# Patient Record
Sex: Male | Born: 1940 | Race: White | Hispanic: No | Marital: Married | Smoking: Former smoker
Health system: Southern US, Community
[De-identification: ages and names within clinical notes are randomized; demographics above are authoritative.]

## PROBLEM LIST (undated history)

## (undated) DIAGNOSIS — I499 Cardiac arrhythmia, unspecified: Secondary | ICD-10-CM

## (undated) DIAGNOSIS — E785 Hyperlipidemia, unspecified: Secondary | ICD-10-CM

## (undated) DIAGNOSIS — G4730 Sleep apnea, unspecified: Secondary | ICD-10-CM

## (undated) DIAGNOSIS — N400 Enlarged prostate without lower urinary tract symptoms: Secondary | ICD-10-CM

## (undated) DIAGNOSIS — Z8709 Personal history of other diseases of the respiratory system: Secondary | ICD-10-CM

## (undated) DIAGNOSIS — E119 Type 2 diabetes mellitus without complications: Secondary | ICD-10-CM

## (undated) DIAGNOSIS — R51 Headache: Secondary | ICD-10-CM

## (undated) DIAGNOSIS — I10 Essential (primary) hypertension: Secondary | ICD-10-CM

## (undated) HISTORY — DX: Hyperlipidemia, unspecified: E78.5

## (undated) HISTORY — PX: KNEE ARTHROSCOPY: SUR90

## (undated) HISTORY — PX: VENTRAL HERNIA REPAIR: SHX424

## (undated) HISTORY — PX: TONSILLECTOMY: SUR1361

## (undated) HISTORY — DX: Essential (primary) hypertension: I10

## (undated) HISTORY — DX: Type 2 diabetes mellitus without complications: E11.9

---

## 1966-03-17 HISTORY — PX: APPENDECTOMY: SHX54

## 1970-03-17 HISTORY — PX: NASOPHARYNGOSCOPY: SHX5210

## 1997-08-09 ENCOUNTER — Ambulatory Visit (HOSPITAL_COMMUNITY): Admission: RE | Admit: 1997-08-09 | Discharge: 1997-08-09 | Payer: Self-pay | Admitting: Interventional Cardiology

## 1997-09-06 ENCOUNTER — Ambulatory Visit (HOSPITAL_COMMUNITY): Admission: RE | Admit: 1997-09-06 | Discharge: 1997-09-06 | Payer: Self-pay | Admitting: Cardiovascular Disease

## 1997-09-06 HISTORY — PX: CARDIAC CATHETERIZATION: SHX172

## 1998-07-11 ENCOUNTER — Encounter: Payer: Self-pay | Admitting: Urology

## 1998-07-11 ENCOUNTER — Ambulatory Visit (HOSPITAL_COMMUNITY): Admission: RE | Admit: 1998-07-11 | Discharge: 1998-07-11 | Payer: Self-pay | Admitting: Urology

## 2004-04-01 ENCOUNTER — Encounter: Admission: RE | Admit: 2004-04-01 | Discharge: 2004-04-01 | Payer: Self-pay | Admitting: General Surgery

## 2004-04-02 ENCOUNTER — Ambulatory Visit (HOSPITAL_COMMUNITY): Admission: RE | Admit: 2004-04-02 | Discharge: 2004-04-02 | Payer: Self-pay | Admitting: General Surgery

## 2004-04-02 ENCOUNTER — Encounter (INDEPENDENT_AMBULATORY_CARE_PROVIDER_SITE_OTHER): Payer: Self-pay | Admitting: *Deleted

## 2004-04-02 ENCOUNTER — Ambulatory Visit (HOSPITAL_BASED_OUTPATIENT_CLINIC_OR_DEPARTMENT_OTHER): Admission: RE | Admit: 2004-04-02 | Discharge: 2004-04-02 | Payer: Self-pay | Admitting: General Surgery

## 2005-02-19 ENCOUNTER — Ambulatory Visit (HOSPITAL_BASED_OUTPATIENT_CLINIC_OR_DEPARTMENT_OTHER): Admission: RE | Admit: 2005-02-19 | Discharge: 2005-02-19 | Payer: Self-pay | Admitting: Plastic Surgery

## 2005-02-19 ENCOUNTER — Encounter (INDEPENDENT_AMBULATORY_CARE_PROVIDER_SITE_OTHER): Payer: Self-pay | Admitting: *Deleted

## 2005-10-29 ENCOUNTER — Encounter: Admission: RE | Admit: 2005-10-29 | Discharge: 2005-10-29 | Payer: Self-pay | Admitting: Gastroenterology

## 2006-03-17 HISTORY — PX: NM MYOCAR PERF WALL MOTION: HXRAD629

## 2006-03-17 HISTORY — PX: TRANSTHORACIC ECHOCARDIOGRAM: SHX275

## 2007-10-14 ENCOUNTER — Encounter: Admission: RE | Admit: 2007-10-14 | Discharge: 2007-10-14 | Payer: Self-pay | Admitting: Orthopedic Surgery

## 2010-08-02 NOTE — Op Note (Signed)
[name], [name] NO.:  [id number]   MEDICAL RECORD NO.:  [id number]          PATIENT TYPE:  AMB   LOCATION:  DSC                          FACILITY:  [facility]   PHYSICIAN:  Anselm Pancoast. Weatherly, M.D.DATE OF BIRTH:  10/19/1940   DATE OF PROCEDURE:  04/02/2004  DATE OF DISCHARGE:                                 OPERATIVE REPORT   PREOPERATIVE DIAGNOSIS:  Ventral hernia.   OPERATION:  Repair of ventral hernia with mesh, preperitoneal space.  General anesthesia.   SURGEON:  Anselm Pancoast. Zachery Dakins, M.D.   HISTORY:  Victor Sanchez is a 70 year old male who for probably about five or six  years has had a progressive bulge above the umbilicus.  He has had a  previous umbilical hernia repaired years ago, and I have seen him, and we  have had scheduled several times, but for weddings and other personal  reasons he has postponed.  The hernia has gradually gotten larger, so now it  is an orange-size mass above the umbilicus.  He weighs about 240 pounds.  He  had a previous colonoscopy, and I recommended that we repair this with a  vertical incision, a piece of Prolene mesh placed within the preperitoneal  space, and he is in agreement.   Preoperatively, he was given 1 g of Kefzol and taken to the operative suite.  Induction of general anesthesia, endotracheal tube.  The abdomen was clipped  and then prepped with Betadine surgical scrub and solution and draped in a  sterile manner.  A vertical incision about 4 inches in length was made.  The  hernia sac was up into the subcutaneous tissue, and I separated the hernia  sac from the surrounding adipose tissue circumferentially.  The hernia was  kind of going to the right side, but the actual defect is midline, located  starting about an inch above the umbilicus.  I opened the hernia sac, and it  was kind of a narrow neck.  It did have colon up in it and also a large wad  of omentum, and I reduced part of it, but some of the  omentum that was very  boggy I just removed it.  The little pedicles were tied with 2-0 Vicryl.  Part of the omentum where I removed went very close to the transverse colon  that was kind of up into the hernia sac, and this was placed back and  carefully handled when the little pedicles were ligated.  Next, the  peritoneum was closed, and this was with 2-0 Vicryl.  The fascia at the  midline is just very thinned out, and I developed a plane between the fascia  and the peritoneum.  It was necessary to take the mesh up below the  umbilicus just so I had a good anchor, and I took down where I had actually  closed the peritoneum during the herniorrhaphy years ago so that the mesh  could go down below the umbilicus with the lower sutures.  A piece of mesh  about 3 x about 5 inches in length was  used, and I anchored it in both  superiorly and inferiorly and laterally about every 2 inches with one  Novofil suture using a bigger needle so I could go through the abdominal  wall but actually using it in the subcutaneous, kind of creating a little  pocket, creating the little area to operate through, picking up the mesh,  going back and tying the knots at the end.  The mesh was lying flat,  anchored.  I had opened the peritoneum just a little bit at the umbilicus,  and all of the actual peritoneum areas had been closed so that there was  good peritoneum between the intestines and the patient's mesh.  Next, the  ventral area was closed with interrupted #1 Novofil, picking up just a  little bit of the midline mesh to anchor the area completely.  There was  good hemostasis.  I had washed out, and also put about 20 cc of 0.5%  Marcaine in the abdominal wall to minimize the immediate postoperative pain.  The subcutaneous tissue was closed with 3-0 Vicryl, and then the skin was  closed with staples.  The patient tolerated the procedure nicely.  I did  place an abdominal binder on him, and I will see him   back in the office in the early part of next week to remove the skin  staples.  He will be advised not to do any real strenuous activity for  approximately three or four weeks, hopefully can be driving in three or four  days, and will use Tylox for pain.       WJW/MEDQ  D:  04/02/2004  T:  04/02/2004  Job:  [id number]   cc:   Anselm Pancoast. Zachery Dakins, M.D.  [address], [address]  [place]  [place] [address]  Fax: [phone]   Tasia Catchings, M.D.  [address]  [place]  [place] [address]  Fax: [phone]

## 2011-02-26 ENCOUNTER — Other Ambulatory Visit: Payer: Self-pay | Admitting: Dermatology

## 2011-06-10 ENCOUNTER — Encounter (HOSPITAL_BASED_OUTPATIENT_CLINIC_OR_DEPARTMENT_OTHER): Payer: Self-pay

## 2011-06-25 ENCOUNTER — Ambulatory Visit (HOSPITAL_BASED_OUTPATIENT_CLINIC_OR_DEPARTMENT_OTHER): Payer: BC Managed Care – PPO | Attending: Otolaryngology | Admitting: Radiology

## 2011-06-25 VITALS — Ht 73.0 in | Wt 225.0 lb

## 2011-06-25 DIAGNOSIS — G4733 Obstructive sleep apnea (adult) (pediatric): Secondary | ICD-10-CM | POA: Insufficient documentation

## 2011-06-25 DIAGNOSIS — R0683 Snoring: Secondary | ICD-10-CM

## 2011-06-25 DIAGNOSIS — J342 Deviated nasal septum: Secondary | ICD-10-CM

## 2011-06-29 DIAGNOSIS — G4733 Obstructive sleep apnea (adult) (pediatric): Secondary | ICD-10-CM

## 2011-06-29 NOTE — Procedures (Signed)
Victor Sanchez, STICK NO.:  [id number]  MEDICAL RECORD NO.:  [id number]          PATIENT TYPE:  OUT  LOCATION:  SLEEP CENTER                 FACILITY:  [facility]  PHYSICIAN:  Maxwell Lemen D. Maple Hudson, MD, FCCP, FACPDATE OF BIRTH:  [birth year] 02, 1940  DATE OF STUDY:  06/25/2011                           NOCTURNAL POLYSOMNOGRAM  REFERRING PHYSICIAN:  Zola Button T. Lazarus Salines, M.D.  REFERRING DOCTOR:  Zola Button T. Wolicki, MD.  INDICATION FOR STUDY:  Hypersomnia with sleep apnea.  EPWORTH SLEEPINESS SCORE:  4/24.  BMI 29.7, weight 225 pounds, height 73 inches, neck 17.5 inches.  MEDICATIONS:  Charted and reviewed.  SLEEP ARCHITECTURE:  Split study protocol.  During the diagnostic phase, total sleep time 131 minute with sleep efficiency 69.9%.  Stage I was 13.7%, stage II 76%, stage III absent, REM 10.3% of total sleep time. Sleep latency 35.5 minutes, REM latency 131.5 minutes, wake after sleep onset 18.5 minutes, arousal index 22.4.  BEDTIME MEDICATION:  Advil PM.  RESPIRATORY DATA:  Split study protocol.  Apnea/hypopnea index (AHI) 21.1 per hour.  A total of 46 events were scored including 5 obstructive apneas and 41 hypopneas.  Events were associated with nonsupine sleep position.  REM AHI 31.1 per hour.  CPAP was titrated to 6 CWP, AHI 1 per hour.  He wore a large ResMed Mirage Quattro full-face mask with heated humidifier and a C-flex setting of 3.  OXYGEN DATA:  Mild snoring before CPAP with oxygen desaturation to a nadir of 80% on room air.  With CPAP titration, snoring was prevented and mean oxygen saturation held 94.7% on room air.  CARDIAC DATA:  Sinus rhythm with PACs and PVCs.  MOVEMENT-PARASOMNIA:  No significant movement disturbance. Bathroom x2.  IMPRESSIONS-RECOMMENDATIONS: 1. Moderate obstructive sleep apnea/hypopnea syndrome, apnea/hypopnea     index 21.1 per hour with nonsupine events.  Mild snoring with     oxygen desaturation to a nadir of 80% on room air. 2.  Successful continuous positive airway pressure titration to 6     centimeters of water pressure, apnea/hypopnea     index 1 per hour.  He wore a large ResMed Mirage Quattro full-face     mask with heated humidifier and a C-flex setting of 3.  Snoring was     prevented and mean oxygen saturation held 94.7% on room air.     Symone Cornman D. Maple Hudson, MD, [facility], FACP Diplomate, American Board of Sleep Medicine    CDY/MEDQ  D:  06/29/2011 09:03:00  T:  06/29/2011 09:25:08  Job:  [id number]

## 2012-06-01 ENCOUNTER — Other Ambulatory Visit: Payer: Self-pay | Admitting: Dermatology

## 2012-12-30 ENCOUNTER — Other Ambulatory Visit: Payer: Self-pay | Admitting: Gastroenterology

## 2013-05-12 ENCOUNTER — Encounter (HOSPITAL_COMMUNITY): Payer: Self-pay | Admitting: Pharmacy Technician

## 2013-05-17 ENCOUNTER — Ambulatory Visit (INDEPENDENT_AMBULATORY_CARE_PROVIDER_SITE_OTHER): Payer: BC Managed Care – PPO | Admitting: *Deleted

## 2013-05-17 ENCOUNTER — Telehealth: Payer: Self-pay | Admitting: Cardiovascular Disease

## 2013-05-17 ENCOUNTER — Other Ambulatory Visit (HOSPITAL_COMMUNITY): Payer: BC Managed Care – PPO

## 2013-05-17 VITALS — HR 70

## 2013-05-17 DIAGNOSIS — I499 Cardiac arrhythmia, unspecified: Secondary | ICD-10-CM

## 2013-05-17 NOTE — Patient Instructions (Signed)
Dr Royann Shiversroitoru recommends that you schedule a follow-up appointment with Dr Tresa EndoKelly.

## 2013-05-17 NOTE — Telephone Encounter (Signed)
Cardiac rhythm was abnormal on exam when he saw Dr. Lazarus SalinesWolicki. Normal ECG in office today. Consider f/u with Dr. Tresa EndoKelly, maybe event monitor.

## 2013-05-18 ENCOUNTER — Encounter (HOSPITAL_COMMUNITY): Payer: Self-pay

## 2013-05-18 ENCOUNTER — Encounter (HOSPITAL_COMMUNITY)
Admission: RE | Admit: 2013-05-18 | Discharge: 2013-05-18 | Disposition: A | Discharge status: Home / Self Care | Payer: BC Managed Care – PPO | Source: Ambulatory Visit | Attending: Otolaryngology | Admitting: Otolaryngology

## 2013-05-18 DIAGNOSIS — Z01818 Encounter for other preprocedural examination: Secondary | ICD-10-CM | POA: Insufficient documentation

## 2013-05-18 HISTORY — DX: Personal history of other diseases of the respiratory system: Z87.09

## 2013-05-18 HISTORY — DX: Headache: R51

## 2013-05-18 HISTORY — DX: Sleep apnea, unspecified: G47.30

## 2013-05-18 HISTORY — DX: Benign prostatic hyperplasia without lower urinary tract symptoms: N40.0

## 2013-05-18 LAB — COMPREHENSIVE METABOLIC PANEL
ALT: 48 U/L (ref 0–53)
AST: 30 U/L (ref 0–37)
Albumin: 3.8 g/dL (ref 3.5–5.2)
Alkaline Phosphatase: 52 U/L (ref 39–117)
BUN: 17 mg/dL (ref 6–23)
CO2: 24 mEq/L (ref 19–32)
Calcium: 9.4 mg/dL (ref 8.4–10.5)
Chloride: 99 mEq/L (ref 96–112)
Creatinine, Ser: 0.76 mg/dL (ref 0.50–1.35)
GFR calc Af Amer: >90 mL/min (ref >90)
GFR calc non Af Amer: 89 mL/min — ABNORMAL LOW (ref >90)
Glucose, Bld: 202 mg/dL — ABNORMAL HIGH (ref 70–99)
POTASSIUM: 4.6 meq/L (ref 3.7–5.3)
SODIUM: 138 meq/L (ref 137–147)
TOTAL PROTEIN: 7.4 g/dL (ref 6.0–8.3)
Total Bilirubin: 0.3 mg/dL (ref 0.3–1.2)

## 2013-05-18 LAB — CBC
HCT: 42.4 % (ref 39.0–52.0)
Hemoglobin: 14.9 g/dL (ref 13.0–17.0)
MCH: 30.7 pg (ref 26.0–34.0)
MCHC: 35.1 g/dL (ref 30.0–36.0)
MCV: 87.4 fL (ref 78.0–100.0)
Platelets: 235 10*3/uL (ref 150–400)
RBC: 4.85 MIL/uL (ref 4.22–5.81)
RDW: 13.7 % (ref 11.5–15.5)
WBC: 8.5 10*3/uL (ref 4.0–10.5)

## 2013-05-18 NOTE — Pre-Procedure Instructions (Signed)
Victor PhenixJohn E Maslanka  05/18/2013   Your procedure is scheduled on:  Wednesday, March 11th   [facility] 3 at  6:30  AM.  Call this number if you have problems the morning of surgery: [phone]   Remember:   Do not eat food or drink liquids after midnight Tuesday.   Take these medicines the morning of surgery with A SIP OF WATER: Flomax   Do not wear jewelry-no rings or watches.  Do not wear lotions or colognes.  You may NOT wear deodorant.   Men may shave face and neck.   Do not bring valuables to the hospital.  [facility] any belongings or valuables.               Contacts, dentures or bridgework may not be worn into surgery.  Leave suitcase in the car. After surgery it may be brought to your room.  For patients admitted to the hospital, discharge time is determined by your treatment team.              Name and phone number of your driver:    Special Instructions:  "Preparing for Surgery Instructions"   Please read over the following fact sheets that you were given: Pain Booklet, Coughing and Deep Breathing and Surgical Site Infection Prevention

## 2013-05-18 NOTE — Telephone Encounter (Signed)
Patient notified yesterday of EKG done in the office.  Appt w/Dr Tresa EndoKelly 4/15 and will call if any problems between now and then.  Patient voiced understanding.

## 2013-05-18 NOTE — Progress Notes (Signed)
Left message with Amy at Dr. Raye SorrowWolicki's office and requested orders.

## 2013-05-25 ENCOUNTER — Encounter (HOSPITAL_COMMUNITY): Payer: BC Managed Care – PPO | Admitting: Anesthesiology

## 2013-05-25 ENCOUNTER — Encounter (HOSPITAL_COMMUNITY): Payer: Self-pay | Admitting: *Deleted

## 2013-05-25 ENCOUNTER — Observation Stay (HOSPITAL_COMMUNITY)
Admission: RE | Admit: 2013-05-25 | Discharge: 2013-05-26 | Disposition: A | Discharge status: Home / Self Care | Payer: BC Managed Care – PPO | Source: Ambulatory Visit | Attending: Otolaryngology | Admitting: Otolaryngology

## 2013-05-25 ENCOUNTER — Encounter (HOSPITAL_COMMUNITY)
Admission: RE | Disposition: A | Discharge status: Home / Self Care | Payer: Self-pay | Source: Ambulatory Visit | Attending: Otolaryngology

## 2013-05-25 ENCOUNTER — Other Ambulatory Visit: Payer: Self-pay | Admitting: Otolaryngology

## 2013-05-25 ENCOUNTER — Ambulatory Visit (HOSPITAL_COMMUNITY): Payer: BC Managed Care – PPO | Admitting: Anesthesiology

## 2013-05-25 DIAGNOSIS — J45909 Unspecified asthma, uncomplicated: Secondary | ICD-10-CM | POA: Insufficient documentation

## 2013-05-25 DIAGNOSIS — J342 Deviated nasal septum: Principal | ICD-10-CM | POA: Diagnosis present

## 2013-05-25 DIAGNOSIS — Z87891 Personal history of nicotine dependence: Secondary | ICD-10-CM | POA: Insufficient documentation

## 2013-05-25 DIAGNOSIS — J343 Hypertrophy of nasal turbinates: Secondary | ICD-10-CM | POA: Insufficient documentation

## 2013-05-25 DIAGNOSIS — R51 Headache: Secondary | ICD-10-CM | POA: Insufficient documentation

## 2013-05-25 DIAGNOSIS — G4733 Obstructive sleep apnea (adult) (pediatric): Secondary | ICD-10-CM | POA: Diagnosis present

## 2013-05-25 HISTORY — PX: NASAL SEPTOPLASTY W/ TURBINOPLASTY: SHX2070

## 2013-05-25 HISTORY — PX: UVULECTOMY: SHX2631

## 2013-05-25 SURGERY — SEPTOPLASTY, NOSE, WITH NASAL TURBINATE REDUCTION
Anesthesia: General | Site: Nose

## 2013-05-25 MED ORDER — LIDOCAINE-EPINEPHRINE 1 %-1:100000 IJ SOLN
INTRAMUSCULAR | Status: AC
  Filled 2013-05-25: qty 1

## 2013-05-25 MED ORDER — 0.9 % SODIUM CHLORIDE (POUR BTL) OPTIME
TOPICAL | Status: DC | PRN
  Administered 2013-05-25: 1000 mL

## 2013-05-25 MED ORDER — BACITRACIN ZINC 500 UNIT/GM EX OINT
TOPICAL_OINTMENT | CUTANEOUS | Status: AC
  Filled 2013-05-25: qty 15

## 2013-05-25 MED ORDER — BACITRACIN ZINC 500 UNIT/GM EX OINT
1.0000 "application " | TOPICAL_OINTMENT | Freq: Three times a day (TID) | CUTANEOUS | Status: DC
  Filled 2013-05-25: qty 28.35

## 2013-05-25 MED ORDER — EPHEDRINE SULFATE 50 MG/ML IJ SOLN
INTRAMUSCULAR | Status: AC
  Filled 2013-05-25: qty 1

## 2013-05-25 MED ORDER — LIDOCAINE HCL (CARDIAC) 20 MG/ML IV SOLN
INTRAVENOUS | Status: AC
  Filled 2013-05-25: qty 5

## 2013-05-25 MED ORDER — NEOSTIGMINE METHYLSULFATE 1 MG/ML IJ SOLN
INTRAMUSCULAR | Status: AC
  Filled 2013-05-25: qty 10

## 2013-05-25 MED ORDER — ALBUMIN HUMAN 5 % IV SOLN
INTRAVENOUS | Status: DC | PRN
  Administered 2013-05-25 (×2): via INTRAVENOUS

## 2013-05-25 MED ORDER — PHENYLEPHRINE 40 MCG/ML (10ML) SYRINGE FOR IV PUSH (FOR BLOOD PRESSURE SUPPORT)
PREFILLED_SYRINGE | INTRAVENOUS | Status: AC
  Filled 2013-05-25: qty 10

## 2013-05-25 MED ORDER — OXYMETAZOLINE HCL 0.05 % NA SOLN
NASAL | Status: DC | PRN
  Administered 2013-05-25: 1 via NASAL

## 2013-05-25 MED ORDER — LIDOCAINE HCL (CARDIAC) 20 MG/ML IV SOLN
INTRAVENOUS | Status: DC | PRN
  Administered 2013-05-25: 60 mg via INTRAVENOUS

## 2013-05-25 MED ORDER — MIDAZOLAM HCL 5 MG/5ML IJ SOLN
INTRAMUSCULAR | Status: DC | PRN
  Administered 2013-05-25 (×2): 1 mg via INTRAVENOUS

## 2013-05-25 MED ORDER — OXYMETAZOLINE HCL 0.05 % NA SOLN
2.0000 | NASAL | Status: AC
  Administered 2013-05-25 (×2): 2 via NASAL
  Filled 2013-05-25: qty 15

## 2013-05-25 MED ORDER — GLYCOPYRROLATE 0.2 MG/ML IJ SOLN
INTRAMUSCULAR | Status: DC | PRN
  Administered 2013-05-25: 0.4 mg via INTRAVENOUS

## 2013-05-25 MED ORDER — DUTASTERIDE 0.5 MG PO CAPS: 0.5000 mg | ORAL_CAPSULE | Freq: Every day | ORAL | Status: DC

## 2013-05-25 MED ORDER — ARTIFICIAL TEARS OP OINT
TOPICAL_OINTMENT | OPHTHALMIC | Status: AC
  Filled 2013-05-25: qty 3.5

## 2013-05-25 MED ORDER — CEFAZOLIN SODIUM 1-5 GM-% IV SOLN
1.0000 g | Freq: Three times a day (TID) | INTRAVENOUS | Status: DC
  Administered 2013-05-25 – 2013-05-26 (×3): 1 g via INTRAVENOUS
  Filled 2013-05-25 (×4): qty 50

## 2013-05-25 MED ORDER — ARTIFICIAL TEARS OP OINT
TOPICAL_OINTMENT | OPHTHALMIC | Status: DC | PRN
  Administered 2013-05-25: 1 via OPHTHALMIC

## 2013-05-25 MED ORDER — MORPHINE SULFATE 2 MG/ML IJ SOLN
INTRAMUSCULAR | Status: AC
  Administered 2013-05-25: 2 mg via INTRAVENOUS
  Filled 2013-05-25: qty 1

## 2013-05-25 MED ORDER — PHENYLEPHRINE HCL 10 MG/ML IJ SOLN
INTRAMUSCULAR | Status: DC | PRN
  Administered 2013-05-25 (×5): 80 ug via INTRAVENOUS

## 2013-05-25 MED ORDER — LIDOCAINE-EPINEPHRINE 1 %-1:100000 IJ SOLN
INTRAMUSCULAR | Status: DC | PRN
  Administered 2013-05-25: 20 mL

## 2013-05-25 MED ORDER — MIDAZOLAM HCL 2 MG/2ML IJ SOLN
INTRAMUSCULAR | Status: AC
  Filled 2013-05-25: qty 2

## 2013-05-25 MED ORDER — STERILE WATER FOR INJECTION IJ SOLN
INTRAMUSCULAR | Status: AC
  Filled 2013-05-25: qty 10

## 2013-05-25 MED ORDER — ONDANSETRON HCL 4 MG/2ML IJ SOLN
INTRAMUSCULAR | Status: DC | PRN
  Administered 2013-05-25: 4 mg via INTRAVENOUS

## 2013-05-25 MED ORDER — FENTANYL CITRATE 0.05 MG/ML IJ SOLN
INTRAMUSCULAR | Status: DC | PRN
  Administered 2013-05-25 (×5): 50 ug via INTRAVENOUS

## 2013-05-25 MED ORDER — ONDANSETRON HCL 4 MG/2ML IJ SOLN
INTRAMUSCULAR | Status: AC
  Filled 2013-05-25: qty 2

## 2013-05-25 MED ORDER — FINASTERIDE 5 MG PO TABS
5.0000 mg | ORAL_TABLET | Freq: Every day | ORAL | Status: DC
  Filled 2013-05-25 (×2): qty 1

## 2013-05-25 MED ORDER — ROCURONIUM BROMIDE 100 MG/10ML IV SOLN
INTRAVENOUS | Status: DC | PRN
Start: 2013-05-25 — End: 2013-05-25
  Administered 2013-05-25: 50 mg via INTRAVENOUS

## 2013-05-25 MED ORDER — OXYMETAZOLINE HCL 0.05 % NA SOLN
NASAL | Status: AC
  Filled 2013-05-25: qty 15

## 2013-05-25 MED ORDER — PROPOFOL 10 MG/ML IV BOLUS
INTRAVENOUS | Status: AC
  Filled 2013-05-25: qty 20

## 2013-05-25 MED ORDER — PROPOFOL 10 MG/ML IV BOLUS
INTRAVENOUS | Status: DC | PRN
  Administered 2013-05-25 (×2): 40 mg via INTRAVENOUS
  Administered 2013-05-25: 200 mg via INTRAVENOUS

## 2013-05-25 MED ORDER — LACTATED RINGERS IV SOLN
INTRAVENOUS | Status: DC | PRN
  Administered 2013-05-25 (×2): via INTRAVENOUS

## 2013-05-25 MED ORDER — GLYCOPYRROLATE 0.2 MG/ML IJ SOLN
INTRAMUSCULAR | Status: AC
  Filled 2013-05-25: qty 3

## 2013-05-25 MED ORDER — ACETAMINOPHEN 160 MG/5ML PO SOLN: 325.0000 mg | ORAL | Status: DC | PRN

## 2013-05-25 MED ORDER — PHENYLEPHRINE HCL 10 MG/ML IJ SOLN
10.0000 mg | INTRAVENOUS | Status: DC | PRN
  Administered 2013-05-25: 30 ug/min via INTRAVENOUS

## 2013-05-25 MED ORDER — FENTANYL CITRATE 0.05 MG/ML IJ SOLN
INTRAMUSCULAR | Status: AC
  Filled 2013-05-25: qty 5

## 2013-05-25 MED ORDER — ONDANSETRON HCL 4 MG/2ML IJ SOLN: 4.0000 mg | INTRAMUSCULAR | Status: DC | PRN

## 2013-05-25 MED ORDER — FENTANYL CITRATE 0.05 MG/ML IJ SOLN
INTRAMUSCULAR | Status: AC
  Filled 2013-05-25: qty 2

## 2013-05-25 MED ORDER — MORPHINE SULFATE 2 MG/ML IJ SOLN
2.0000 mg | INTRAMUSCULAR | Status: DC | PRN
  Administered 2013-05-25 (×2): 2 mg via INTRAVENOUS
  Filled 2013-05-25: qty 1

## 2013-05-25 MED ORDER — LIDOCAINE HCL 4 % MT SOLN
OROMUCOSAL | Status: DC | PRN
  Administered 2013-05-25: 4 mL via TOPICAL

## 2013-05-25 MED ORDER — CEFAZOLIN SODIUM-DEXTROSE 2-3 GM-% IV SOLR
2.0000 g | INTRAVENOUS | Status: AC
Start: 2013-05-25 — End: 2013-05-25
  Administered 2013-05-25: 2 g via INTRAVENOUS
  Filled 2013-05-25: qty 50

## 2013-05-25 MED ORDER — TAMSULOSIN HCL 0.4 MG PO CAPS
0.4000 mg | ORAL_CAPSULE | Freq: Every day | ORAL | Status: DC
  Filled 2013-05-25 (×2): qty 1

## 2013-05-25 MED ORDER — OXYCODONE HCL 5 MG/5ML PO SOLN
5.0000 mg | ORAL | Status: DC | PRN
  Administered 2013-05-25 – 2013-05-26 (×4): 10 mg via ORAL
  Filled 2013-05-25 (×2): qty 10
  Filled 2013-05-25: qty 50
  Filled 2013-05-25: qty 10

## 2013-05-25 MED ORDER — ONDANSETRON HCL 4 MG PO TABS: 4.0000 mg | ORAL_TABLET | ORAL | Status: DC | PRN

## 2013-05-25 MED ORDER — NEOSTIGMINE METHYLSULFATE 1 MG/ML IJ SOLN
INTRAMUSCULAR | Status: DC | PRN
  Administered 2013-05-25: 3 mg via INTRAVENOUS

## 2013-05-25 MED ORDER — FENTANYL CITRATE 0.05 MG/ML IJ SOLN
25.0000 ug | INTRAMUSCULAR | Status: DC | PRN
  Administered 2013-05-25 (×4): 25 ug via INTRAVENOUS
  Administered 2013-05-25: 50 ug via INTRAVENOUS

## 2013-05-25 MED ORDER — DEXAMETHASONE SODIUM PHOSPHATE 4 MG/ML IJ SOLN
INTRAMUSCULAR | Status: AC
  Filled 2013-05-25: qty 1

## 2013-05-25 MED ORDER — BACITRACIN ZINC 500 UNIT/GM EX OINT
TOPICAL_OINTMENT | CUTANEOUS | Status: DC | PRN
  Administered 2013-05-25: 1 via TOPICAL

## 2013-05-25 MED ORDER — SODIUM CHLORIDE 0.45 % IV SOLN
INTRAVENOUS | Status: DC
Start: 2013-05-25 — End: 2013-05-26
  Administered 2013-05-25 (×2): via INTRAVENOUS

## 2013-05-25 SURGICAL SUPPLY — 49 items
ATTRACTOMAT 16X20 MAGNETIC DRP (DRAPES) ×3 IMPLANT
BLADE TRICUT ROTATE M4 4 5PK (BLADE) IMPLANT
CANISTER SUCTION 2500CC (MISCELLANEOUS) ×3 IMPLANT
COAGULATOR SUCT SWTCH 10FR 6 (ELECTROSURGICAL) ×3 IMPLANT
COTTONBALL LRG STERILE PKG (GAUZE/BANDAGES/DRESSINGS) ×3 IMPLANT
CRADLE DONUT ADULT HEAD (MISCELLANEOUS) ×3 IMPLANT
DRESSING TELFA 8X3 (GAUZE/BANDAGES/DRESSINGS) ×3 IMPLANT
DRSG NASOPORE 8CM (GAUZE/BANDAGES/DRESSINGS) IMPLANT
ELECT REM PT RETURN 9FT ADLT (ELECTROSURGICAL) ×3
ELECTRODE REM PT RTRN 9FT ADLT (ELECTROSURGICAL) ×2 IMPLANT
FILTER ARTHROSCOPY CONVERTOR (FILTER) IMPLANT
GAUZE PACKING FOLDED 2  STR (GAUZE/BANDAGES/DRESSINGS) ×1
GAUZE PACKING FOLDED 2 STR (GAUZE/BANDAGES/DRESSINGS) ×2 IMPLANT
GAUZE SPONGE 2X2 8PLY STRL LF (GAUZE/BANDAGES/DRESSINGS) IMPLANT
GEL ULTRASOUND 20GR AQUASONIC (MISCELLANEOUS) IMPLANT
GLOVE BIOGEL PI IND STRL 7.0 (GLOVE) ×2 IMPLANT
GLOVE BIOGEL PI INDICATOR 7.0 (GLOVE) ×1
GLOVE ECLIPSE 8.0 STRL XLNG CF (GLOVE) ×6 IMPLANT
GLOVE SURG SS PI 7.0 STRL IVOR (GLOVE) ×3 IMPLANT
GOWN STRL REUS W/ TWL LRG LVL3 (GOWN DISPOSABLE) ×2 IMPLANT
GOWN STRL REUS W/ TWL XL LVL3 (GOWN DISPOSABLE) ×2 IMPLANT
GOWN STRL REUS W/TWL LRG LVL3 (GOWN DISPOSABLE) ×1
GOWN STRL REUS W/TWL XL LVL3 (GOWN DISPOSABLE) ×1
KIT BASIN OR (CUSTOM PROCEDURE TRAY) ×3 IMPLANT
KIT ROOM TURNOVER OR (KITS) ×3 IMPLANT
NEEDLE HYPO 25GX1X1/2 BEV (NEEDLE) ×3 IMPLANT
NEEDLE SPNL 25GX3.5 QUINCKE BL (NEEDLE) ×3 IMPLANT
NS IRRIG 1000ML POUR BTL (IV SOLUTION) ×3 IMPLANT
PAD ARMBOARD 7.5X6 YLW CONV (MISCELLANEOUS) ×3 IMPLANT
PAD EYE OVAL STERILE LF (GAUZE/BANDAGES/DRESSINGS) ×6 IMPLANT
PATTIES SURGICAL .5 X3 (DISPOSABLE) ×3 IMPLANT
PHARMELAST ×3 IMPLANT
SHEET SIL 040 (INSTRUMENTS) IMPLANT
SPECIMEN JAR SMALL (MISCELLANEOUS) IMPLANT
SPONGE GAUZE 2X2 STER 10/PKG (GAUZE/BANDAGES/DRESSINGS)
SPONGE GAUZE 4X4 12PLY (GAUZE/BANDAGES/DRESSINGS) ×3 IMPLANT
SUT CHROMIC 4 0 P 3 18 (SUTURE) ×3 IMPLANT
SUT ETHILON 3 0 PS 1 (SUTURE) ×3 IMPLANT
SUT PDS AB 4-0 P3 18 (SUTURE) ×3 IMPLANT
SUT PLAIN 4 0 ~~LOC~~ 1 (SUTURE) IMPLANT
SUT VIC AB 3-0 FS2 27 (SUTURE) ×6 IMPLANT
TAPE SURG TRANSPORE 1 IN (GAUZE/BANDAGES/DRESSINGS) ×2 IMPLANT
TAPE SURGICAL TRANSPORE 1 IN (GAUZE/BANDAGES/DRESSINGS) ×1
TOWEL OR 17X24 6PK STRL BLUE (TOWEL DISPOSABLE) ×3 IMPLANT
TOWEL OR 17X26 10 PK STRL BLUE (TOWEL DISPOSABLE) ×3 IMPLANT
TRAY ENT MC OR (CUSTOM PROCEDURE TRAY) ×3 IMPLANT
TUBE CONNECTING 12X1/4 (SUCTIONS) ×3 IMPLANT
VACUUM HOSE 7/8X10 W/ WAND (MISCELLANEOUS) ×3 IMPLANT
WATER STERILE IRR 1000ML POUR (IV SOLUTION) IMPLANT

## 2013-05-25 NOTE — Progress Notes (Signed)
This note also relates to the following rows which could not be included: Pulse Rate - Cannot attach notes to unvalidated device data ECG Heart Rate - Cannot attach notes to unvalidated device data Resp - Cannot attach notes to unvalidated device data SpO2 - Cannot attach notes to    NS rinse completed

## 2013-05-25 NOTE — Progress Notes (Signed)
NS rinse completed

## 2013-05-25 NOTE — Transfer of Care (Signed)
Immediate Anesthesia Transfer of Care Note  Patient: Arley PhenixJohn E Muratore  Procedure(s) Performed: Procedure(s): NASAL SEPTOPLASTY WITH TURBINATE REDUCTION (Bilateral) UVULOPLASTY, LASER ASSISTED (N/A)  Patient Location: PACU  Anesthesia Type:General  Level of Consciousness: awake, alert  and oriented  Airway & Oxygen Therapy: Patient Spontanous Breathing and Patient connected to face mask oxygen  Post-op Assessment: Report given to PACU RN, Post -op Vital signs reviewed and stable and Patient moving all extremities X 4  Post vital signs: Reviewed and stable  Complications: No apparent anesthesia complications

## 2013-05-25 NOTE — Discharge Instructions (Signed)
Nasal Surgery Care After These instructions give you information on caring for yourself after your procedure. Your doctor may also give you more specific instructions. Call your doctor if you have any problems or questions after your procedure. HOME CARE  Sleep with your head up on 2 or 3 pillows.  Hold a cold, damp wash cloth to your face, eyes, and nose for 20 minutes, every 2 hours during the day.  Wash your face very gently with mild soap and water 2 times a day.  Change your nasal drip pad as often as you need to.  Do not bend over or lift more than 10 pounds (about the weight of a gallon of milk).  Do not chew gum or eat any food that needs a lot of chewing.  Do not bump or hurt your nose.  Do not blow your nose for 1 week.  Do not sniff or sneeze unless your mouth is open.  Be sure to keep your follow-up appointment. GET HELP RIGHT AWAY IF:   You have any questions or concerns.  You or your child has a temperature by mouth about 102 F (38.9 C).  You notice bad smelling fluid (drainage) coming from the surgery site (incision) or your nose. MAKE SURE YOU:  Understand these instructions.  Will watch your condition.  Will get help right away if you are not doing well or get worse. Document Released: 05/30/2008 Document Revised: 05/26/2011 Document Reviewed: 05/30/2008 [facility] Patient Information 2014 [employer].  Rinse your throat as often as desired with cool dilute salt water.   Advance diet as comfortable Sleep with your head elevated for 3-4 nights. Keep your nose very moist with saline nasal spray.  See our office nasal hygiene instructions. Call for bleeding, any breathing difficulty, excessive pain (Dr. Lazarus SalinesWolicki [phone])

## 2013-05-25 NOTE — Progress Notes (Signed)
05/25/2013 7:59 PM  Hassan BucklerPope, Desjuan [id number]  Post-Op Check   Temp:  [97.9 F (36.6 C)-99.2 F (37.3 C)] 98.3 F (36.8 C) (03/11 1551) Pulse Rate:  [77-86] 85 (03/11 1551) Resp:  [12-25] 15 (03/11 1551) BP: (131-156)/(58-80) 148/72 mmHg (03/11 1551) SpO2:  [92 %-99 %] 94 % (03/11 1551) Weight:  [115.5 kg (254 lb 10.1 oz)] 115.5 kg (254 lb 10.1 oz) (03/11 1406),     Intake/Output Summary (Last 24 hours) at 05/25/13 1959 Last data filed at 05/25/13 1936  Gross per 24 hour  Intake   3695 ml  Output    800 ml  Net   2895 ml    No results found for this or any previous visit (from the past 24 hour(s)).  SUBJECTIVE:  Pain with swallow, but otherwise basically OK.  No SOB, chest pain, N,V.  Spont void.  Holding down clear liquids.  No bleeding  OBJECTIVE:  Mild palatal edema.  Nasal packs secure  IMPRESSION:  Satisfactory check  PLAN:  Tubes and packs out in AM.  Probably discharge home at that time.  Flo ShanksWOLICKI, Janely Gullickson

## 2013-05-25 NOTE — Op Note (Signed)
05/25/2013  10:56 AM    Victor Sanchez  [id number]   Pre-Op Dx:  Deviated Nasal Septum, Hypertrophic Inferior Turbinates, Snoring with moderate sleep apnea  Post-op Dx: Same  Proc: Nasal Septoplasty, Bilateral SMR Inferior Turbinates , LAUP  Surg:  Flo Shanks T MD  Anes:  GOT  EBL:  min  Comp:  none  Findings:  RIGHTward septal deviation.  Bulky inferior turbinates, LEFT>RIGHT.  Scarring c/w prior T&A.  Long thin soft palate with thick uvula.  Procedure: With the patient in a comfortable supine position,  general orotracheal anesthesia was induced without difficulty.     The patient received preoperative Afrin spray for topical decongestion and vasoconstriction.  Intravenous prophylactic antibiotics were administered.  At an appropriate level, the patient was placed in a semi-sitting position.  A saline moistened throat pack was placed.  Nasal vibrissae were trimmed.  Afrin  solution was applied on 0.5" x 3" cottonoids to both sides of the septal mucosa.   1% Xylocaine with 1:100,000 epinephrine, 14 cc's, was infiltrated into the anterior floor of the nose, into the nasal spine region, into the membranous columella, and finally into the submucoperichondrial plane of the septum on both sides.  Also into the anterior pole of the inferior turbinates.  Several minutes were allowed for this to take effect.  A sterile preparation and draping of the midface was accomplished in the standard fashion.  The materials were removed from the nose and observed to be intact and correct in number.  The nose was inspected with a headlight with the findings as described above.  A LEFT hemitransfixion incision was sharply executed and carried down to the caudal edge of the quadrangular cartilage and continued to a floor incision.  An opposite small floor incision was sharply executed as well.   Floor tunnels were elevated on both sides, carried posteriorly, then medially, then brought forward along  the vomer and maxillary crest.  The submucoperichondrial plane of the  LEFT septum was dissected up to the dorsum of the nose, back onto the perpendicular plate, and brought down and communicated with a floor tunnel and then forward along the maxillary crest.  The flap was generated intact.  The chondroethmoid junction was identified and opened with a Risk analyst.  The opposite submucoperiosteal plane of the perpendicular plate of the ethmoid  was elevated and carried down to the floor tunnel posteriorly.  The superior perpendicular plate was lysed with an open Jansen-Middleton forceps.  The inferior portion was dissected from the maxillary crest and vomer with a Cottle elevator.  The midportion was rocked free with a closed [employer] forceps and then delivered.  The tail of the quadrangular cartilage along the vomer was submucosally resected. A 2 mm inferior strip along the bottom of the caudal strut was resected including spurring. The maxillary crest posterior to the caudal strut was lowered with a mallet and osteotome.    An intracrural pocket in the columella was generated. Using a 4-0 PDS suture. The tip of the quadrangular cartilage was tucked into the pocket and secured to the external skin.    After mobilizing the septum adequately, and straightening it in the standard fashion,  The septum was secured to the nasal spine with a figure-of-eight 4-0 PDS suture.  A good straight midline configuration of the septum with good dorsal support was generated.  The septal tunnel was suctioned clear.  Hemostasis was observed.  The flaps were laid back down.  The incisions were closed with  interrupted 4-0 chromic suture.  The septum was noted to spring to the right.  It was sharply separated from the upper lateral cartilages transmucosally in the dorsum of the nose.    Just prior to completing the septoplasty, the inferior turbinates were each infiltrated with additional 1% Xylocaine with  1:100,000 epinephrine,  6 cc's total.  Upon completing the septoplasty, beginning on the RIGHT side, the inferior turbinate was inspected and infractured.  The anterior hood of the inferior turbinate was sharply lysed just behind the nasal valve.  The medial mucosa of the inferior turbinate was incised in an  anterior upsloping fashion and a laterally based flap was developed from the turbinate bone.  Using angled turbinate scissors, turbinate bone and lateral mucosa were resected in a posterior downsloping fashion, taking much of the anterior pole and leaving most of the posterior pole.  Bony spicules were submucosally dissected and removed.  The mucosal flap was laid back down and the turbinate was outfractured.  This completed one SMR inferior turbinate.  The opposite side was performed in identical fashion.  The cut mucosal edges in the posterior pole of the inferior turbinates were controlled with suction cautery.  Again hemostasis was observed.  After completing both turbinate resections, 0.030" reinforced Silastic splints were fashioned, placed against the nasal septum for support, and secured thereto with a 3-0 Ethilon stitch.   Telfa packs impregnated with bacitracin ointment were placed between the septum and the inferior turbinates, one on each side, for hemostasis and support.  A 6.5 mm nasal trumpet was shortened and placed on each side of the nose to allow some breathing and to assist with the nasal packing. Hemostasis was observed.  At this point the nasal portion of the procedure was completed. The pharynx was suctioned clear and the throat pack was removed. The patient was flattened out the table was turned 90 and the patient placed in Trendelenburg. A clean preparation and draping was adjusted. The endotracheal tube was adjusted.  Taking care to protect lips, teeth, and endotracheal tube, the Crowe Davis mouth gag was introduced, expanded for visualization, and suspended from the Mayo  stand in the standard fashion. Endotracheal tube was well protected by the gag. The findings were as described above, including a palatal tattoo mark placed in the office.  The laser had been previously tested for aim and function. Saline saturated eye pads were placed on the patient, and the field was draped out in saline saturated towels. All personnel in the room had protective eye gear.  Using the laser in a 20 W continuous mode, angled incisions were made through the palate to slightly above the tattoo on both sides. A small crescent of posterior tonsil pillar was removed on each side. The tip of the uvula was removed leaving a posterior mucosal flap to bring forward. The tip and lateral surfaces of the uvula were reapproximated with 4-0 Vicryl suture. The posterior tonsillar pillar was reapproximated with the same. Hemostasis was observed.  The mouthgag was relaxed for several minutes. Upon reexpansion, hemostasis was persistent. The mouthgag was relaxed and removed. Dental status was intact.    At this point the procedure was completed.  The patient was cleaned and a drip pad applied.  The patient was returned to anesthesia, awakened, extubated, and transferred to recovery in stable condition.  Dispo:   PACU to home  Plan: Ice, elevation, narcotic analgesia, prophylactic antibiotics for the duration of indwelling nasal foreign bodies.  We will remove the nasal  packing In one day, the septal splints in 10 days.  Return to work or school in 10 days, strenuous activities in two weeks.  Cephus RicherWOLICKI,  Malynda Smolinski T MD

## 2013-05-25 NOTE — Progress Notes (Signed)
First cool NS mouth rinse done.

## 2013-05-25 NOTE — H&P (Signed)
  Victor Sanchez,  Victor Sanchez 73 y.o., male [id number]     Chief Complaint: Nasal obstruction, snoring, sleep apnea  HPI: 73 yo wm with long hx snoring.  NPSG shows AHI 21/hr.  Did not tolerate CPAP.  Notes daytime nasal obstruction.  Snoring disturbs his wife.  Recent eval shows no cardiac arrhythmia.  Distant T&A.    PMH: Past Medical History  Diagnosis Date  . Enlarged prostate   . History of asthma     as a child  . Headache(784.0)     occ.  . Sleep apnea     2013    Surg Hx: Past Surgical History  Procedure Laterality Date  . Cardiac catheterization      ~ 15 years ago  . Ventral hernia repair      x 2  . Tonsillectomy      as a child  . Appendectomy  1968  . Knee arthroscopy  1990'    with baker's cyst excision unsure side  . Nasopharyngoscopy  1972    cyst removal    FHx:  No family history on file. SocHx:  reports that he quit smoking about 46 years ago. He has never used smokeless tobacco. He reports that he drinks about 4.2 ounces of alcohol per week. He reports that he does not use illicit drugs.  ALLERGIES: No Known Allergies   (Not in a hospital admission)  No results found for this or any previous visit (from the past 48 hour(s)). No results found.  ROS:No SOB, chest pain.  Good exercise tolerance.  No difficulty with urination or defecation.  No significant weight changes.  No symptoms of hypothyroidism.  No hx MI or CVA.  There were no vitals taken for this visit.  PHYSICAL EXAM: Overall appearance:  Tall, stocky, slightly overweight.  Mental status intact. Head:  NCAT Ears:  clear Nose:  Severe RIGHT DNS. Oral Cavity:mildly bulky tongue Oral Pharynx/Hypopharynx/Larynx:  Long thick palate/uvula Neuro:  Grossly intact and symmetric Neck:  Muscular Lungs:  Clear to aeration Heart:  RRR with occ. PVC.  No murmurs Abd:  Soft, active Ext:  Nl config.   Assessment/Plan Deviated nasal septum.  Hypertrophic inferior turbinates.  OSA  For septoplasty, SMR  inferior turbinates, LAUP.    Rx's:  Oxycodone, Keflex  Flo ShanksWOLICKI, Jemarcus Dougal 05/25/2013, 7:11 AM

## 2013-05-25 NOTE — Anesthesia Procedure Notes (Signed)
Procedure Name: Intubation Date/Time: 05/25/2013 8:41 AM Performed by: Erik Obey Pre-anesthesia Checklist: Patient being monitored, Patient identified, Emergency Drugs available, Timeout performed and Suction available Patient Re-evaluated:Patient Re-evaluated prior to inductionOxygen Delivery Method: Circle system utilized Preoxygenation: Pre-oxygenation with 100% oxygen Intubation Type: IV induction Ventilation: Mask ventilation without difficulty and Oral airway inserted - appropriate to patient size Laryngoscope Size: Mac and 4 Grade View: Grade I Tube type: Oral Tube size: 7.5 mm Number of attempts: 1 Airway Equipment and Method: Stylet and LTA kit utilized Placement Confirmation: ETT inserted through vocal cords under direct vision,  positive ETCO2 and breath sounds checked- equal and bilateral Secured at: 22 cm Tube secured with: Tape Dental Injury: Teeth and Oropharynx as per pre-operative assessment

## 2013-05-25 NOTE — Anesthesia Preprocedure Evaluation (Addendum)
Anesthesia Evaluation  Patient identified by MRN, date of birth, ID band Patient awake    Reviewed: Allergy & Precautions, H&P , NPO status , Patient's Chart, lab work & pertinent test results  History of Anesthesia Complications Negative for: history of anesthetic complications  Airway Mallampati: II TM Distance: >3 FB Neck ROM: Full    Dental  (+) Teeth Intact, Dental Advisory Given   Pulmonary asthma , sleep apnea , former smoker,  breath sounds clear to auscultation        Cardiovascular negative cardio ROS  Rhythm:Regular Rate:Normal     Neuro/Psych  Headaches,    GI/Hepatic negative GI ROS, Neg liver ROS,   Endo/Other  negative endocrine ROS  Renal/GU negative Renal ROS     Musculoskeletal   Abdominal   Peds  Hematology   Anesthesia Other Findings   Reproductive/Obstetrics                        Anesthesia Physical Anesthesia Plan  ASA: III  Anesthesia Plan: General   Post-op Pain Management:    Induction: Intravenous  Airway Management Planned: Oral ETT  Additional Equipment:   Intra-op Plan:   Post-operative Plan: Extubation in OR  Informed Consent: I have reviewed the patients History and Physical, chart, labs and discussed the procedure including the risks, benefits and alternatives for the proposed anesthesia with the patient or authorized representative who has indicated his/her understanding and acceptance.   Dental advisory given  Plan Discussed with: Anesthesiologist, Surgeon and CRNA  Anesthesia Plan Comments:        Anesthesia Quick Evaluation

## 2013-05-25 NOTE — Preoperative (Signed)
Beta Blockers   Reason not to administer Beta Blockers:Not Applicable 

## 2013-05-25 NOTE — Anesthesia Postprocedure Evaluation (Signed)
  Anesthesia Post-op Note  Patient: Victor PhenixJohn E Sanchez  Procedure(s) Performed: Procedure(s): NASAL SEPTOPLASTY WITH TURBINATE REDUCTION (Bilateral) UVULOPLASTY, LASER ASSISTED (N/A)  Patient Location: PACU  Anesthesia Type:General  Level of Consciousness: awake  Airway and Oxygen Therapy: Patient Spontanous Breathing  Post-op Pain: mild  Post-op Assessment: Post-op Vital signs reviewed  Post-op Vital Signs: Reviewed  Complications: No apparent anesthesia complications

## 2013-05-26 NOTE — Discharge Summary (Signed)
  05/26/2013 11:05 AM  Hassan BucklerPope, Gayle [phone]  Post-Op Day 1  Discharge Note    Temp:  [97.5 F (36.4 C)-99.2 F (37.3 C)] 97.8 F (36.6 C) (03/12 0700) Pulse Rate:  [73-86] 73 (03/12 0349) Resp:  [12-25] 12 (03/12 0349) BP: (127-156)/(60-80) 141/72 mmHg (03/12 0349) SpO2:  [92 %-98 %] 98 % (03/12 0349) Weight:  [115.5 kg (254 lb 10.1 oz)] 115.5 kg (254 lb 10.1 oz) (03/11 1406),     Intake/Output Summary (Last 24 hours) at 05/26/13 1105 Last data filed at 05/26/13 0900  Gross per 24 hour  Intake 3258.34 ml  Output   3925 ml  Net -666.66 ml    No results found for this or any previous visit (from the past 24 hour(s)).  SUBJECTIVE:  Throat pain better.  Taking liquids well.  OBJECTIVE:  Sl old blood in pharynx.  Min swelling of palate.  Nasal packs removed with minimal bleeding  IMPRESSION:  Satisfactory check  PLAN:  Discharge home  Admit:  11 MAR Discharge:  12 MAR Final Diagnosis:  OSA, deviated nasal septum, hypertrophic inferior turbinates Proc:  Nasal septoplasty, SMR inferior turbinates, LAUP, 11 MAR Comp:  None Cond:  Ambulatory. Breathing well.  No bleeding. Pain controlled.  Taking good po hydration R/v: 8 days Rx's:  Keflex, Oxycodone Instructions written and given  Hosp Course:  Pt underwent surgery.  Was observed afterwards overnight in 3300 step down unit with no bleeing, good O2 sats, pain controlled.  Packs removed on POD 1 without difficulty.  Discharged to home and care of family.     Flo ShanksWOLICKI, Aubriauna Riner

## 2013-05-31 ENCOUNTER — Encounter (HOSPITAL_COMMUNITY): Payer: Self-pay | Admitting: Otolaryngology

## 2013-06-15 ENCOUNTER — Ambulatory Visit (INDEPENDENT_AMBULATORY_CARE_PROVIDER_SITE_OTHER): Payer: BC Managed Care – PPO | Admitting: Cardiovascular Disease

## 2013-06-15 VITALS — BP 128/98 | HR 72 | Ht 73.0 in | Wt 242.8 lb

## 2013-06-15 DIAGNOSIS — E8881 Metabolic syndrome: Secondary | ICD-10-CM

## 2013-06-15 DIAGNOSIS — R7301 Impaired fasting glucose: Secondary | ICD-10-CM

## 2013-06-15 DIAGNOSIS — I499 Cardiac arrhythmia, unspecified: Secondary | ICD-10-CM

## 2013-06-15 DIAGNOSIS — E669 Obesity, unspecified: Secondary | ICD-10-CM

## 2013-06-15 DIAGNOSIS — E785 Hyperlipidemia, unspecified: Secondary | ICD-10-CM

## 2013-06-15 DIAGNOSIS — J342 Deviated nasal septum: Secondary | ICD-10-CM

## 2013-06-15 DIAGNOSIS — Z79899 Other long term (current) drug therapy: Secondary | ICD-10-CM

## 2013-06-15 DIAGNOSIS — R7309 Other abnormal glucose: Secondary | ICD-10-CM

## 2013-06-15 DIAGNOSIS — R002 Palpitations: Secondary | ICD-10-CM

## 2013-06-15 DIAGNOSIS — G4733 Obstructive sleep apnea (adult) (pediatric): Secondary | ICD-10-CM

## 2013-06-15 DIAGNOSIS — E782 Mixed hyperlipidemia: Secondary | ICD-10-CM

## 2013-06-15 LAB — COMPREHENSIVE METABOLIC PANEL
ALK PHOS: 55 U/L (ref 39–117)
ALT: 52 U/L (ref 0–53)
AST: 33 U/L (ref 0–37)
Albumin: 4.4 g/dL (ref 3.5–5.2)
BUN: 12 mg/dL (ref 6–23)
CO2: 25 mEq/L (ref 19–32)
Calcium: 9.6 mg/dL (ref 8.4–10.5)
Chloride: 100 mEq/L (ref 96–112)
Creat: 0.85 mg/dL (ref 0.50–1.35)
Glucose, Bld: 165 mg/dL — ABNORMAL HIGH (ref 70–99)
Potassium: 4.6 mEq/L (ref 3.5–5.3)
Sodium: 138 mEq/L (ref 135–145)
Total Bilirubin: 0.5 mg/dL (ref 0.2–1.2)
Total Protein: 7.4 g/dL (ref 6.0–8.3)

## 2013-06-15 LAB — CBC
HEMATOCRIT: 43.9 % (ref 39.0–52.0)
HEMOGLOBIN: 14.8 g/dL (ref 13.0–17.0)
MCH: 29.3 pg (ref 26.0–34.0)
MCHC: 33.7 g/dL (ref 30.0–36.0)
MCV: 86.9 fL (ref 78.0–100.0)
Platelets: 344 10*3/uL (ref 150–400)
RBC: 5.05 MIL/uL (ref 4.22–5.81)
RDW: 14.3 % (ref 11.5–15.5)
WBC: 9.5 10*3/uL (ref 4.0–10.5)

## 2013-06-15 LAB — LIPID PANEL
CHOL/HDL RATIO: 4 ratio
Cholesterol: 203 mg/dL — ABNORMAL HIGH (ref 0–200)
HDL: 51 mg/dL (ref >39)
LDL CALC: 72 mg/dL (ref 0–99)
Triglycerides: 398 mg/dL — ABNORMAL HIGH (ref <150)
VLDL: 80 mg/dL — AB (ref 0–40)

## 2013-06-15 LAB — MAGNESIUM: MAGNESIUM: 2 mg/dL (ref 1.5–2.5)

## 2013-06-15 LAB — HEMOGLOBIN A1C
Hgb A1c MFr Bld: 8 % — ABNORMAL HIGH (ref <5.7)
Mean Plasma Glucose: 183 mg/dL — ABNORMAL HIGH (ref <117)

## 2013-06-15 NOTE — Patient Instructions (Addendum)
Your physician recommends that you return for lab work fasting.  Your physician has requested that you have an echocardiogram. Echocardiography is a painless test that uses sound waves to create images of your heart. It provides your doctor with information about the size and shape of your heart and how well your heart's chambers and valves are working. This procedure takes approximately one hour. There are no restrictions for this procedure. This will be scheduled this week or early next week.  Your physician has recommended that you wear an event monitor. Event monitors are medical devices that record the heart's electrical activity. Doctors most often us these monitors to diagnose arrhythmias. Arrhythmias are problems with the speed or rhythm of the heartbeat. The monitor is a small, portable device. You can wear one while you do your normal daily activities. This is usually used to diagnose what is causing palpitations/syncope (passing out).   Your physician recommends that you schedule a follow-up appointment in: 4-6 WEEKS.

## 2013-06-16 LAB — TSH: TSH: 3.39 u[IU]/mL (ref 0.350–4.500)

## 2013-06-17 ENCOUNTER — Encounter: Payer: Self-pay | Admitting: Cardiovascular Disease

## 2013-06-17 DIAGNOSIS — E669 Obesity, unspecified: Secondary | ICD-10-CM | POA: Insufficient documentation

## 2013-06-17 DIAGNOSIS — I499 Cardiac arrhythmia, unspecified: Secondary | ICD-10-CM | POA: Insufficient documentation

## 2013-06-17 DIAGNOSIS — E785 Hyperlipidemia, unspecified: Secondary | ICD-10-CM | POA: Insufficient documentation

## 2013-06-17 DIAGNOSIS — R7301 Impaired fasting glucose: Secondary | ICD-10-CM | POA: Insufficient documentation

## 2013-06-17 NOTE — Progress Notes (Signed)
Patient ID: Victor Sanchez, male   DOB: 06-10-1940, 73 y.o.   MRN: [id number]     PATIENT PROFILE: [name] is a 73 year old white male weight seen initially in 1999 and subsequently last in January 2008. He presents to the office today referred by Dr. Lazarus Salines for evaluation of possible abnormal heart rhythm.   HPI: Mr. developed somewhat atypical chest pain in 1999. A nuclear perfusion study raised the possibility of focal inferior scar with possible inferolateral redistribution. Definitive cardiac catheterization revealed normal LV function and normal coronary arteries. I saw him in 2008. A nuclear study at that time again showed diaphragmatic attenuation per you know the formal interpretation was interpreted as possible ischemia it is my feeling that this was not particularly in light of his previous abnormal study and subsequent cardiac catheterization. [place]ially[place] He ultimately returned back to the [place] area. He does walk 2-3 miles a least 5 days per week. In January he developed URI symptoms. He has been having significant difficulty with sleep. He also had significant deviated septum, and has had episodes with paroxysmal positional vertigo. As part of his preoperative evaluation for ENT surgery he was found to have possible abnormal heart rhythms. Apparently an ECG was done which did not reveal ectopy and he underwent his ENT surgery for with a nasal septoplasty, reduction of his inferior turbinates, and LAUP. At that time Dr. Preston Fleeting he had seen the patient he was concerned about the possibility of atrial fibrillation. He presents now for cardiology evaluation.  Victor Sanchez denies any chest pain. He is unaware of presyncope or syncope. He denies significant change in exercise tolerance. He was going to [place] for 2 weeks in 2 weeks.  Past Medical History  Diagnosis Date  . Enlarged prostate   . History of asthma     as a child  . Headache(784.0)     occ.  . Sleep apnea     2013    Past Surgical History  Procedure Laterality Date  . Cardiac catheterization      ~ 15 years ago  . Ventral hernia repair      x 2  . Tonsillectomy      as a child  . Appendectomy  1968  . Knee arthroscopy  1990'    with baker's cyst excision unsure side  . Nasopharyngoscopy  1972    cyst removal  . Nasal septoplasty w/ turbinoplasty Bilateral 05/25/2013    Procedure: NASAL SEPTOPLASTY WITH TURBINATE REDUCTION;  Surgeon: Flo Shanks, MD;  Location[facility] OR;  Service: ENT;  Laterality: Bilateral;  . Uvulectomy N/A 05/25/2013    Procedure: Archer Asa ASSISTED;  Surgeon: Flo Shanks, MD;  Location[facility];  Service: ENT;  Laterality: N/A;    No Known Allergies  Current Outpatient Prescriptions  Medication Sig Dispense Refill  . aspirin 325 MG tablet Take 325 mg by mouth daily.      . finasteride (PROSCAR) 5 MG tablet Take 5 mg by mouth daily.      . Multiple Vitamins-Minerals (MULTIVITAMIN WITH MINERALS) tablet Take 1 tablet by mouth daily.      . pravastatin (PRAVACHOL) 40 MG tablet Take 40 mg by mouth daily.      . tamsulosin (FLOMAX) 0.4 MG CAPS capsule Take 0.4 mg by mouth daily.      Marland Kitchen zolpidem (AMBIEN) 10 MG tablet Take 1 tablet by mouth as needed.       No current facility-administered  medications for this visit.    Socially he is married. He has 3 children 4 grandchildren. There is no tobacco use. He did smoke for short-term and quit in 1969. He does drink alcohol.  History reviewed. No pertinent family history.  ROS is negative for fever chills or night sweats. He denies rash. He denies change in vision. He is status post recent surgery on his nose and palate by Dr. Margit BandaWolinsky. He denies wheezing. He is unaware of any sensation of abnormal heart rhythm or palpitations even when he was noted to have an irregular rhythm by Dr. Marin ShutterLewicki. He denies nausea vomiting or diarrhea. He is unaware of blood in stool or urine. He denies  claudication. He denies edema. There are no paresthesias. He does have difficulty with sleep. He does wake up frequently. He was told of having mild sleep apnea in the past. Other comprehensive 14 point system review is negative.  PE BP 128/98  Pulse 72  Ht 6\' 1"  (1.854 m)  Wt 242 lb 12.8 oz (110.133 kg)  BMI 32.04 kg/m2 General: Alert, oriented, no distress.  Skin: normal turgor, no rashes HEENT: Normocephalic, atraumatic. Pupils round and reactive; sclera anicteric; extraocular muscles intact; Fundi  without hemorrhages or exudate  Nose without nasal septal hypertrophy; status post recent surgery for deviated septum and reduction of inferior turbinates Mouth/Parynx benign; he is status post recent laser surgery to the soft palate Neck: No JVD, no carotid bruits; normal carotid upstroke Lungs: clear to ausculatation and percussion; no wheezing or rales Chest wall: without tenderness to palpitation Heart: RRR, s1 s2 normal I do not hear any arrhythmias to prolonged auscultation. Faint 1/6 systolic murmur. No S3 gallop no rubs thrills or heaves. Abdomen: Mild central adiposity. soft, nontender; no hepatosplenomehaly, BS+; abdominal aorta nontender and not dilated by palpation. Back: no CVA tenderness Pulses 2+ Extremities: no clubbinbg cyanosis or edema, Homan's sign negative  Neurologic: grossly nonfocal; Cranial nerves grossly wnl Psychologic: Normal mood and affect    ECG (independently read by me): Normal sinus rhythm at 72 beats per minute. PR interval 144 ms; QTc interval 451 ms  LABS: I did review laboratory which was done preoperatively on 05/18/2013 . At that time his glucose was 202. Liver function studies were normal. Potassium 4.6.  BMET    Component Value Date/Time   NA 138 06/15/2013 1005   K 4.6 06/15/2013 1005   CL 100 06/15/2013 1005   CO2 25 06/15/2013 1005   GLUCOSE 165* 06/15/2013 1005   BUN 12 06/15/2013 1005   CREATININE 0.85 06/15/2013 1005   CREATININE 0.76 05/18/2013  0839   CALCIUM 9.6 06/15/2013 1005   GFRNONAA 89* 05/18/2013 0839   GFRAA >90 05/18/2013 0839     Hepatic Function Panel     Component Value Date/Time   PROT 7.4 06/15/2013 1005   ALBUMIN 4.4 06/15/2013 1005   AST 33 06/15/2013 1005   ALT 52 06/15/2013 1005   ALKPHOS 55 06/15/2013 1005   BILITOT 0.5 06/15/2013 1005     CBC    Component Value Date/Time   WBC 9.5 06/15/2013 1005   RBC 5.05 06/15/2013 1005   HGB 14.8 06/15/2013 1005   HCT 43.9 06/15/2013 1005   PLT 344 06/15/2013 1005   MCV 86.9 06/15/2013 1005   MCH 29.3 06/15/2013 1005   MCHC 33.7 06/15/2013 1005   RDW 14.3 06/15/2013 1005     BNP No results found for this basename: probnp    Lipid Panel  Component Value Date/Time   CHOL 203* 06/15/2013 1005   TRIG 398* 06/15/2013 1005   HDL 51 06/15/2013 1005   CHOLHDL 4.0 06/15/2013 1005   VLDL 80* 06/15/2013 1005   LDLCALC 72 06/15/2013 1005     RADIOLOGY: No results found.   ASSESSMENT AND PLAN:  [name] is a 73 year old gentleman who previously has undergone cardiac catheterization in 1999 which showed normal coronary arteries and normal LV function. An echo Doppler study done in January 2008 revealed mild concentric left ventricular hypertrophy. Mild tricuspid regurgitation and mild aortic valve sclerosis as well as mild mitral regurgitation. He recently was found to have palpable irregularity to his cardiac rhythm as part of his preoperative assessment by Dr. Elvina Sidle D. prior to undergoing ENT surgery. His ECG today is normal. The patient is completely asymptomatic with reference to his arrhythmia. Presently, I am recommending that he undergo a seven-year followup echo Doppler study to reassess systolic and diastolic function. I did recommend laboratory be checked. Apparently these results are as noted above. 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 He will be contacted regarding this.  He did have an elevated glucose on preoperative laboratory at 202 and laboratory now is 162 fasting. Will need to be evaluated for diabetes mellitus. He tells me he'll be going to [place] in 2 weeks. I will have him wear a ten-day cardiac monitor prior to his trip to make sure he's not having significant asymptomatic episodes of arrhythmia. I'll see him back in the office in followup from his trip and further recommendations will be made at that time.  Lennette Bihari, MD, [facility] 06/17/2013 7:55 AM

## 2013-06-21 ENCOUNTER — Telehealth: Payer: Self-pay | Admitting: *Deleted

## 2013-06-21 ENCOUNTER — Ambulatory Visit (HOSPITAL_COMMUNITY)
Admission: RE | Admit: 2013-06-21 | Discharge: 2013-06-21 | Disposition: A | Discharge status: Home / Self Care | Payer: BC Managed Care – PPO | Source: Ambulatory Visit | Attending: Internal Medicine | Admitting: Internal Medicine

## 2013-06-21 ENCOUNTER — Telehealth: Payer: Self-pay | Admitting: Cardiovascular Disease

## 2013-06-21 DIAGNOSIS — R002 Palpitations: Secondary | ICD-10-CM

## 2013-06-21 DIAGNOSIS — I059 Rheumatic mitral valve disease, unspecified: Secondary | ICD-10-CM

## 2013-06-21 MED ORDER — METOPROLOL SUCCINATE ER 50 MG PO TB24: ORAL_TABLET | ORAL | Status: DC

## 2013-06-21 NOTE — Telephone Encounter (Signed)
Fax not received.  Dr. Tresa EndoKelly in the office today.  Report printed and placed on cart for review and message forwarded to Dr. Tresa EndoKelly.

## 2013-06-21 NOTE — Telephone Encounter (Signed)
Please call,questions about his test results that you gave him today.

## 2013-06-21 NOTE — Telephone Encounter (Signed)
Informed patient of abnormal cardionet strips. Dr. Tresa EndoKelly would like for him to start metoprolol succ 50 mg as directed. Also informed patient some lab values also abnormal however Dr. Tresa EndoKelly states that he will discuss this and make recommendations @ 07/19/13 appointment. He doesn't want to make too many changes since he will be leaving the country on 06/29/13. Patient voiced understanding. Metoprolol succ sent via e-script to patient's pharmacy on file. Patient also instructed per V.O per Dr. Tresa EndoKelly to start 81 mg ASA.

## 2013-06-21 NOTE — Telephone Encounter (Signed)
Message forwarded to Wanda, CMA.  

## 2013-06-21 NOTE — Telephone Encounter (Signed)
Victor Sanchez was calling with results.  TK

## 2013-06-21 NOTE — Telephone Encounter (Signed)
Returned a call to patient.he had some additional questions concerning his elevated blood sugar. Questions answered to patient's satisfaction. He requests for Dr. Tresa EndoKelly to call him at his convenience. Informed patient that I  Will send a message for Dr. Tresa EndoKelly to call him.

## 2013-06-21 NOTE — Progress Notes (Signed)
2D Echo Performed 06/21/2013    Samay Delcarlo, RCS  

## 2013-06-21 NOTE — Telephone Encounter (Signed)
Onset Afib 139 bpm lasted 25 secs.  Will fax report.  Will place on DOD cart for review once received.

## 2013-06-22 ENCOUNTER — Telehealth: Payer: Self-pay | Admitting: *Deleted

## 2013-06-22 NOTE — Telephone Encounter (Signed)
Will add on patient to office schedule 06/22/13

## 2013-06-22 NOTE — Telephone Encounter (Signed)
Informed patient that I notified Dr. Tresa EndoKelly that he is requesting a phone call from him to discuss his tests results. Dr. Tresa EndoKelly suggested that the patient  come to the office tomorrow for appointment to discuss echo and lab results and evaluate him prior to him leaving the country next week. Patient agreed with plan and will come to the office tomorrow for a 8:15 AM appointment.

## 2013-06-23 ENCOUNTER — Ambulatory Visit (INDEPENDENT_AMBULATORY_CARE_PROVIDER_SITE_OTHER): Payer: BC Managed Care – PPO | Admitting: Cardiovascular Disease

## 2013-06-23 VITALS — BP 134/98 | HR 63 | Ht 73.0 in | Wt 241.7 lb

## 2013-06-23 DIAGNOSIS — G4733 Obstructive sleep apnea (adult) (pediatric): Secondary | ICD-10-CM

## 2013-06-23 DIAGNOSIS — I480 Paroxysmal atrial fibrillation: Secondary | ICD-10-CM

## 2013-06-23 DIAGNOSIS — E785 Hyperlipidemia, unspecified: Secondary | ICD-10-CM

## 2013-06-23 DIAGNOSIS — Z7189 Other specified counseling: Secondary | ICD-10-CM

## 2013-06-23 DIAGNOSIS — E782 Mixed hyperlipidemia: Secondary | ICD-10-CM

## 2013-06-23 DIAGNOSIS — E119 Type 2 diabetes mellitus without complications: Secondary | ICD-10-CM

## 2013-06-23 DIAGNOSIS — I499 Cardiac arrhythmia, unspecified: Secondary | ICD-10-CM

## 2013-06-23 DIAGNOSIS — E669 Obesity, unspecified: Secondary | ICD-10-CM

## 2013-06-23 DIAGNOSIS — I4891 Unspecified atrial fibrillation: Secondary | ICD-10-CM

## 2013-06-23 MED ORDER — METFORMIN HCL 500 MG PO TABS: 500.0000 mg | ORAL_TABLET | Freq: Every day | ORAL | Status: DC

## 2013-06-23 MED ORDER — APIXABAN 5 MG PO TABS: 5.0000 mg | ORAL_TABLET | Freq: Two times a day (BID) | ORAL | Status: DC

## 2013-06-23 MED ORDER — FENOFIBRATE 145 MG PO TABS: 145.0000 mg | ORAL_TABLET | Freq: Every day | ORAL | Status: DC

## 2013-06-23 NOTE — Patient Instructions (Addendum)
Your physician has recommended you make the following change in your medication: start the metformin prescription. This is for your diabetes. Increase the metoprolol succ 50 mg to 1 tablet daily. Start new prescription for fenofibrate 145mg  and Eliquis 5 mg. This has been sent to the pharmacy.  STOP THE ASPRIN.Marland Kitchen.     Your physician recommends that you return for lab work in: 4 weeks.  You will be scheduled to be seen by a nutritionist.  You will need to continue wearing the cardionet monitor.  Your physician recommends that you schedule a follow-up appointment in: 6 weeks with Dr. Tresa EndoKelly.

## 2013-06-24 ENCOUNTER — Telehealth: Payer: Self-pay | Admitting: Cardiovascular Disease

## 2013-06-24 NOTE — Telephone Encounter (Signed)
Returned call.  Left message to call [employer], number on back on book in kit and the kit.  Also to call back before 4:45pm if questions.

## 2013-06-24 NOTE — Telephone Encounter (Signed)
Victor Sanchez is asking for the seven day supply kit for his monitor .because Dr.Kelly extended his time with the monitor  He needs two of them .Marland Kitchen Please Call   Thanks

## 2013-06-30 ENCOUNTER — Telehealth: Payer: Self-pay | Admitting: Cardiovascular Disease

## 2013-06-30 NOTE — Telephone Encounter (Signed)
Spoke with patient's wife informing her of Dr. Landry DykeKelly's decision in reference to his continued use of the monitor. She states the patient is outside nd she will inform him of Dr. Landry DykeKelly's decision.

## 2013-06-30 NOTE — Telephone Encounter (Signed)
Dr. Kelly please advise.

## 2013-06-30 NOTE — Telephone Encounter (Signed)
See if he can wear for a total of 21 days, if possible. If not able, then dc

## 2013-06-30 NOTE — Telephone Encounter (Signed)
Please call have a couple questions about the heart monitor that he is currently wearing .Marland Kitchen. Thanks

## 2013-06-30 NOTE — Telephone Encounter (Signed)
Patient called to inform us that the patches from the monitor has really irritated his skin something awful. He wants to know if he has to continue wearing it. He is on day 17 and wants to know if Dr. Tresa EndoKelly feels that he has enough information so that he can D/C the monitor. If not, then can he not wear it for a few days to give his skin time to heal. I informed the patient that I will advise Dr. Tresa EndoKelly of the situation and let him know his decision. Also informed the patient that I don't think [employer] will allow him to have a break in the monitoring. Patient voiced understanding and will await a call back.

## 2013-07-06 ENCOUNTER — Encounter: Payer: Self-pay | Admitting: Cardiovascular Disease

## 2013-07-06 DIAGNOSIS — E119 Type 2 diabetes mellitus without complications: Secondary | ICD-10-CM | POA: Insufficient documentation

## 2013-07-06 DIAGNOSIS — Z7189 Other specified counseling: Secondary | ICD-10-CM | POA: Insufficient documentation

## 2013-07-06 DIAGNOSIS — I480 Paroxysmal atrial fibrillation: Secondary | ICD-10-CM | POA: Insufficient documentation

## 2013-07-06 NOTE — Progress Notes (Signed)
Patient ID: Victor Sanchez, male   DOB: 09/28/1940, 73 y.o.   MRN: [id number]     HPI: [name] is a [age] year old white male who I had nitially seen in 1999 and subsequently  in January 2008.  He reestablished care with me one week ago, when he was referred by Dr. Lazarus SalinesWolicki for evaluation of possible abnormal heart rhythm.   [name] developed  atypical chest pain in 1999. A nuclear perfusion study raised the possibility of focal inferior scar with possible inferolateral redistribution. Definitive cardiac catheterization revealed normal LV function and normal coronary arteries. In 2008 a nuclear study at that time again showed diaphragmatic attenuation although theformal interpretation was interpreted as possible ischemia.  [place] recently moved back to the [place] area and has been fairly active typically walking 2-3 miles a least 5 days per week. In January he developed URI symptoms. He has been having significant difficulty with sleep. He also had significant deviated septum, and has had episodes with paroxysmal positional vertigo. As part of his preoperative evaluation for ENT surgery he was found to have possible abnormal heart rhythms. Apparently an ECG was done which did not reveal ectopy and he underwent his ENT surgery for with a nasal septoplasty, reduction of his inferior turbinates, and LAUP. When Dr. Lazarus SalinesWolicki saw him  he was concerned about the possibility of atrial fibrillation.  He reestablished care with me one week ago.   [name] denies any chest pain. He is unaware of presyncope or syncope. He denies significant change in exercise tolerance. He was going to [place] for 2 weeks in 2 weeks.  Prior to his planned trip, I recommended that he undergo laboratory as well as wear a CardioNet monitor.  Blood work was abnormal in that his total cholesterol was 203, triglycerides 398, HDL 51, VLDL 80 LDL 72.  Magnesium was normal.  His fasting glucose was  165 and the hemoglobin A1c was elevated at 8.0 indicative of a mean plasma glucose of approximately 183.  He had normal hemoglobin and hematocrit.  A 2-D echo Doppler study revealed an ejection fraction in the 55-60%.  On his monitor, he developed recurrent episodes of paroxysmal atrial fibrillation with rates in the low 100s, but in one instance  he developed a 10 beat run of PSVT approximately 178 beats per minute.  He was contacted regarding these results and was brought into the office today and is seen as an add-on prior to his potential trip to [place] next week.  Past Medical History  Diagnosis Date  . Enlarged prostate   . History of asthma     as a child  . Headache(784.0)     occ.  . Sleep apnea     2013    Past Surgical History  Procedure Laterality Date  . Cardiac catheterization      ~ 15 years ago  . Ventral hernia repair      x 2  . Tonsillectomy      as a child  . Appendectomy  1968  . Knee arthroscopy  1990'    with baker's cyst excision unsure side  . Nasopharyngoscopy  1972    cyst removal  . Nasal septoplasty w/ turbinoplasty Bilateral 05/25/2013    Procedure: NASAL SEPTOPLASTY WITH TURBINATE REDUCTION;  Surgeon: Flo ShanksKarol Wolicki, MD;  [facility];  Service: ENT;  Laterality: Bilateral;  . Uvulectomy N/A 05/25/2013    Procedure: UVULOPLASTY, LASER ASSISTED;  Surgeon:  Flo Shanks, MD;  Location: [facility] OR;  Service: ENT;  Laterality: N/A;    No Known Allergies  Current Outpatient Prescriptions  Medication Sig Dispense Refill  . finasteride (PROSCAR) 5 MG tablet Take 5 mg by mouth daily.      . metoprolol succinate (TOPROL-XL) 50 MG 24 hr tablet 50 mg daily.      . Multiple Vitamins-Minerals (MULTIVITAMIN WITH MINERALS) tablet Take 1 tablet by mouth daily.      . pravastatin (PRAVACHOL) 40 MG tablet Take 40 mg by mouth daily.      . tamsulosin (FLOMAX) 0.4 MG CAPS capsule Take 0.4 mg by mouth daily.      Marland Kitchen zolpidem (AMBIEN) 10 MG tablet Take 1 tablet by mouth as  needed.      Marland Kitchen apixaban (ELIQUIS) 5 MG TABS tablet Take 1 tablet (5 mg total) by mouth 2 (two) times daily.  60 tablet  6  . fenofibrate (TRICOR) 145 MG tablet Take 1 tablet (145 mg total) by mouth daily.  30 tablet  6  . metFORMIN (GLUCOPHAGE) 500 MG tablet Take 1 tablet (500 mg total) by mouth daily with breakfast.  30 tablet  2   No current facility-administered medications for this visit.    Socially he is married. He has 3 children 4 grandchildren. There is no tobacco use. He did smoke for short-term and quit in 1969. He does drink alcohol.  History reviewed. No pertinent family history.  ROS is negative for fever chills or night sweats. He denies rash. He denies change in vision. He is status post recent surgery on his nose and palate by Dr. Margit Banda. He denies wheezing. He is unaware of any sensation of abnormal heart rhythm or palpitations even when he was noted to have an irregular rhythm by Dr. Lazarus Salines. He denies nausea vomiting or diarrhea. He is unaware of blood in stool or urine. He denies claudication. He denies edema. There are no paresthesias. He does have difficulty with sleep. He does wake up frequently. He was told of having mild sleep apnea in the past. Other comprehensive 14 point system review is negative.  PE BP 134/98  Pulse 63  Ht 6\' 1"  (1.854 m)  Wt 241 lb 11.2 oz (109.634 kg)  BMI 31.90 kg/m2 General: Alert, oriented, no distress.  Skin: normal turgor, no rashes HEENT: Normocephalic, atraumatic. Pupils round and reactive; sclera anicteric; extraocular muscles intact; Fundi  without hemorrhages or exudate  Nose without nasal septal hypertrophy; status post recent surgery for deviated septum and reduction of inferior turbinates Mouth/Parynx benign; he is status post recent laser surgery to the soft palate Neck: No JVD, no carotid bruits; normal carotid upstroke Lungs: clear to ausculatation and percussion; no wheezing or rales Chest wall: without tenderness to  palpitation Heart: RRR, s1 s2 normal I do not hear any arrhythmias to prolonged auscultation. Faint 1/6 systolic murmur. No S3 gallop; no rubs thrills or heaves. Abdomen: Mild central adiposity. soft, nontender; no hepatosplenomehaly, BS+; abdominal aorta nontender and not dilated by palpation. Back: no CVA tenderness Pulses 2+ Extremities: no clubbinbg cyanosis or edema, Homan's sign negative  Neurologic: grossly nonfocal; Cranial nerves grossly wnl Psychologic: Normal mood and affect  ECG today (independently read by me) normal sinus rhythm at 63 beats per minute.  PR interval 150 ms, QTc interval 433 ms  Prior 06/15/2013 ECG (independently read by me): Normal sinus rhythm at 72 beats per minute. PR interval 144 ms; QTc interval 451 ms  LABS:  Preoperatively on  05/18/2013 . At that time his glucose was 202. Liver function studies were normal. Potassium 4.6.  BMET    Component Value Date/Time   NA 138 06/15/2013 1005   K 4.6 06/15/2013 1005   CL 100 06/15/2013 1005   CO2 25 06/15/2013 1005   GLUCOSE 165* 06/15/2013 1005   BUN 12 06/15/2013 1005   CREATININE 0.85 06/15/2013 1005   CREATININE 0.76 05/18/2013 0839   CALCIUM 9.6 06/15/2013 1005   GFRNONAA 89* 05/18/2013 0839   GFRAA >90 05/18/2013 0839     Hepatic Function Panel     Component Value Date/Time   PROT 7.4 06/15/2013 1005   ALBUMIN 4.4 06/15/2013 1005   AST 33 06/15/2013 1005   ALT 52 06/15/2013 1005   ALKPHOS 55 06/15/2013 1005   BILITOT 0.5 06/15/2013 1005     CBC    Component Value Date/Time   WBC 9.5 06/15/2013 1005   RBC 5.05 06/15/2013 1005   HGB 14.8 06/15/2013 1005   HCT 43.9 06/15/2013 1005   PLT 344 06/15/2013 1005   MCV 86.9 06/15/2013 1005   MCH 29.3 06/15/2013 1005   MCHC 33.7 06/15/2013 1005   RDW 14.3 06/15/2013 1005     BNP No results found for this basename: probnp    Lipid Panel     Component Value Date/Time   CHOL 203* 06/15/2013 1005   TRIG 398* 06/15/2013 1005   HDL 51 06/15/2013 1005   CHOLHDL 4.0 06/15/2013 1005   VLDL 80*  06/15/2013 1005   LDLCALC 72 06/15/2013 1005     RADIOLOGY: No results found.   ASSESSMENT AND PLAN:  [name] is a 73 year old gentleman who previously has undergone cardiac catheterization in 1999 which showed normal coronary arteries and normal LV function. An echo Doppler study done in January 2008 revealed mild concentric left ventricular hypertrophy. Mild tricuspid regurgitation and mild aortic valve sclerosis as well as mild mitral regurgitation. He recently was found to have irregularity to his cardiac rhythm as part of his preoperative assessment by Dr. Lazarus SalinesWolicki prior to undergoing ENT surgery.  I spent over 45 minutes in the office with both he and his wife today and discuss with them .  The recent findings of both blood work, echo and cardionet monitoring.  Based on his laboratory, [name] is diabetic.  His A1c is 8.0.  He states he was never told of being diabetic before. I am  concerned that he also has a significant atherogenic dyslipidemia lipid panel with markedly elevated triglycerides, as well as VLDL level.  His CardioNet monitor has shown bursts of paroxysmal atrial fibrillation and possible PSVT at a rate of 178 beats per minute.  After a lengthy discussion concerning these abnormalities I have recommended that he see a nutritionist for an diabetic teaching and diet.  He had started on low-dose Toprol at 25 mg and I will now further titrate this to 50 mg.  I recommended he extend his CardioNet monitoring beyond the initial 10 days.  I also recommended the addition of fenofibrate 145 mg in light of his significant triglyceride elevation.  I recommended he discontinue his aspirin, but instead start Eliquis 5 mg twice a day for anticoagulation.  I also recommended the initiation of metformin 500 mg to start daily and then ultimately he will need to follow up with his primary physician for further dose increase and titration of additional medications if necessary.  After much discussion,  he and his wife did not feel comfortable  going to [place] next week for 2 weeks .  As result, I have written a note so that hopefully they can get a refund from their travel expense.  In 4 weeks I am checking a CMP and NMR lipid profile.  I will see him back in the office in followup and further recommendations will be made at that time.  Time spent: 45 minutes  Lennette Bihari, MD, [facility] 07/06/2013 5:41 PM

## 2013-07-11 ENCOUNTER — Telehealth: Payer: Self-pay | Admitting: Cardiovascular Disease

## 2013-07-14 LAB — COMPREHENSIVE METABOLIC PANEL
ALBUMIN: 4.3 g/dL (ref 3.5–5.2)
ALT: 44 U/L (ref 0–53)
AST: 27 U/L (ref 0–37)
Alkaline Phosphatase: 41 U/L (ref 39–117)
BUN: 17 mg/dL (ref 6–23)
CO2: 28 meq/L (ref 19–32)
Calcium: 9.5 mg/dL (ref 8.4–10.5)
Chloride: 104 mEq/L (ref 96–112)
Creat: 0.93 mg/dL (ref 0.50–1.35)
GLUCOSE: 125 mg/dL — AB (ref 70–99)
POTASSIUM: 4.6 meq/L (ref 3.5–5.3)
Sodium: 140 mEq/L (ref 135–145)
Total Bilirubin: 0.6 mg/dL (ref 0.2–1.2)
Total Protein: 6.7 g/dL (ref 6.0–8.3)

## 2013-07-19 ENCOUNTER — Ambulatory Visit (INDEPENDENT_AMBULATORY_CARE_PROVIDER_SITE_OTHER): Payer: BC Managed Care – PPO | Admitting: Cardiovascular Disease

## 2013-07-19 ENCOUNTER — Encounter: Payer: Self-pay | Admitting: Cardiovascular Disease

## 2013-07-19 VITALS — BP 148/88 | HR 64 | Ht 73.0 in | Wt 235.1 lb

## 2013-07-19 DIAGNOSIS — G4733 Obstructive sleep apnea (adult) (pediatric): Secondary | ICD-10-CM

## 2013-07-19 DIAGNOSIS — R5383 Other fatigue: Secondary | ICD-10-CM

## 2013-07-19 DIAGNOSIS — E669 Obesity, unspecified: Secondary | ICD-10-CM

## 2013-07-19 DIAGNOSIS — I480 Paroxysmal atrial fibrillation: Secondary | ICD-10-CM

## 2013-07-19 DIAGNOSIS — I4891 Unspecified atrial fibrillation: Secondary | ICD-10-CM

## 2013-07-19 DIAGNOSIS — R5381 Other malaise: Secondary | ICD-10-CM

## 2013-07-19 DIAGNOSIS — R7301 Impaired fasting glucose: Secondary | ICD-10-CM

## 2013-07-19 DIAGNOSIS — R7309 Other abnormal glucose: Secondary | ICD-10-CM

## 2013-07-19 DIAGNOSIS — I499 Cardiac arrhythmia, unspecified: Secondary | ICD-10-CM

## 2013-07-19 DIAGNOSIS — E119 Type 2 diabetes mellitus without complications: Secondary | ICD-10-CM

## 2013-07-19 DIAGNOSIS — E785 Hyperlipidemia, unspecified: Secondary | ICD-10-CM

## 2013-07-19 NOTE — Patient Instructions (Addendum)
Your physician recommends that you schedule a follow-up appointment and fasting blood work in 3 months.

## 2013-07-20 ENCOUNTER — Telehealth: Payer: Self-pay | Admitting: *Deleted

## 2013-07-20 NOTE — Telephone Encounter (Signed)
Mailed travel cancellation form to patient.

## 2013-07-22 NOTE — Telephone Encounter (Signed)
Closed encounter °

## 2013-07-29 ENCOUNTER — Encounter: Payer: Self-pay | Admitting: Cardiovascular Disease

## 2013-07-30 ENCOUNTER — Encounter: Payer: Self-pay | Admitting: Cardiovascular Disease

## 2013-07-30 NOTE — Progress Notes (Signed)
Patient ID: [name], male   DOB: 07/19/1940, 73 y.o.   MRN: [id number]     HPI: [name] is a 73 year old white male who I had nitially seen in 1999 and subsequently  in January 2008.  He reestablished care with me 1 month ago when he was referred by Dr. Lazarus Sanchez for evaluation of possible abnormal heart rhythm.  He presents now for followup cardiology evaluation.   [name] developed  atypical chest pain in 1999. A nuclear perfusion study raised the possibility of focal inferior scar with possible inferolateral redistribution. Cardiac catheterization revealed normal LV function and normal coronary arteries. In 2008 a nuclear study showed diaphragmatic attenuation although the ormal interpretation was interpreted as possible ischemia.  [place] he moved back to the [place] area and has been fairly active typically walking 2-3 miles a least 5 days per week. In January he developed URI symptoms. He had been having significant difficulty with sleep. He also had significant deviated septum, and has had episodes with paroxysmal positional vertigo. As part of his preoperative evaluation for ENT surgery he was found to have possible abnormal heart rhythms. An ECG was done which did not reveal ectopy and he underwent his ENT surgery for with a nasal septoplasty, reduction of his inferior turbinates, and LAUP. When Dr. Lazarus Sanchez saw him  he was concerned about the possibility of atrial fibrillation.  He reestablished care with me last month.   At that time prior to a planned trip to [place] for 2 weeks I recommended that he undergo laboratory as well as wear a CardioNet monitor.  Blood work was abnormal in that his total cholesterol was 203, triglycerides 398, HDL 51, VLDL 80 LDL 72.  Magnesium was normal.  His fasting glucose was 165 and the hemoglobin A1c was elevated at 8.0 indicative of a mean plasma glucose of approximately 183.  He had normal hemoglobin and  hematocrit.  A 2-D echo Doppler study revealed an ejection fraction in the 55-60%.  On his monitor, he developed recurrent episodes of paroxysmal atrial fibrillation with rates in the low 100s, but in one instance  he developed a 10 beat run of PSVT approximately 178 beats per minute.  He was contacted regarding these results and was brought into the office on 06/23/2013.  At that time, reviewed his laboratory, as well as his cardiac monitor in detail.  I reviewed his echo Doppler in detail, which revealed mild aortic valve sclerosis, mild tricuspid regurgitation, and mild MR, with normal systolic function.  His laboratory was suggestive of type 2 diabetes mellitus.  With his episodes of PAF noted on his monitor.  I recommended institution of low-dose Toprol initially at 25 mg and titrate this to 50 mg.  I also extended his cardiac monitor beyond the initial 10 days.  I recommended the addition of fenofibrate 145 mg in light of his significant triglyceride elevation and also suggested initiation of metformin 500 mg daily with plans for primary care followup.  He canceled his planned trip to [place].  Over the past month, he did see a nutritionist.  He did develop stomach cramps secondary to metformin and stop taking this.  He has been much more cognizant in his dietary habits.  He's lost 6 pounds.  The subsequent CardioNet monitor did not demonstrate any further episodes of PAF on his increased beta blocker therapy although he did have some sinus tachycardia during activity.  Repeat blood work was significantly  improved with his fasting glucose improved from 165, and remotely, 202.  Liver function studies were normal.  Renal function was normal.  He presents for followup evaluation.  Past Medical History  Diagnosis Date  . Enlarged prostate   . History of asthma     as a child  . Headache(784.0)     occ.  . Sleep apnea     2013    Past Surgical History  Procedure Laterality Date  . Cardiac  catheterization      ~ 15 years ago  . Ventral hernia repair      x 2  . Tonsillectomy      as a child  . Appendectomy  1968  . Knee arthroscopy  1990'    with baker's cyst excision unsure side  . Nasopharyngoscopy  1972    cyst removal  . Nasal septoplasty w/ turbinoplasty Bilateral 05/25/2013    Procedure: NASAL SEPTOPLASTY WITH TURBINATE REDUCTION;  Surgeon: Flo Shanks, MD;  Location: [facility] OR;  Service: ENT;  Laterality: Bilateral;  . Uvulectomy N/A 05/25/2013    Procedure: Archer Asa ASSISTED;  Surgeon: Flo Shanks, MD;  Location: [facility] OR;  Service: ENT;  Laterality: N/A;    No Known Allergies  Current Outpatient Prescriptions  Medication Sig Dispense Refill  . apixaban (ELIQUIS) 5 MG TABS tablet Take 1 tablet (5 mg total) by mouth 2 (two) times daily.  60 tablet  6  . fenofibrate (TRICOR) 145 MG tablet Take 1 tablet (145 mg total) by mouth daily.  30 tablet  6  . finasteride (PROSCAR) 5 MG tablet Take 5 mg by mouth daily.      . metoprolol succinate (TOPROL-XL) 50 MG 24 hr tablet 50 mg daily.      . Multiple Vitamins-Minerals (MULTIVITAMIN WITH MINERALS) tablet Take 1 tablet by mouth daily.      . pravastatin (PRAVACHOL) 40 MG tablet Take 40 mg by mouth daily.      . tamsulosin (FLOMAX) 0.4 MG CAPS capsule Take 0.4 mg by mouth daily.      Marland Kitchen zolpidem (AMBIEN) 10 MG tablet Take 1 tablet by mouth as needed.       No current facility-administered medications for this visit.    Socially he is married. He has 3 children 4 grandchildren. There is no tobacco use. He did smoke for short-term and quit in 1969. He does drink alcohol.  History reviewed. No pertinent family history.   ROS General: Negative; No fevers, chills, or night sweats; positive for purposeful 6 pound weight loss HEENT: Negative; No changes in vision or hearing, sinus congestion, difficulty swallowing Pulmonary: Negative; No cough, wheezing, shortness of breath, hemoptysis Cardiovascular: Negative; No  chest pain, presyncope, syncope, palpatations; no edema, appear GI: Negative; No nausea, vomiting, diarrhea, or abdominal pain GU: Negative; No dysuria, hematuria, or difficulty voiding Musculoskeletal: Negative; no myalgias, joint pain, or weakness Hematologic/Oncology: Negative; no easy bruising, bleeding Endocrine: Negative; no heat/cold intolerance; he is unaware of polydipsia or polyuria Neuro: Negative; no changes in balance, headaches Skin: Negative; No rashes or skin lesions Psychiatric: Negative; No behavioral problems, depression Sleep: Positive for sleep apnea, currently not on CPAP, but status post recent ENT surgery; No snoring, daytime sleepiness, hypersomnolence, bruxism, restless legs, hypnogognic hallucinations, no cataplexy Other comprehensive 14 point system review is negative.   PE BP 148/88  Pulse 64  Ht 6\' 1"  (1.854 m)  Wt 235 lb 1.6 oz (106.641 kg)  BMI 31.02 kg/m2 General: Alert, oriented, no distress.  Skin:  normal turgor, no rashes HEENT: Normocephalic, atraumatic. Pupils round and reactive; sclera anicteric; extraocular muscles intact; Fundi  without hemorrhages or exudate  Nose without nasal septal hypertrophy; status post recent surgery for deviated septum and reduction of inferior turbinates Mouth/Parynx benign; he is status post recent laser surgery to the soft palate Neck: No JVD, no carotid bruits; normal carotid upstroke Lungs: clear to ausculatation and percussion; no wheezing or rales Chest wall: without tenderness to palpitation Heart: PMI is not displaced RRR, s1 s2 normal I do not hear any arrhythmias to prolonged auscultation. Faint 1/6 systolic murmur. No S3 gallop; no rubs thrills or heaves. Abdomen: Mild central adiposity. soft, nontender; no hepatosplenomehaly, BS+; abdominal aorta nontender and not dilated by palpation. Back: no CVA tenderness Pulses 2+ Extremities: no clubbing cyanosis or edema, Homan's sign negative  Neurologic: grossly  nonfocal; Cranial nerves grossly wnl Psychologic: Normal mood and affect  ECG (independently read by me) normal sinus rhythm at 64 beats per minute.  Normal intervals.  No ST segment changes.  ECG on 06/23/13 (independently read by me) normal sinus rhythm at 63 beats per minute.  PR interval 150 ms, QTc interval 433 ms  Prior 06/15/2013 ECG (independently read by me): Normal sinus rhythm at 72 beats per minute. PR interval 144 ms; QTc interval 451 ms  LABS:  Preoperatively on 05/18/2013 . At that time his glucose was 202. Liver function studies were normal. Potassium 4.6.  BMET    Component Value Date/Time   NA 140 07/14/2013 0905   K 4.6 07/14/2013 0905   CL 104 07/14/2013 0905   CO2 28 07/14/2013 0905   GLUCOSE 125* 07/14/2013 0905   BUN 17 07/14/2013 0905   CREATININE 0.93 07/14/2013 0905   CREATININE 0.76 05/18/2013 0839   CALCIUM 9.5 07/14/2013 0905   GFRNONAA 89* 05/18/2013 0839   GFRAA >90 05/18/2013 0839     Hepatic Function Panel     Component Value Date/Time   PROT 6.7 07/14/2013 0905   ALBUMIN 4.3 07/14/2013 0905   AST 27 07/14/2013 0905   ALT 44 07/14/2013 0905   ALKPHOS 41 07/14/2013 0905   BILITOT 0.6 07/14/2013 0905     CBC    Component Value Date/Time   WBC 9.5 06/15/2013 1005   RBC 5.05 06/15/2013 1005   HGB 14.8 06/15/2013 1005   HCT 43.9 06/15/2013 1005   PLT 344 06/15/2013 1005   MCV 86.9 06/15/2013 1005   MCH 29.3 06/15/2013 1005   MCHC 33.7 06/15/2013 1005   RDW 14.3 06/15/2013 1005     BNP No results found for this basename: probnp    Lipid Panel     Component Value Date/Time   CHOL 203* 06/15/2013 1005   TRIG 398* 06/15/2013 1005   HDL 51 06/15/2013 1005   CHOLHDL 4.0 06/15/2013 1005   VLDL 80* 06/15/2013 1005   LDLCALC 72 06/15/2013 1005     RADIOLOGY: No results found.   ASSESSMENT AND PLAN:  [name] is a 73 year old gentleman who had documented normal coronary arteries and normal LV function at cardiac catheterization in 1999 after a nuclear perfusion study  raised the possibility of subtle ischemia.. An echo Doppler study in January 2008 revealed mild concentric left ventricular hypertrophy. Mild tricuspid regurgitation and mild aortic valve sclerosis as well as mild mitral regurgitation.  When I saw him reestablishment of care.  Subsequent cardiac monitor had demonstrated first of paroxysmal atrial fibrillation with possible PSVT at a rate of 178 beats per minute.  He was asymptomatic.  Since that time, he was started on eloquence 5 mg twice a day for anticoagulation.  He denies any bleeding side effects.  He has now been titrated up to Toprol 50 mg daily.  He is unaware of any palpitations.  Subsequent cardiac monitor after the dose increase did not reveal any recurrent episodes of PAF, and was without significant ectopy.  He now is on combination pravastatin 40 mg in addition to fenofibrate 145 mg for mixed hyperlipidemia.  I commended him on his weight loss.  His laboratory had suggested development of overt diabetes mellitus, type II.  He did not tolerate an initial trial of low-dose metformin.  He is trying to loose significant additional weight.  He is increasing his activity level.  In 3 months, we'll recheck hemoglobin A1c, CMP, NMR LipoProfile, CBC, as well as thyroid function studies.  We have set a weight goal of 210 pounds.  Currently, he is 2 and 35 pounds.  His blood pressure today is stable on current therapy.  I will see him in 3 months for followup evaluation.   Time spent: 30 minutes  Lennette Biharihomas A. Davin Archuletta, MD, [facility] 07/30/2013 11:29 AM

## 2013-08-11 ENCOUNTER — Encounter: Payer: BC Managed Care – PPO | Attending: Cardiovascular Disease | Admitting: *Deleted

## 2013-08-11 ENCOUNTER — Encounter: Payer: Self-pay | Admitting: *Deleted

## 2013-08-11 VITALS — Ht 73.0 in | Wt 226.9 lb

## 2013-08-11 DIAGNOSIS — E119 Type 2 diabetes mellitus without complications: Secondary | ICD-10-CM

## 2013-08-11 DIAGNOSIS — Z713 Dietary counseling and surveillance: Secondary | ICD-10-CM | POA: Insufficient documentation

## 2013-08-11 NOTE — Patient Instructions (Signed)
Plan:  Aim for 3 Carb Choices per meal (45 grams) +/- 1 either way  Aim for 0-15 Carbs per snack if hungry  Include protein in moderation with your meals and snacks Consider reading food labels for Total Carbohydrate and Fat Grams of foods Consider  increasing your activity level by walking for 30 minutes daily as tolerated Continue taking medication  as directed by MD  [place] Fus protein Bar Hard boiled eggs

## 2013-08-11 NOTE — Progress Notes (Signed)
Appt start time: 0930 end time:  1100.  Assessment:  Patient was seen on  08/11/13 for individual diabetes education. Victor Sanchez presents alone for DSME. He has a recent diagnosis of T2DM. He has adopted an almost zero carbohydrate way of eating along with his wife. We have discussed the need for carbohydrates in his meal plan. He has tried radical meal modifications in the past with great temporary success. However it did not withstand time with maintained success.  Current HbA1c: 8.0%  Preferred Learning Style:   No preference indicated   Learning Readiness:   Change in progress  MEDICATIONS: See list: none for  DIETARY INTAKE: Avoids bread, eats approx 9 eggs per week  B ( AM): Special K High Protein/skim milk, berries .Marland KitchenMarland Kitchen 1 slide Melba thin rye/soft boiled egg Snk ( AM): none  L ( PM): beef & salad, oil & vinegar dressing, tuna salad,  Snk ( PM): none D ( PM): salmon, brocolli (no starch) Snk ( PM): none Beverages: water, coffee, ice tea/ splenda or  [name])  Usual physical activity: walk 270 minutes per week  Intervention:  Nutrition counseling provided.  Discussed diabetes disease process and treatment options.  Discussed physiology of diabetes and role of obesity on insulin resistance.  Encouraged moderate weight reduction to improve glucose levels.  Discussed role of medications and diet in glucose control  Provided education on macronutrients on glucose levels.  Provided education on carb counting, importance of regularly scheduled meals/snacks, and meal planning  Discussed effects of physical activity on glucose levels and long-term glucose control.  Recommended 150 minutes of physical activity/week.  Reviewed patient medications.  Discussed role of medication on blood glucose and possible side effects  Discussed blood glucose monitoring and interpretation.  Discussed recommended target ranges and individual ranges.    Described short-term complications: hyper-  and hypo-glycemia.  Discussed causes,symptoms, and treatment options.  Discussed prevention, detection, and treatment of long-term complications.  Discussed the role of prolonged elevated glucose levels on body systems.  Discussed role of stress on blood glucose levels and discussed strategies to manage psychosocial issues.  Discussed recommendations for long-term diabetes self-care.  Established checklist for medical, dental, and emotional self-care.  Plan:  Aim for 3 Carb Choices per meal (45 grams) +/- 1 either way  Aim for 0-15 Carbs per snack if hungry  Include protein in moderation with your meals and snacks Consider reading food labels for Total Carbohydrate and Fat Grams of foods Consider  increasing your activity level by walking for 30 minutes daily as tolerated Continue taking medication  as directed by MD  Victor Sanchez protein Bar Hard boiled eggs  Teaching Method Utilized:  Visual Auditory Hands on  Handouts given during visit include: Living Well with Diabetes Carb Counting and Food Label handouts Meal Plan Card Snack sheet My plate  Barriers to learning/adherence to lifestyle change: none noted  Diabetes self-care support plan:   [employer] support group  Demonstrated degree of understanding via:  Teach Back   Monitoring/Evaluation:  Dietary intake, exercise, and body weight return f/u  prn.

## 2013-10-11 ENCOUNTER — Encounter: Payer: Self-pay | Admitting: Cardiovascular Disease

## 2013-10-19 ENCOUNTER — Telehealth: Payer: Self-pay | Admitting: Cardiovascular Disease

## 2013-10-19 NOTE — Telephone Encounter (Signed)
Told to continue Eliquis for teeth cleaning.  Patient voiced understanding.

## 2013-10-19 NOTE — Telephone Encounter (Signed)
Have a teeth cleaning on next week and wants to know if he needs to do anything before that , because he is on Eliquis .Marland Kitchen.  Please call  Thanks

## 2013-10-25 ENCOUNTER — Other Ambulatory Visit: Payer: Self-pay | Admitting: Cardiovascular Disease

## 2013-10-25 LAB — COMPREHENSIVE METABOLIC PANEL
ALBUMIN: 4.4 g/dL (ref 3.5–5.2)
ALT: 24 U/L (ref 0–53)
AST: 22 U/L (ref 0–37)
Alkaline Phosphatase: 34 U/L — ABNORMAL LOW (ref 39–117)
BUN: 24 mg/dL — ABNORMAL HIGH (ref 6–23)
CALCIUM: 9.5 mg/dL (ref 8.4–10.5)
CHLORIDE: 104 meq/L (ref 96–112)
CO2: 26 mEq/L (ref 19–32)
Creat: 0.99 mg/dL (ref 0.50–1.35)
Glucose, Bld: 119 mg/dL — ABNORMAL HIGH (ref 70–99)
POTASSIUM: 4.6 meq/L (ref 3.5–5.3)
SODIUM: 137 meq/L (ref 135–145)
Total Bilirubin: 0.4 mg/dL (ref 0.2–1.2)
Total Protein: 6.9 g/dL (ref 6.0–8.3)

## 2013-10-25 LAB — TSH: TSH: 3.125 u[IU]/mL (ref 0.350–4.500)

## 2013-10-25 LAB — CBC
HCT: 40.4 % (ref 39.0–52.0)
Hemoglobin: 13.6 g/dL (ref 13.0–17.0)
MCH: 29.6 pg (ref 26.0–34.0)
MCHC: 33.7 g/dL (ref 30.0–36.0)
MCV: 88 fL (ref 78.0–100.0)
Platelets: 279 10*3/uL (ref 150–400)
RBC: 4.59 MIL/uL (ref 4.22–5.81)
RDW: 14.6 % (ref 11.5–15.5)
WBC: 6.9 10*3/uL (ref 4.0–10.5)

## 2013-10-25 LAB — HEMOGLOBIN A1C
HEMOGLOBIN A1C: 6.1 % — AB (ref <5.7)
MEAN PLASMA GLUCOSE: 128 mg/dL — AB (ref <117)

## 2013-10-26 ENCOUNTER — Encounter: Payer: Self-pay | Admitting: *Deleted

## 2013-10-26 LAB — NMR LIPOPROFILE WITH LIPIDS
Cholesterol, Total: 152 mg/dL (ref <200)
HDL PARTICLE NUMBER: 37.9 umol/L (ref >=30.5)
HDL Size: 9 nm — ABNORMAL LOW (ref >=9.2)
HDL-C: 55 mg/dL (ref >=40)
LARGE HDL: 7.6 umol/L (ref >=4.8)
LDL (calc): 78 mg/dL (ref <100)
LDL Particle Number: 799 nmol/L (ref <1000)
LDL SIZE: 20.9 nm (ref >20.5)
LP-IR Score: 37 (ref <=45)
Large VLDL-P: 2.5 nmol/L (ref <=2.7)
Small LDL Particle Number: 275 nmol/L (ref <=527)
Triglycerides: 96 mg/dL (ref <150)
VLDL Size: 41.4 nm (ref <=46.6)

## 2013-10-27 ENCOUNTER — Encounter: Payer: Self-pay | Admitting: Cardiovascular Disease

## 2013-10-27 ENCOUNTER — Ambulatory Visit (INDEPENDENT_AMBULATORY_CARE_PROVIDER_SITE_OTHER): Payer: BC Managed Care – PPO | Admitting: Cardiovascular Disease

## 2013-10-27 VITALS — BP 126/86 | HR 64 | Ht 73.0 in | Wt 216.7 lb

## 2013-10-27 DIAGNOSIS — E785 Hyperlipidemia, unspecified: Secondary | ICD-10-CM

## 2013-10-27 DIAGNOSIS — R7309 Other abnormal glucose: Secondary | ICD-10-CM

## 2013-10-27 DIAGNOSIS — E8881 Metabolic syndrome: Secondary | ICD-10-CM

## 2013-10-27 DIAGNOSIS — R7301 Impaired fasting glucose: Secondary | ICD-10-CM

## 2013-10-27 DIAGNOSIS — I480 Paroxysmal atrial fibrillation: Secondary | ICD-10-CM

## 2013-10-27 DIAGNOSIS — I4891 Unspecified atrial fibrillation: Secondary | ICD-10-CM

## 2013-10-27 NOTE — Progress Notes (Signed)
Patient ID: Victor Sanchez, male   DOB: October 27, 1940, 73 y.o.   MRN: [id number]     HPI: Victor Sanchez is a 74 year old white malewho presents for three-month followup cardiology evaluation.  Victor Sanchez developed  atypical chest pain in 1999. A nuclear perfusion study raised the possibility of focal inferior scar with possible inferolateral redistribution. Cardiac catheterization revealed normal LV function and normal coronary arteries. In 2008 a nuclear study showed diaphragmatic attenuation although the ormal interpretation was interpreted as possible ischemia.  [place] to the [place] area and has been fairly active typically walking 2-3 miles a least 5 days per week. In January he developed URI symptoms and had  significant difficulty with sleep. He also had significant deviated septum, and has had episodes with paroxysmal positional vertigo. As part of his preoperative evaluation for ENT surgery he was found to have possible abnormal heart rhythms. An ECG was done which did not reveal ectopy and he underwent his ENT surgery for with a nasal septoplasty, reduction of his inferior turbinates, and LAUP. When Dr. Erik Obey saw him  he was concerned about the possibility of atrial fibrillation.  He reestablished care with me last month.   At that time prior to a planned trip to [place] for 2 weeks I recommended that he undergo laboratory as well as wear a CardioNet monitor.  Blood work was abnormal in that his total cholesterol was 203, triglycerides 398, HDL 51, VLDL 80 LDL 72.  Magnesium was normal.  His fasting glucose was 165 and the hemoglobin A1c was elevated at 8.0 indicative of a mean plasma glucose of approximately 183.  He had normal hemoglobin and hematocrit.  A 2-D echo Doppler study revealed an ejection fraction in the 55-60%.  On his monitor, he developed recurrent episodes of paroxysmal atrial fibrillation with rates in the low 100s, but in one  instance  he developed a 10 beat run of PSVT approximately 178 beats per minute.  He was contacted regarding these results and was brought into the office on 06/23/2013.  An echo Doppler  revealed mild aortic valve sclerosis, mild tricuspid regurgitation, and mild MR, with normal systolic function.  His laboratory was suggestive of type 2 diabetes mellitus.  With his episodes of PAF noted on his monitor.  I recommended institution of low-dose Toprol initially at 25 mg and titrate this to 50 mg.  I also extended his cardiac monitor beyond the initial 10 days.  I recommended the addition of fenofibrate 145 mg in light of his significant triglyceride elevation and also suggested initiation of metformin 500 mg daily with plans for primary care followup.  He canceled his planned trip to [place].  He saw a nutritionist.  He did develop stomach cramps secondary to metformin and stop taking this.  He has been much more cognizant in his dietary habits.   The subsequent CardioNet monitor did not demonstrate any further episodes of PAF on his increased beta blocker therapy although he did have some sinus tachycardia during activity.  Repeat blood work was significantly improved with his fasting glucose improved from 165, and remotely, 202.  Liver function studies were normal.  Renal function was normal.  Over the past 3 months, he has continued to be very dedicated both with exercise, as well as improved diet.  Walking 3-4 miles per day.  He's lost approximately 25 pounds.  He is unaware of any arrhythmia.  He denies chest pain or  shortness of breath.  Followup blood work revealed marked improvement in his hemoglobin A1c from 8.0-6.1.  His lipid studies were markedly improved with triglycerides being reduced to 96 from 398.  In NMR lipoprotein showed an LDL particle number was excellent at 799, with calculated LDL 78.  HDL particle number was 37.9.  He's now no longer in resistant with an insulin resistance score 37.  Renal  function is excellent.  LFTs are normal.  Fasting glucose was 119.  He presents for followup evaluation.   Past Medical History  Diagnosis Date  . Enlarged prostate   . History of asthma     as a child  . Headache(784.0)     occ.  . Sleep apnea     2013  . Diabetes mellitus without complication   . Hyperlipidemia   . Hypertension     Past Surgical History  Procedure Laterality Date  . Cardiac catheterization  09/06/1997    normal LV function, normal coronaries (Dr. Shelva Majestic)  . Ventral hernia repair      x 2  . Tonsillectomy      as a child  . Appendectomy  1968  . Knee arthroscopy  1990'    with baker's cyst excision unsure side  . Nasopharyngoscopy  1972    cyst removal  . Nasal septoplasty w/ turbinoplasty Bilateral 05/25/2013    Procedure: NASAL SEPTOPLASTY WITH TURBINATE REDUCTION;  Surgeon: Jodi Marble, MD;  Location: [facility];  Service: ENT;  Laterality: Bilateral;  . Uvulectomy N/A 05/25/2013    Procedure: Myrtis Ser ASSISTED;  Surgeon: Jodi Marble, MD;  Location: [facility];  Service: ENT;  Laterality: N/A;  . Transthoracic echocardiogram  03/2006    EF=>55%, mild conc LVH; borderline RV/RA enlargement; borderline MVP, prolapse of anterior leaflets, mild MR; mild TR; AV mildly sclerotic  . Nm myocar perf wall motion  03/2006    bruce myoview - mild-mod perfusion defect in basal inferoseptal walls with mild reversibility at rest; mild to mod defect in mid inferior wall with mild reversibility at rest; EF 63%; intermediate risk scan    No Known Allergies  Current Outpatient Prescriptions  Medication Sig Dispense Refill  . apixaban (ELIQUIS) 5 MG TABS tablet Take 1 tablet (5 mg total) by mouth 2 (two) times daily.  60 tablet  6  . fenofibrate (TRICOR) 145 MG tablet Take 1 tablet (145 mg total) by mouth daily.  30 tablet  6  . finasteride (PROSCAR) 5 MG tablet Take 5 mg by mouth daily.      . metoprolol succinate (TOPROL-XL) 50 MG 24 hr tablet 50 mg daily.        . Multiple Vitamins-Minerals (MULTIVITAMIN WITH MINERALS) tablet Take 1 tablet by mouth daily.      . pravastatin (PRAVACHOL) 40 MG tablet Take 40 mg by mouth daily.      . tamsulosin (FLOMAX) 0.4 MG CAPS capsule Take 0.4 mg by mouth daily.       No current facility-administered medications for this visit.    Socially he is married. He has 3 children 4 grandchildren. There is no tobacco use. He did smoke for short-term and quit in 1969. He does drink alcohol.  Family History  Problem Relation Age of Onset  . Kidney failure Mother   . Heart disease Other   . Emphysema Father   . Breast cancer Sister   . Multiple myeloma Sister      ROS General: Negative; No fevers, chills, or night sweats; positive  for purposeful 6 pound weight loss HEENT: Negative; No changes in vision or hearing, sinus congestion, difficulty swallowing Pulmonary: Negative; No cough, wheezing, shortness of breath, hemoptysis Cardiovascular: Negative; No chest pain, presyncope, syncope, palpatations; no edema, appear GI: Negative; No nausea, vomiting, diarrhea, or abdominal pain GU: Negative; No dysuria, hematuria, or difficulty voiding Musculoskeletal: Negative; no myalgias, joint pain, or weakness Hematologic/Oncology: Negative; no easy bruising, bleeding Endocrine: Negative; no heat/cold intolerance; he is unaware of polydipsia or polyuria Neuro: Negative; no changes in balance, headaches Skin: Negative; No rashes or skin lesions Psychiatric: Negative; No behavioral problems, depression Sleep: Positive for sleep apnea, currently not on CPAP, but status post recent ENT surgery; No snoring, daytime sleepiness, hypersomnolence, bruxism, restless legs, hypnogognic hallucinations, no cataplexy Other comprehensive 14 point system review is negative.   PE BP 126/86  Pulse 64  Ht 6' 1"  (1.854 m)  Wt 216 lb 11.2 oz (98.294 kg)  BMI 28.60 kg/m2 General: Alert, oriented, no distress.  Skin: normal turgor, no  rashes HEENT: Normocephalic, atraumatic. Pupils round and reactive; sclera anicteric; extraocular muscles intact; Fundi  without hemorrhages or exudate  Nose without nasal septal hypertrophy; status post recent surgery for deviated septum and reduction of inferior turbinates Mouth/Parynx benign; he is status post recent laser surgery to the soft palate Neck: No JVD, no carotid bruits; normal carotid upstroke Lungs: clear to ausculatation and percussion; no wheezing or rales Chest wall: without tenderness to palpitation Heart: PMI is not displaced RRR, s1 s2 normal I do not hear any arrhythmias to prolonged auscultation. Faint 1/6 systolic murmur. No S3 gallop; no rubs thrills or heaves. Abdomen: Mild central adiposity. soft, nontender; no hepatosplenomehaly, BS+; abdominal aorta nontender and not dilated by palpation. Back: no CVA tenderness Pulses 2+ Extremities: no clubbing cyanosis or edema, Homan's sign negative  Neurologic: grossly nonfocal; Cranial nerves grossly wnl Psychologic: Normal mood and affect  ECG (independently read by me) normal sinus rhythm at 64 beats per minute.  Normal intervals.  No ST segment changes.  ECG on 06/23/13 (independently read by me) normal sinus rhythm at 63 beats per minute.  PR interval 150 ms, QTc interval 433 ms  Prior 06/15/2013 ECG (independently read by me): Normal sinus rhythm at 72 beats per minute. PR interval 144 ms; QTc interval 451 ms  LABS:  Preoperatively on 05/18/2013 . At that time his glucose was 202. Liver function studies were normal. Potassium 4.6.  BMET    Component Value Date/Time   NA 137 10/25/2013 0804   K 4.6 10/25/2013 0804   CL 104 10/25/2013 0804   CO2 26 10/25/2013 0804   GLUCOSE 119* 10/25/2013 0804   BUN 24* 10/25/2013 0804   CREATININE 0.99 10/25/2013 0804   CREATININE 0.76 05/18/2013 0839   CALCIUM 9.5 10/25/2013 0804   GFRNONAA 89* 05/18/2013 0839   GFRAA >90 05/18/2013 0839     Hepatic Function Panel     Component  Value Date/Time   PROT 6.9 10/25/2013 0804   ALBUMIN 4.4 10/25/2013 0804   AST 22 10/25/2013 0804   ALT 24 10/25/2013 0804   ALKPHOS 34* 10/25/2013 0804   BILITOT 0.4 10/25/2013 0804     CBC    Component Value Date/Time   WBC 6.9 10/25/2013 0804   RBC 4.59 10/25/2013 0804   HGB 13.6 10/25/2013 0804   HCT 40.4 10/25/2013 0804   PLT 279 10/25/2013 0804   MCV 88.0 10/25/2013 0804   MCH 29.6 10/25/2013 0804   MCHC 33.7 10/25/2013 0804  RDW 14.6 10/25/2013 0804     BNP No results found for this basename: probnp    Lipid Panel     Component Value Date/Time   CHOL 203* 06/15/2013 1005   TRIG 96 10/25/2013 0804   TRIG 398* 06/15/2013 1005   HDL 51 06/15/2013 1005   CHOLHDL 4.0 06/15/2013 1005   VLDL 80* 06/15/2013 1005   LDLCALC 78 10/25/2013 0804   LDLCALC 72 06/15/2013 1005     RADIOLOGY: No results found.   ASSESSMENT AND PLAN:  Victor Sanchez is a 73 year old gentleman who had documented normal coronary arteries and normal LV function at cardiac catheterization in 1999 after a nuclear perfusion study raised the possibility of subtle ischemia.  Earlier this year, he had developed overt diabetes, and also had episodes of paroxysmal atrial fibrillation.  I commended him on his dedicated lifestyle adjustment.  He's now lost approximately 25.  His hemoglobin A1c is markedly improved and he now is in the metabolic syndrome category.  He is tolerating pravastatin 40 mg in addition to fetal fibrin 145 mg with marked improvement in his triglyceride levels.  He is tolerating metoprolol succinate 50 mg without recurrent PAF.  He continues to be on Eliquis 5 mg twice a day for anticoagulation.  He still has additional weight to lose.  He believes he is sleeping better and is no longer snoring.  I will see him in 6 months for followup evaluation or sooner if problems arise.    Troy Sine, MD, [facility] 10/27/2013 10:08 AM

## 2013-10-27 NOTE — Patient Instructions (Signed)
Your physician recommends that you schedule a follow-up appointment in: 6 months. No changes were made today in your therapy. 

## 2013-10-28 ENCOUNTER — Encounter: Payer: Self-pay | Admitting: Cardiovascular Disease

## 2013-10-28 DIAGNOSIS — E8881 Metabolic syndrome: Secondary | ICD-10-CM | POA: Insufficient documentation

## 2014-01-17 ENCOUNTER — Other Ambulatory Visit: Payer: Self-pay | Admitting: Cardiovascular Disease

## 2014-01-18 NOTE — Telephone Encounter (Signed)
Rx was sent to pharmacy electronically. 

## 2014-02-16 ENCOUNTER — Ambulatory Visit
Admission: RE | Admit: 2014-02-16 | Discharge: 2014-02-16 | Disposition: A | Discharge status: Home / Self Care | Payer: BC Managed Care – PPO | Source: Ambulatory Visit | Attending: Nurse Practitioner | Admitting: Nurse Practitioner

## 2014-02-16 ENCOUNTER — Other Ambulatory Visit: Payer: Self-pay | Admitting: Nurse Practitioner

## 2014-02-16 DIAGNOSIS — R05 Cough: Secondary | ICD-10-CM

## 2014-02-16 DIAGNOSIS — R059 Cough, unspecified: Secondary | ICD-10-CM

## 2014-02-17 ENCOUNTER — Other Ambulatory Visit: Payer: Self-pay | Admitting: Cardiovascular Disease

## 2014-02-17 NOTE — Telephone Encounter (Signed)
Rx was sent to pharmacy electronically. 

## 2014-03-08 ENCOUNTER — Encounter (INDEPENDENT_AMBULATORY_CARE_PROVIDER_SITE_OTHER): Payer: BC Managed Care – PPO | Admitting: Ophthalmology

## 2014-03-08 DIAGNOSIS — H3562 Retinal hemorrhage, left eye: Secondary | ICD-10-CM

## 2014-03-08 DIAGNOSIS — H33301 Unspecified retinal break, right eye: Secondary | ICD-10-CM

## 2014-03-08 DIAGNOSIS — H43813 Vitreous degeneration, bilateral: Secondary | ICD-10-CM

## 2014-04-19 ENCOUNTER — Ambulatory Visit (INDEPENDENT_AMBULATORY_CARE_PROVIDER_SITE_OTHER): Payer: BLUE CROSS/BLUE SHIELD | Admitting: Cardiovascular Disease

## 2014-04-19 ENCOUNTER — Encounter (INDEPENDENT_AMBULATORY_CARE_PROVIDER_SITE_OTHER): Payer: BC Managed Care – PPO | Admitting: Ophthalmology

## 2014-04-19 ENCOUNTER — Telehealth: Payer: Self-pay | Admitting: Cardiovascular Disease

## 2014-04-19 ENCOUNTER — Encounter: Payer: Self-pay | Admitting: Cardiovascular Disease

## 2014-04-19 VITALS — BP 120/84 | HR 61 | Ht 73.0 in | Wt 218.3 lb

## 2014-04-19 DIAGNOSIS — G4733 Obstructive sleep apnea (adult) (pediatric): Secondary | ICD-10-CM

## 2014-04-19 DIAGNOSIS — E8881 Metabolic syndrome: Secondary | ICD-10-CM

## 2014-04-19 DIAGNOSIS — E785 Hyperlipidemia, unspecified: Secondary | ICD-10-CM

## 2014-04-19 DIAGNOSIS — Z79899 Other long term (current) drug therapy: Secondary | ICD-10-CM

## 2014-04-19 DIAGNOSIS — I480 Paroxysmal atrial fibrillation: Secondary | ICD-10-CM

## 2014-04-19 NOTE — Telephone Encounter (Signed)
[name] is having issues with his AFIB , It seems to be happening more Frequently and wants to come in to see Dr. Tresa EndoKelly . Please call    Thanks

## 2014-04-19 NOTE — Progress Notes (Addendum)
Patient ID: Victor Sanchez, male   DOB: [birth year], 74 y.o.   MRN: [id number]     HPI: Victor Sanchez is a 74 year old white male who presents for a followup cardiology evaluation with a chief complaint of occasional episodes of chest spasms and possible intermittent heartbeat irregularity..  Victor Sanchez developed  atypical chest pain in 1999. A nuclear perfusion study raised the possibility of focal inferior scar with possible inferolateral redistribution. Cardiac catheterization revealed normal LV function and normal coronary arteries. In 2008 a nuclear study showed diaphragmatic attenuation although the ormal interpretation was interpreted as possible ischemia. In January 2015 he developed URI symptoms and had  significant difficulty with sleep. He also had significant deviated septum, and has had episodes with paroxysmal positional vertigo. As part of his preoperative evaluation for ENT surgery he was found to have possible abnormal heart rhythms. He underwent his ENT surgery for with a nasal septoplasty, reduction of his inferior turbinates, and LAUP. When Dr. Erik Obey saw him  he was concerned about the possibility of atrial fibrillation.  He reestablished care with me last year following this episode.  At that time prior to a planned trip to [place] for 2 weeks I recommended that he undergo laboratory as well as wear a CardioNet monitor.  Blood work was abnormal in that his total cholesterol was 203, triglycerides 398, HDL 51, VLDL 80 LDL 72.  Magnesium was normal.  His fasting glucose was 165 and the hemoglobin A1c was elevated at 8.0 indicative of a mean plasma glucose of approximately 183.  He had normal hemoglobin and hematocrit.  A 2-D echo Doppler study revealed an ejection fraction in the 55-60%.  On his monitor, he developed recurrent episodes of paroxysmal atrial fibrillation with rates in the low 100s, but in one instance  he developed a 10 beat run of PSVT approximately 178 beats per minute.  He was  contacted regarding these results and was brought into the office on 06/23/2013.  An echo Doppler  revealed mild aortic valve sclerosis, mild tricuspid regurgitation, and mild MR, with normal systolic function.  His laboratory was suggestive of type 2 diabetes mellitus.  With his episodes of PAF noted on his monitor.  I recommended institution of low-dose Toprol initially at 25 mg and titrate this to 50 mg.  I also extended his cardiac monitor beyond the initial 10 days.  I recommended the addition of fenofibrate 145 mg in light of his significant triglyceride elevation and also suggested initiation of metformin 500 mg daily with plans for primary care followup.  The subsequent CardioNet monitor did not demonstrate any further episodes of PAF on his increased beta blocker therapy although he did have some sinus tachycardia during activity.  Repeat blood work was significantly improved with his fasting glucose improved from 165, and remotely, 202.  Liver function studies were normal.  Renal function was normal.  When I last saw him in August 2015 he had continued to be very dedicated both with exercise, as well as improved diet.  He was walking 3-4 miles per day and lost approximately 25 pounds.  He was unaware of any arrhythmia.  He denies chest pain or shortness of breath.  Followup blood work revealed marked improvement in his hemoglobin A1c from 8.0-6.1.  His lipid studies were markedly improved with triglycerides being reduced to 96 from 398.  An NMR lipoprotein showed an LDL particle number was excellent at 799, with calculated LDL 78.  HDL particle number was 37.9.  He  was no longer in resistant with an insulin resistance score 37.    Over the past several weeks, however, he has noticed the sensation that his chest would quiver on the left and he would notice some occasional pounding from within.  He was uncertainty was having potential breakthrough atrial fibrillation or for these were just muscle  spasms.  They have been occurring daily.  The episodes would last 5-6 seconds and then resolve.  He denies associated presyncope or syncope.  He denies any nocturnal symptoms.  Because of his concerns  he presents now for evaluation. He denies any change in exercise tolerance.  He denies any significant additional weight gain since his weight loss last year.  Past Medical History  Diagnosis Date  . Enlarged prostate   . History of asthma     as a child  . Headache(784.0)     occ.  . Sleep apnea     2013  . Diabetes mellitus without complication   . Hyperlipidemia   . Hypertension     Past Surgical History  Procedure Laterality Date  . Cardiac catheterization  09/06/1997    normal LV function, normal coronaries (Dr. Shelva Majestic)  . Ventral hernia repair      x 2  . Tonsillectomy      as a child  . Appendectomy  1968  . Knee arthroscopy  1990'    with baker's cyst excision unsure side  . Nasopharyngoscopy  1972    cyst removal  . Nasal septoplasty w/ turbinoplasty Bilateral 05/25/2013    Procedure: NASAL SEPTOPLASTY WITH TURBINATE REDUCTION;  Surgeon: Jodi Marble, MD;  Location: [facility];  Service: ENT;  Laterality: Bilateral;  . Uvulectomy N/A 05/25/2013    Procedure: Myrtis Ser ASSISTED;  Surgeon: Jodi Marble, MD;  Location: [facility];  Service: ENT;  Laterality: N/A;  . Transthoracic echocardiogram  03/2006    EF=>55%, mild conc LVH; borderline RV/RA enlargement; borderline MVP, prolapse of anterior leaflets, mild MR; mild TR; AV mildly sclerotic  . Nm myocar perf wall motion  03/2006    bruce myoview - mild-mod perfusion defect in basal inferoseptal walls with mild reversibility at rest; mild to mod defect in mid inferior wall with mild reversibility at rest; EF 63%; intermediate risk scan    No Known Allergies  Current Outpatient Prescriptions  Medication Sig Dispense Refill  . ELIQUIS 5 MG TABS tablet TAKE 1 TABLET BY MOUTH TWICE DAILY. 60 tablet 5  . fenofibrate  (TRICOR) 145 MG tablet TAKE 1 TABLET BY MOUTH EVERY DAY. 30 tablet 9  . finasteride (PROSCAR) 5 MG tablet Take 5 mg by mouth daily.    . metoprolol succinate (TOPROL-XL) 50 MG 24 hr tablet 50 mg daily.    . Multiple Vitamins-Minerals (MULTIVITAMIN WITH MINERALS) tablet Take 1 tablet by mouth daily.    . pravastatin (PRAVACHOL) 40 MG tablet Take 40 mg by mouth daily.    . tamsulosin (FLOMAX) 0.4 MG CAPS capsule Take 0.4 mg by mouth daily.     No current facility-administered medications for this visit.    Socially he is married. He has 3 children 4 grandchildren. There is no tobacco use. He did smoke for short-term and quit in 1969. He does drink alcohol.  Family History  Problem Relation Age of Onset  . Kidney failure Mother   . Heart disease Other   . Emphysema Father   . Breast cancer Sister   . Multiple myeloma Sister      ROS  General: Negative; No fevers, chills, or night sweats; positive for purposeful 6 pound weight loss HEENT: Negative; No changes in vision or hearing, sinus congestion, difficulty swallowing Pulmonary: Negative; No cough, wheezing, shortness of breath, hemoptysis Cardiovascular: See history of present illness GI: Negative; No nausea, vomiting, diarrhea, or abdominal pain GU: Negative; No dysuria, hematuria, or difficulty voiding Musculoskeletal: Negative; no myalgias, joint pain, or weakness Hematologic/Oncology: Negative; no easy bruising, bleeding Endocrine: Negative; no heat/cold intolerance; he is unaware of polydipsia or polyuria Neuro: Negative; no changes in balance, headaches Skin: Negative; No rashes or skin lesions Psychiatric: Negative; No behavioral problems, depression Sleep: Positive for sleep apnea, currently not on CPAP, but status post recent ENT surgery; No snoring, daytime sleepiness, hypersomnolence, bruxism, restless legs, hypnogognic hallucinations, no cataplexy Other comprehensive 14 point system review is negative.   PE BP 120/84  mmHg  Pulse 61  Ht 6' 1"  (1.854 m)  Wt 218 lb 4.8 oz (99.02 kg)  BMI 28.81 kg/m2 General: Alert, oriented, no distress.  Skin: normal turgor, no rashes HEENT: Normocephalic, atraumatic. Pupils round and reactive; sclera anicteric; extraocular muscles intact; Fundi  without hemorrhages or exudate  Nose without nasal septal hypertrophy; status post recent surgery for deviated septum and reduction of inferior turbinates Mouth/Parynx benign; he is status post recent laser surgery to the soft palate Neck: No JVD, no carotid bruits; normal carotid upstroke Lungs: clear to ausculatation and percussion; no wheezing or rales Chest wall: without tenderness to palpitation Heart: PMI is not displaced RRR, s1 s2 normal I do not hear any arrhythmias on prolonged auscultation. Faint 1/6 systolic murmur. No S3 gallop; no rubs thrills or heaves. Abdomen: Mild central adiposity. soft, nontender; no hepatosplenomehaly, BS+; abdominal aorta nontender and not dilated by palpation. Back: no CVA tenderness Pulses 2+ Extremities: no clubbing cyanosis or edema, Homan's sign negative  Neurologic: grossly nonfocal; Cranial nerves grossly wnl Psychologic: Normal mood and affect  ECG (independently read by me): Normal sinus rhythm at 61 bpm.  One isolated PVC.  No significant ST segment changes.  Normal intervals.  ECG (independently read by me) normal sinus rhythm at 64 beats per minute.  Normal intervals.  No ST segment changes.  ECG on 06/23/13 (independently read by me) normal sinus rhythm at 63 beats per minute.  PR interval 150 ms, QTc interval 433 ms  Prior 06/15/2013 ECG (independently read by me): Normal sinus rhythm at 72 beats per minute. PR interval 144 ms; QTc interval 451 ms  BMET  BMP Latest Ref Rng 10/25/2013 07/14/2013 06/15/2013  Glucose 70 - 99 mg/dL 119(H) 125(H) 165(H)  BUN 6 - 23 mg/dL 24(H) 17 12  Creatinine 0.50 - 1.35 mg/dL 0.99 0.93 0.85  Sodium 135 - 145 mEq/L 137 140 138  Potassium 3.5 -  5.3 mEq/L 4.6 4.6 4.6  Chloride 96 - 112 mEq/L 104 104 100  CO2 19 - 32 mEq/L 26 28 25   Calcium 8.4 - 10.5 mg/dL 9.5 9.5 9.6    Hepatic Function Panel     Component Value Date/Time   PROT 6.9 10/25/2013 0804   ALBUMIN 4.4 10/25/2013 0804   AST 22 10/25/2013 0804   ALT 24 10/25/2013 0804   ALKPHOS 34* 10/25/2013 0804   BILITOT 0.4 10/25/2013 0804     CBC    Component Value Date/Time   WBC 6.9 10/25/2013 0804   RBC 4.59 10/25/2013 0804   HGB 13.6 10/25/2013 0804   HCT 40.4 10/25/2013 0804   PLT 279 10/25/2013 0804   MCV  88.0 10/25/2013 0804   MCH 29.6 10/25/2013 0804   MCHC 33.7 10/25/2013 0804   RDW 14.6 10/25/2013 0804     BNP No results found for: PROBNP  Lipid Panel     Component Value Date/Time   CHOL 203* 06/15/2013 1005   TRIG 96 10/25/2013 0804   TRIG 398* 06/15/2013 1005   HDL 51 06/15/2013 1005   CHOLHDL 4.0 06/15/2013 1005   VLDL 80* 06/15/2013 1005   LDLCALC 78 10/25/2013 0804   LDLCALC 72 06/15/2013 1005    NMR Profile 10/25/2013   Ref Range 73moago  154mogo      LDL Particle Number <1000 nmol/L 799    Comments: Reference Range  Low < 1000  Moderate 1000 - 1299  Borderline-High 1300 - 1599  High 1600 - 2000  Very High > 2000     LDL (calc) <100 mg/dL 78 72R, CM   Comments: LDL-C is inaccurate if patient is nonfasting. Reference Range  Optimal < 100  Near/Above Optimal 100 - 129  Borderline High 130 - 159  High 160 - 189  Very High >= 190     HDL-C >=40 mg/dL 55     Triglycerides <150 mg/dL 96 398 (H)    Cholesterol, Total <200 mg/dL 152     HDL Particle Number >=30.5 umol/L 37.9     Large HDL-P >=4.8 umol/L 7.6     Large VLDL-P <=2.7 nmol/L 2.5     Small LDL Particle Number <=527 nmol/L 275     LDL Size >20.5 nm 20.9     HDL Size >=9.2 nm 9.0 (L)     VLDL Size <=46.6 nm 41.4     LP-IR Score <=45  37       RADIOLOGY: No results found.   ASSESSMENT AND PLAN:  Victor Sanchez a 89ear old  gentleman who had documented normal coronary arteries and normal LV function at cardiac catheterization in 1999 after a nuclear perfusion study raised the possibility of subtle ischemia.  Last year, he had developed overt diabetes, and also had episodes of paroxysmal atrial fibrillation.  With significant lifestyle adjustment he lost approximately 25.  His hemoglobin A1c is markedly improved and he now is in the metabolic syndrome category.  His blood pressure today is stable at 120/84.  He has been on Toprol 50 mg daily.  One isolated PVC was noted on his ECG.  He has been experiencing some chest fluttering, but I am uncertain if this is pectoral muscle fasciculation versus arrhythmia.  He has been on anticoagulation with Eliquis 5 mg twice a day without side effects.  He also continues on fenofibrate145 mg in addition to pravastatin 40 mg for his mixed hyperlipidemia.  I am recommending follow-up laboratory be obtained consisting of a comprehensive metabolic panel, magnesium level, TSH, and lipid studies.  I'm scheduling him to wear a two-week event monitor for further evaluation of his palpitations and sensation.  He already has a scheduled appointment to see me on 05/10/2014 and I will see him at that time for further evaluation.  Time spent: 25 minutes  ThTroy SineMD, FANorth [facility]/05/2014 4:00 PM

## 2014-04-19 NOTE — Telephone Encounter (Signed)
Returned call to patient.He stated he is having frequent atrial fib.[facility] and would like to see him sooner.Appointment scheduled with Dr.Kelly this afternoon at 3:30 pm.

## 2014-04-19 NOTE — Patient Instructions (Signed)
Your physician has recommended that you wear an event monitor. Event monitors are medical devices that record the heart's electrical activity. Doctors most often us these monitors to diagnose arrhythmias. Arrhythmias are problems with the speed or rhythm of the heartbeat. The monitor is a small, portable device. You can wear one while you do your normal daily activities. This is usually used to diagnose what is causing palpitations/syncope (passing out). This will be worn for 2 weeks.  Your physician recommends that you return for lab work fasting prior to next visit.  Your physician recommends that you schedule a follow-up appointment in: keep appointment with Dr. Tresa EndoKelly on 05/10/14

## 2014-04-20 ENCOUNTER — Encounter: Payer: Self-pay | Admitting: Cardiovascular Disease

## 2014-05-02 DIAGNOSIS — I480 Paroxysmal atrial fibrillation: Secondary | ICD-10-CM

## 2014-05-05 LAB — CBC
HCT: 42.5 % (ref 39.0–52.0)
HEMOGLOBIN: 14.2 g/dL (ref 13.0–17.0)
MCH: 30 pg (ref 26.0–34.0)
MCHC: 33.4 g/dL (ref 30.0–36.0)
MCV: 89.9 fL (ref 78.0–100.0)
MPV: 10.4 fL (ref 8.6–12.4)
PLATELETS: 267 10*3/uL (ref 150–400)
RBC: 4.73 MIL/uL (ref 4.22–5.81)
RDW: 14.4 % (ref 11.5–15.5)
WBC: 8 10*3/uL (ref 4.0–10.5)

## 2014-05-05 LAB — LIPID PANEL
Cholesterol: 171 mg/dL (ref 0–200)
HDL: 57 mg/dL (ref >39)
LDL CALC: 96 mg/dL (ref 0–99)
Total CHOL/HDL Ratio: 3 Ratio
Triglycerides: 90 mg/dL (ref <150)
VLDL: 18 mg/dL (ref 0–40)

## 2014-05-05 LAB — COMPREHENSIVE METABOLIC PANEL
ALK PHOS: 34 U/L — AB (ref 39–117)
ALT: 24 U/L (ref 0–53)
AST: 21 U/L (ref 0–37)
Albumin: 4.5 g/dL (ref 3.5–5.2)
BILIRUBIN TOTAL: 0.5 mg/dL (ref 0.2–1.2)
BUN: 25 mg/dL — ABNORMAL HIGH (ref 6–23)
CALCIUM: 9.5 mg/dL (ref 8.4–10.5)
CHLORIDE: 106 meq/L (ref 96–112)
CO2: 25 mEq/L (ref 19–32)
Creat: 0.94 mg/dL (ref 0.50–1.35)
Glucose, Bld: 126 mg/dL — ABNORMAL HIGH (ref 70–99)
Potassium: 4.5 mEq/L (ref 3.5–5.3)
Sodium: 141 mEq/L (ref 135–145)
TOTAL PROTEIN: 6.8 g/dL (ref 6.0–8.3)

## 2014-05-05 LAB — MAGNESIUM: Magnesium: 1.9 mg/dL (ref 1.5–2.5)

## 2014-05-06 LAB — TSH: TSH: 4.704 u[IU]/mL — ABNORMAL HIGH (ref 0.350–4.500)

## 2014-05-10 ENCOUNTER — Encounter: Payer: Self-pay | Admitting: Cardiovascular Disease

## 2014-05-10 ENCOUNTER — Ambulatory Visit (INDEPENDENT_AMBULATORY_CARE_PROVIDER_SITE_OTHER): Payer: BLUE CROSS/BLUE SHIELD | Admitting: Cardiovascular Disease

## 2014-05-10 VITALS — BP 120/78 | HR 64 | Ht 73.0 in | Wt 219.3 lb

## 2014-05-10 DIAGNOSIS — R002 Palpitations: Secondary | ICD-10-CM

## 2014-05-10 DIAGNOSIS — G4733 Obstructive sleep apnea (adult) (pediatric): Secondary | ICD-10-CM

## 2014-05-10 DIAGNOSIS — E8881 Metabolic syndrome: Secondary | ICD-10-CM

## 2014-05-10 DIAGNOSIS — I480 Paroxysmal atrial fibrillation: Secondary | ICD-10-CM

## 2014-05-10 NOTE — Patient Instructions (Signed)
Your physician wants you to follow-up in: 6 months or sooner if needed with Dr. Kelly. You will receive a reminder letter in the mail two months in advance. If you don't receive a letter, please call our office to schedule the follow-up appointment. 

## 2014-05-10 NOTE — Progress Notes (Addendum)
Patient ID: Victor Sanchez, male   DOB: 1941-02-23, 74 y.o.   MRN: [id number]   HPI: [name] is a 74 year old white male who presents for a 3 week followup cardiology evaluation after being seen several weeks ago with complaints of occasional episodes of chest spasms and possible intermittent heartbeat irregularity.  [name] developed  atypical chest pain in 1999. A nuclear perfusion study raised the possibility of focal inferior scar with possible inferolateral redistribution. Cardiac catheterization revealed normal LV function and normal coronary arteries. In 2008 a nuclear study showed diaphragmatic attenuation although the ormal interpretation was interpreted as possible ischemia. In January 2015 he developed URI symptoms and had  significant difficulty with sleep. He also had significant deviated septum, and has had episodes with paroxysmal positional vertigo. As part of his preoperative evaluation for ENT surgery he was found to have possible abnormal heart rhythms. He underwent his ENT surgery for with a nasal septoplasty, reduction of his inferior turbinates, and LAUP. When Dr. Erik Sanchez saw him  he was concerned about the possibility of atrial fibrillation.  He reestablished care with me last year following this episode.  At that time prior to a planned trip to [place] for 2 weeks I recommended that he undergo laboratory as well as wear a CardioNet monitor.  Blood work was abnormal in that his total cholesterol was 203, triglycerides 398, HDL 51, VLDL 80 LDL 72.  Magnesium was normal.  His fasting glucose was 165 and the hemoglobin A1c was elevated at 8.0 indicative of a mean plasma glucose of approximately 183.  He had normal hemoglobin and hematocrit.  A 2-D echo Doppler study revealed an ejection fraction in the 55-60%.  On his monitor, he developed recurrent episodes of paroxysmal atrial fibrillation with rates in the low 100s, but in one instance  he developed a 10 beat run of PSVT approximately 74 beats per minute.  He was contacted regarding these results and was brought into the office on 06/23/2013.  An echo Doppler  revealed mild aortic valve sclerosis, mild tricuspid regurgitation, and mild MR, with normal systolic function.  His laboratory was suggestive of type 2 diabetes mellitus.  With his episodes of PAF noted on his monitor.  I recommended institution of low-dose Toprol initially at 25 mg and titrate this to 50 mg.  I also extended his cardiac monitor beyond the initial 10 days.  I recommended the addition of fenofibrate 145 mg in light of his significant triglyceride elevation and also suggested initiation of metformin 500 mg daily with plans for primary care followup.  The subsequent CardioNet monitor did not demonstrate any further episodes of PAF on his increased beta blocker therapy although he did have some sinus tachycardia during activity.  Repeat blood work was significantly improved with his fasting glucose improved from 165, and remotely, 202.  Liver function studies were normal.  Renal function was normal.  When I last saw him in August 2015 he had continued to be very dedicated both with exercise, as well as improved diet.  He was walking 3-4 miles per day and lost approximately 25 pounds.  He was unaware of any arrhythmia.  He denies chest pain or shortness of breath.  Followup blood work revealed marked improvement in his hemoglobin A1c from 8.0-6.1.  His lipid studies were markedly improved with triglycerides being reduced to 96 from 398.  An NMR lipoprotein showed an LDL particle number was excellent at 799, with calculated LDL 78.  HDL particle number  was 37.9.  He was no longer in resistant with an insulin resistance score 37.    When I saw him several weeks ago as an add-on he complained that his chest would quiver on the left and he would notice some occasional pounding from within.  He was uncertain if he was having potential breakthrough atrial fibrillation or for  these were just muscle spasms.  They have been occurring daily.  The episodes would last 5-6 seconds and then resolve.  He denies associated presyncope or syncope.  He denies any nocturnal symptoms.  At that time, I felt most likely this symptomatology was due to pectoralis muscle fasciculations rather than recurrent AF.  He wore a 2 week event monitor from 04/19/2014 through 05/02/2014.  This did not demonstrate any significant arrhythmia, but showed predominantly sinus rhythm with sinus bradycardia and rare PACs and rare isolated PVCs.  Assessment laboratory was also done which I reviewed with him in detail today.  His hemoglobin and hematocrit were stable.  Fasting glucose was mildly increased at 126.  Normal renal function and electrolytes.  TSH was borderline increased at 4.7.  His studies were normal.  Lipid studies continue to be excellent with a total cholesterol of 171, triglycerides 90, HDL 57, and LDL 96, on his current therapy.  He presents for follow-up evaluation  Past Medical History  Diagnosis Date  . Enlarged prostate   . History of asthma     as a child  . Headache(784.0)     occ.  . Sleep apnea     2013  . Diabetes mellitus without complication   . Hyperlipidemia   . Hypertension     Past Surgical History  Procedure Laterality Date  . Cardiac catheterization  09/06/1997    normal LV function, normal coronaries (Dr. Shelva Sanchez)  . Ventral hernia repair      x 2  . Tonsillectomy      as a child  . Appendectomy  1968  . Knee arthroscopy  1990'    with baker's cyst excision unsure side  . Nasopharyngoscopy  1972    cyst removal  . Nasal septoplasty w/ turbinoplasty Bilateral 05/25/2013    Procedure: NASAL SEPTOPLASTY WITH TURBINATE REDUCTION;  Surgeon: Victor Marble, MD;  Location: [facility];  Service: ENT;  Laterality: Bilateral;  . Uvulectomy N/A 05/25/2013    Procedure: Victor Sanchez ASSISTED;  Surgeon: Victor Marble, MD;  Location: [facility];  Service: ENT;  Laterality:  N/A;  . Transthoracic echocardiogram  03/2006    EF=>55%, mild conc LVH; borderline RV/RA enlargement; borderline MVP, prolapse of anterior leaflets, mild MR; mild TR; AV mildly sclerotic  . Nm myocar perf wall motion  03/2006    bruce myoview - mild-mod perfusion defect in basal inferoseptal walls with mild reversibility at rest; mild to mod defect in mid inferior wall with mild reversibility at rest; EF 63%; intermediate risk scan    No Known Allergies  Current Outpatient Prescriptions  Medication Sig Dispense Refill  . ELIQUIS 5 MG TABS tablet TAKE 1 TABLET BY MOUTH TWICE DAILY. 60 tablet 5  . fenofibrate (TRICOR) 145 MG tablet TAKE 1 TABLET BY MOUTH EVERY DAY. 30 tablet 9  . finasteride (PROSCAR) 5 MG tablet Take 5 mg by mouth daily.    . metoprolol succinate (TOPROL-XL) 50 MG 24 hr tablet 50 mg daily.    . Multiple Vitamins-Minerals (MULTIVITAMIN WITH MINERALS) tablet Take 1 tablet by mouth daily.    . pravastatin (PRAVACHOL) 40 MG tablet  Take 40 mg by mouth daily.    . tamsulosin (FLOMAX) 0.4 MG CAPS capsule Take 0.4 mg by mouth daily.     No current facility-administered medications for this visit.    Socially he is married. He has 3 children 4 grandchildren. There is no tobacco use. He did smoke for short-term and quit in 1969. He does drink alcohol.  Family History  Problem Relation Age of Onset  . Kidney failure Mother   . Heart disease Other   . Emphysema Father   . Breast cancer Sister   . Multiple myeloma Sister      ROS General: Negative; No fevers, chills, or night sweats; positive for purposeful 6 pound weight loss HEENT: Negative; No changes in vision or hearing, sinus congestion, difficulty swallowing Pulmonary: Negative; No cough, wheezing, shortness of breath, hemoptysis Cardiovascular: See history of present illness GI: Negative; No nausea, vomiting, diarrhea, or abdominal pain GU: Negative; No dysuria, hematuria, or difficulty voiding Musculoskeletal:  Negative; no myalgias, joint pain, or weakness Hematologic/Oncology: Negative; no easy bruising, bleeding Endocrine: Negative; no heat/cold intolerance; he is unaware of polydipsia or polyuria Neuro: Negative; no changes in balance, headaches Skin: Negative; No rashes or skin lesions Psychiatric: Negative; No behavioral problems, depression Sleep: Positive for sleep apnea, currently not on CPAP, but status post recent ENT surgery; No snoring, daytime sleepiness, hypersomnolence, bruxism, restless legs, hypnogognic hallucinations, no cataplexy Other comprehensive 14 point system review is negative.   PE BP 120/78 mmHg  Pulse 64  Ht 6' 1"  (1.854 m)  Wt 219 lb 4.8 oz (99.474 kg)  BMI 28.94 kg/m2 General: Alert, oriented, no distress.  Skin: normal turgor, no rashes HEENT: Normocephalic, atraumatic. Pupils round and reactive; sclera anicteric; extraocular muscles intact; Fundi  without hemorrhages or exudate  Nose without nasal septal hypertrophy; status post recent surgery for deviated septum and reduction of inferior turbinates Mouth/Parynx benign; he is status post recent laser surgery to the soft palate Neck: No JVD, no carotid bruits; normal carotid upstroke Lungs: clear to ausculatation and percussion; no wheezing or rales Chest wall: without tenderness to palpitation Heart: PMI is not displaced RRR, s1 s2 normal I do not hear any arrhythmias on prolonged auscultation. Faint 1/6 systolic murmur. No S3 gallop; no rubs thrills or heaves. Abdomen: Mild central adiposity. soft, nontender; no hepatosplenomehaly, BS+; abdominal aorta nontender and not dilated by palpation. Back: no CVA tenderness Pulses 2+ Extremities: no clubbing cyanosis or edema, Homan's sign negative  Neurologic: grossly nonfocal; Cranial nerves grossly wnl Psychologic: Normal mood and affect  ECG (independently read by me): Sinus rhythm with sinus arrhythmia and  PACs with an atrial bigeminal rhythm  04/19/2014 ECG  (independently read by me): Normal sinus rhythm at 61 bpm.  One isolated PVC.  No significant ST segment changes.  Normal intervals.  Prior ECG (independently read by me) normal sinus rhythm at 64 beats per minute.  Normal intervals.  No ST segment changes.  ECG on 06/23/13 (independently read by me) normal sinus rhythm at 63 beats per minute.  PR interval 150 ms, QTc interval 433 ms  Prior 06/15/2013 ECG (independently read by me): Normal sinus rhythm at 72 beats per minute. PR interval 144 ms; QTc interval 451 ms  BMET  BMP Latest Ref Rng 05/05/2014 10/25/2013 07/14/2013  Glucose 70 - 99 mg/dL 126(H) 119(H) 125(H)  BUN 6 - 23 mg/dL 25(H) 24(H) 17  Creatinine 0.50 - 1.35 mg/dL 0.94 0.99 0.93  Sodium 135 - 145 mEq/L 141 137 140  Potassium 3.5 - 5.3 mEq/L 4.5 4.6 4.6  Chloride 96 - 112 mEq/L 106 104 104  CO2 19 - 32 mEq/L 25 26 28   Calcium 8.4 - 10.5 mg/dL 9.5 9.5 9.5    Hepatic Function Panel   Hepatic Function Latest Ref Rng 05/05/2014 10/25/2013 07/14/2013  Total Protein 6.0 - 8.3 g/dL 6.8 6.9 6.7  Albumin 3.5 - 5.2 g/dL 4.5 4.4 4.3  AST 0 - 37 U/L 21 22 27   ALT 0 - 53 U/L 24 24 44  Alk Phosphatase 39 - 117 U/L 34(L) 34(L) 41  Total Bilirubin 0.2 - 1.2 mg/dL 0.5 0.4 0.6     CBC  CBC Latest Ref Rng 05/05/2014 10/25/2013 06/15/2013  WBC 4.0 - 10.5 K/uL 8.0 6.9 9.5  Hemoglobin 13.0 - 17.0 g/dL 14.2 13.6 14.8  Hematocrit 39.0 - 52.0 % 42.5 40.4 43.9  Platelets 150 - 400 K/uL 267 279 344     BNP No results found for: PROBNP  Lipid Panel     Component Value Date/Time   CHOL 171 05/05/2014 0803   TRIG 90 05/05/2014 0803   TRIG 96 10/25/2013 0804   HDL 57 05/05/2014 0803   CHOLHDL 3.0 05/05/2014 0803   VLDL 18 05/05/2014 0803   LDLCALC 96 05/05/2014 0803   LDLCALC 78 10/25/2013 0804    NMR Profile 10/25/2013   Ref Range 65moago  162mogo      LDL Particle Number <1000 nmol/L 799    Comments: Reference Range  Low < 1000  Moderate 1000 - 1299  Borderline-High 1300  - 1599  High 1600 - 2000  Very High > 2000     LDL (calc) <100 mg/dL 78 72R, CM   Comments: LDL-C is inaccurate if patient is nonfasting. Reference Range  Optimal < 100  Near/Above Optimal 100 - 129  Borderline High 130 - 159  High 160 - 189  Very High >= 190     HDL-C >=40 mg/dL 55     Triglycerides <150 mg/dL 96 398 (H)    Cholesterol, Total <200 mg/dL 152     HDL Particle Number >=30.5 umol/L 37.9     Large HDL-P >=4.8 umol/L 7.6     Large VLDL-P <=2.7 nmol/L 2.5     Small LDL Particle Number <=527 nmol/L 275     LDL Size >20.5 nm 20.9     HDL Size >=9.2 nm 9.0 (L)     VLDL Size <=46.6 nm 41.4     LP-IR Score <=45  37       RADIOLOGY: No results found.   ASSESSMENT AND PLAN:  Mr. Victor Loezas a [age]ear old gentleman who had documented normal coronary arteries and normal LV function at cardiac catheterization in 1999 after a nuclear perfusion study raised the possibility of subtle ischemia.  Last year, he had developed overt diabetes, and also had episodes of paroxysmal atrial fibrillation.  With significant lifestyle adjustment he lost approximately 25.  His hemoglobin A1c is markedly improved and he now is in the metabolic syndrome category.  His blood pressure today is stable at 115/70 when rechecked by me.  He has been on Toprol 50 mg daily.  I reviewed his CardioNet monitor in detail.  He has been in sinus rhythm the entire time with only rare episodes of PACs and isolated unifocal PVCs.  His lipid studies are excellent on his current dose of pravastatin 40 mg and fenofibrate.  He is on Eliquis for anticoagulation and denies bleeding.  He is walking 3-3-1/2 miles per day.  His body mass index is 28.94 kg/m.  Continued exercise and weight loss was discussed.  As long as he remained stable, I will see him in 6 months for reevaluation or sooner if problems arise.    Troy Sine, MD, [facility] 05/10/2014 9:12 AM

## 2014-06-05 ENCOUNTER — Other Ambulatory Visit: Payer: Self-pay | Admitting: Orthopaedic Surgery

## 2014-06-05 DIAGNOSIS — M545 Low back pain: Secondary | ICD-10-CM

## 2014-06-14 ENCOUNTER — Ambulatory Visit
Admission: RE | Admit: 2014-06-14 | Discharge: 2014-06-14 | Disposition: A | Discharge status: Home / Self Care | Payer: BLUE CROSS/BLUE SHIELD | Source: Ambulatory Visit | Attending: Orthopaedic Surgery | Admitting: Orthopaedic Surgery

## 2014-06-14 DIAGNOSIS — M545 Low back pain: Secondary | ICD-10-CM

## 2014-06-20 ENCOUNTER — Other Ambulatory Visit: Payer: Self-pay | Admitting: Dermatology

## 2014-07-19 ENCOUNTER — Other Ambulatory Visit: Payer: Self-pay | Admitting: Cardiovascular Disease

## 2014-09-26 ENCOUNTER — Other Ambulatory Visit: Payer: Self-pay | Admitting: Orthopaedic Surgery

## 2014-09-26 DIAGNOSIS — M25562 Pain in left knee: Secondary | ICD-10-CM

## 2014-10-04 ENCOUNTER — Ambulatory Visit
Admission: RE | Admit: 2014-10-04 | Discharge: 2014-10-04 | Disposition: A | Discharge status: Home / Self Care | Payer: BLUE CROSS/BLUE SHIELD | Source: Ambulatory Visit | Attending: Orthopaedic Surgery | Admitting: Orthopaedic Surgery

## 2014-10-04 DIAGNOSIS — M25562 Pain in left knee: Secondary | ICD-10-CM

## 2014-11-19 ENCOUNTER — Other Ambulatory Visit: Payer: Self-pay | Admitting: Cardiovascular Disease

## 2014-11-21 ENCOUNTER — Encounter: Payer: Self-pay | Admitting: Cardiovascular Disease

## 2014-11-24 ENCOUNTER — Telehealth: Payer: Self-pay | Admitting: Cardiovascular Disease

## 2014-11-24 DIAGNOSIS — I480 Paroxysmal atrial fibrillation: Secondary | ICD-10-CM

## 2014-11-24 DIAGNOSIS — E785 Hyperlipidemia, unspecified: Secondary | ICD-10-CM

## 2014-11-24 DIAGNOSIS — Z79899 Other long term (current) drug therapy: Secondary | ICD-10-CM

## 2014-11-24 NOTE — Telephone Encounter (Signed)
Pt wants to know if he he needs lab work before his appt on 12-01-14 please?

## 2014-11-24 NOTE — Telephone Encounter (Signed)
6 mo/ lab tests ordered to [facility], patient informed. He verbalized understanding, including instructions to fast for lipid test.

## 2014-11-29 LAB — COMPREHENSIVE METABOLIC PANEL
ALBUMIN: 4.3 g/dL (ref 3.6–5.1)
ALK PHOS: 37 U/L — AB (ref 40–115)
ALT: 21 U/L (ref 9–46)
AST: 19 U/L (ref 10–35)
BILIRUBIN TOTAL: 0.4 mg/dL (ref 0.2–1.2)
BUN: 22 mg/dL (ref 7–25)
CALCIUM: 9.3 mg/dL (ref 8.6–10.3)
CO2: 29 mmol/L (ref 20–31)
Chloride: 105 mmol/L (ref 98–110)
Creat: 0.91 mg/dL (ref 0.70–1.18)
GLUCOSE: 130 mg/dL — AB (ref 65–99)
POTASSIUM: 4.9 mmol/L (ref 3.5–5.3)
Sodium: 139 mmol/L (ref 135–146)
TOTAL PROTEIN: 6.8 g/dL (ref 6.1–8.1)

## 2014-11-29 LAB — LIPID PANEL
CHOL/HDL RATIO: 3.1 ratio (ref <=5.0)
CHOLESTEROL: 160 mg/dL (ref 125–200)
HDL: 52 mg/dL (ref >=40)
LDL Cholesterol: 88 mg/dL (ref <130)
TRIGLYCERIDES: 102 mg/dL (ref <150)
VLDL: 20 mg/dL (ref <30)

## 2014-11-29 LAB — CBC
HEMATOCRIT: 42.5 % (ref 39.0–52.0)
HEMOGLOBIN: 14.1 g/dL (ref 13.0–17.0)
MCH: 29.7 pg (ref 26.0–34.0)
MCHC: 33.2 g/dL (ref 30.0–36.0)
MCV: 89.5 fL (ref 78.0–100.0)
MPV: 10.8 fL (ref 8.6–12.4)
PLATELETS: 280 10*3/uL (ref 150–400)
RBC: 4.75 MIL/uL (ref 4.22–5.81)
RDW: 13.8 % (ref 11.5–15.5)
WBC: 7.3 10*3/uL (ref 4.0–10.5)

## 2014-11-29 LAB — MAGNESIUM: Magnesium: 1.9 mg/dL (ref 1.5–2.5)

## 2014-11-29 LAB — TSH: TSH: 4.091 u[IU]/mL (ref 0.350–4.500)

## 2014-12-01 ENCOUNTER — Ambulatory Visit (INDEPENDENT_AMBULATORY_CARE_PROVIDER_SITE_OTHER): Payer: BLUE CROSS/BLUE SHIELD | Admitting: Cardiovascular Disease

## 2014-12-01 VITALS — BP 112/84 | HR 66 | Ht 73.0 in | Wt 221.6 lb

## 2014-12-01 DIAGNOSIS — I4891 Unspecified atrial fibrillation: Secondary | ICD-10-CM

## 2014-12-01 DIAGNOSIS — G4733 Obstructive sleep apnea (adult) (pediatric): Secondary | ICD-10-CM | POA: Diagnosis not present

## 2014-12-01 DIAGNOSIS — E8881 Metabolic syndrome: Secondary | ICD-10-CM | POA: Diagnosis not present

## 2014-12-01 DIAGNOSIS — E785 Hyperlipidemia, unspecified: Secondary | ICD-10-CM

## 2014-12-01 MED ORDER — METOPROLOL SUCCINATE ER 50 MG PO TB24: 50.0000 mg | ORAL_TABLET | Freq: Every day | ORAL | Status: DC

## 2014-12-01 MED ORDER — FENOFIBRATE 145 MG PO TABS: 145.0000 mg | ORAL_TABLET | Freq: Every day | ORAL | Status: DC

## 2014-12-01 MED ORDER — APIXABAN 5 MG PO TABS: 5.0000 mg | ORAL_TABLET | Freq: Two times a day (BID) | ORAL | Status: DC

## 2014-12-01 NOTE — Patient Instructions (Signed)
Your physician wants you to follow-up in: 6 months or sooner if needed. You will receive a reminder letter in the mail two months in advance. If you don't receive a letter, please call our office to schedule the follow-up appointment. 

## 2014-12-03 ENCOUNTER — Encounter: Payer: Self-pay | Admitting: Cardiovascular Disease

## 2014-12-03 NOTE — Progress Notes (Signed)
Patient ID: Victor Sanchez, male   DOB: 02/04/1941, 74 y.o.   MRN: [id number]   HPI: [name] is a 74 year old white male who presents for a six-month follow-up cardiology evaluation.  [name] developed  atypical chest pain in 1999. A nuclear perfusion study raised the possibility of focal inferior scar with possible inferolateral redistribution. Cardiac catheterization revealed normal LV function and normal coronary arteries. In 2008 a nuclear study showed diaphragmatic attenuation although the ormal interpretation was interpreted as possible ischemia. In January 2015 Victor Sanchez developed URI symptoms and had  significant difficulty with sleep. Victor Sanchez also had significant deviated septum, and has had episodes with paroxysmal positional vertigo. As part of his preoperative evaluation for ENT surgery Victor Sanchez was found to have possible abnormal heart rhythms. Victor Sanchez underwent his ENT surgery for with a nasal septoplasty, reduction of his inferior turbinates, and LAUP. When Dr. Erik Sanchez saw him  Victor Sanchez was concerned about the possibility of atrial fibrillation.  Victor Sanchez reestablished care with me last year following this episode.  At that time prior to a planned trip to [place] for 2 weeks I recommended that Victor Sanchez undergo laboratory as well as wear a CardioNet monitor.  Blood work was abnormal in that his total cholesterol was 203, triglycerides 398, HDL 51, VLDL 80 LDL 72.  Magnesium was normal.  His fasting glucose was 165 and the hemoglobin A1c was elevated at 8.0 indicative of a mean plasma glucose of approximately 183.  Victor Sanchez had normal hemoglobin and hematocrit.  A 2-D echo Doppler study revealed an ejection fraction in the 55-60%.  On his monitor, Victor Sanchez developed recurrent episodes of paroxysmal atrial fibrillation with rates in the low 100s, but in one instance  Victor Sanchez developed a 10 beat run of PSVT approximately 178 beats per minute.  Victor Sanchez was contacted regarding these results and was brought into the office on 06/23/2013.  An echo Doppler   revealed mild aortic valve sclerosis, mild tricuspid regurgitation, and mild MR, with normal systolic function.  His laboratory was suggestive of type 2 diabetes mellitus.  With his episodes of PAF noted on his monitor.  I recommended institution of low-dose Toprol initially at 25 mg and titrate this to 50 mg.  I also extended his cardiac monitor beyond the initial 10 days.  I recommended the addition of fenofibrate 145 mg in light of his significant triglyceride elevation and also suggested initiation of metformin 500 mg daily with plans for primary care followup.  The subsequent CardioNet monitor did not demonstrate any further episodes of PAF on his increased beta blocker therapy although Victor Sanchez did have some sinus tachycardia during activity.  Repeat blood work was significantly improved with his fasting glucose improved from 165, and remotely, 202.  Liver function studies were normal.  Renal function was normal.  In August 2015 Victor Sanchez had continued to be very dedicated both with exercise, as well as improved diet.  Victor Sanchez was walking 3-4 miles per day and lost approximately 25 pounds.  Victor Sanchez was unaware of any arrhythmia.  Victor Sanchez denies chest pain or shortness of breath.  Followup blood work revealed marked improvement in his hemoglobin A1c from 8.0-6.1.  His lipid studies were markedly improved with triglycerides being reduced to 96 from 398.  An NMR lipoprotein showed an LDL particle number was excellent at 799, with calculated LDL 78.  HDL particle number was 37.9.  Victor Sanchez was no longer in resistant with an insulin resistance score 37.    When I saw him earlier this year  in February 2016 Victor Sanchez complained that his chest would quiver on the left and Victor Sanchez would notice some occasional pounding from within.  Victor Sanchez was uncertain if Victor Sanchez was having potential breakthrough atrial fibrillation or for these were just muscle spasms.  They have been occurring daily.  The episodes would last 5-6 seconds and then resolve.  Victor Sanchez denies associated  presyncope or syncope.  Victor Sanchez denies any nocturnal symptoms.  At that time, I felt most likely this symptomatology was due to pectoralis muscle fasciculations rather than recurrent AF.  Victor Sanchez wore a 2 week event monitor from 04/19/2014 through 05/02/2014.  This did not demonstrate any significant arrhythmia, but showed predominantly sinus rhythm with sinus bradycardia and rare PACs and rare isolated PVCs.  Assessment laboratory was also done which I reviewed with him in detail today.  His hemoglobin and hematocrit were stable.  Fasting glucose was mildly increased at 126.  Normal renal function and electrolytes.  TSH was borderline increased at 4.7.  His studies were normal.  Lipid studies continue to be excellent with a total cholesterol of 171, triglycerides 90, HDL 57, and LDL 96, on his current therapy.    Over the past 7 months, Victor Sanchez has continued to do well.  Victor Sanchez is exercising regularly.  Victor Sanchez tore his meniscus in his left knee which has somewhat curtailed his level of exercise.  Is unaware of any arrhythmia.  Victor Sanchez had recent blood work.  I reviewed this with him in detail today.  His magnesium was normal.  Victor Sanchez was not anemic.  TSH was 4.09.  Fasting glucose was mildly increased.  Victor Sanchez had normal renal function.  His lipid studies.  Continued to improve with a total cholesterol 160, triglycerides 102, HDL 52, LDL 88.  In October, Victor Sanchez will be going to [place] and [place] for approximately 3 weeks for vacation.  Victor Sanchez presents for evaluation  Past Medical History  Diagnosis Date  . Enlarged prostate   . History of asthma     as a child  . Headache(784.0)     occ.  . Sleep apnea     2013  . Diabetes mellitus without complication   . Hyperlipidemia   . Hypertension     Past Surgical History  Procedure Laterality Date  . Cardiac catheterization  09/06/1997    normal LV function, normal coronaries (Dr. Shelva Majestic)  . Ventral hernia repair      x 2  . Tonsillectomy      as a child  . Appendectomy  1968    . Knee arthroscopy  1990'    with baker's cyst excision unsure side  . Nasopharyngoscopy  1972    cyst removal  . Nasal septoplasty w/ turbinoplasty Bilateral 05/25/2013    Procedure: NASAL SEPTOPLASTY WITH TURBINATE REDUCTION;  Surgeon: Jodi Marble, MD;  Location: [facility];  Service: ENT;  Laterality: Bilateral;  . Uvulectomy N/A 05/25/2013    Procedure: Myrtis Ser ASSISTED;  Surgeon: Jodi Marble, MD;  Location: [facility] OR;  Service: ENT;  Laterality: N/A;  . Transthoracic echocardiogram  03/2006    EF=>55%, mild conc LVH; borderline RV/RA enlargement; borderline MVP, prolapse of anterior leaflets, mild MR; mild TR; AV mildly sclerotic  . Nm myocar perf wall motion  03/2006    bruce myoview - mild-mod perfusion defect in basal inferoseptal walls with mild reversibility at rest; mild to mod defect in mid inferior wall with mild reversibility at rest; EF 63%; intermediate risk scan    No Known Allergies  Current Outpatient Prescriptions  Medication Sig Dispense Refill  . apixaban (ELIQUIS) 5 MG TABS tablet Take 1 tablet (5 mg total) by mouth 2 (two) times daily. 180 tablet 3  . fenofibrate (TRICOR) 145 MG tablet Take 1 tablet (145 mg total) by mouth daily. 90 tablet 3  . finasteride (PROSCAR) 5 MG tablet Take 5 mg by mouth daily.    . metoprolol succinate (TOPROL-XL) 50 MG 24 hr tablet Take 1 tablet (50 mg total) by mouth daily. Take with or immediately following a meal. 90 tablet 3  . Multiple Vitamins-Minerals (MULTIVITAMIN WITH MINERALS) tablet Take 1 tablet by mouth daily.    . pravastatin (PRAVACHOL) 40 MG tablet Take 40 mg by mouth daily.    . tamsulosin (FLOMAX) 0.4 MG CAPS capsule Take 0.4 mg by mouth daily.     No current facility-administered medications for this visit.    Socially Victor Sanchez is married. Victor Sanchez has 3 children 4 grandchildren. There is no tobacco use. Victor Sanchez did smoke for short-term and quit in 1969. Victor Sanchez does drink alcohol.  Family History  Problem Relation Age of Onset  .  Kidney failure Mother   . Heart disease Other   . Emphysema Father   . Breast cancer Sister   . Multiple myeloma Sister      ROS General: Negative; No fevers, chills, or night sweats; positive for purposeful 6 pound weight loss HEENT: Negative; No changes in vision or hearing, sinus congestion, difficulty swallowing Pulmonary: Negative; No cough, wheezing, shortness of breath, hemoptysis Cardiovascular: See history of present illness GI: Negative; No nausea, vomiting, diarrhea, or abdominal pain GU: Negative; No dysuria, hematuria, or difficulty voiding Musculoskeletal: Negative; no myalgias, joint pain, or weakness Hematologic/Oncology: Negative; no easy bruising, bleeding Endocrine: Negative; no heat/cold intolerance; Victor Sanchez is unaware of polydipsia or polyuria Neuro: Negative; no changes in balance, headaches Skin: Negative; No rashes or skin lesions Psychiatric: Negative; No behavioral problems, depression Sleep: Positive for sleep apnea, currently not on CPAP, but status post recent ENT surgery; No snoring, daytime sleepiness, hypersomnolence, bruxism, restless legs, hypnogognic hallucinations, no cataplexy Other comprehensive 14 point system review is negative.   PE BP 112/84 mmHg  Pulse 66  Ht $R'6\' 1"'iq$  (1.854 m)  Wt 221 lb 9.6 oz (100.517 kg)  BMI 29.24 kg/m2   Wt Readings from Last 3 Encounters:  12/01/14 221 lb 9.6 oz (100.517 kg)  10/04/14 222 lb (100.699 kg)  05/10/14 219 lb 4.8 oz (99.474 kg)   General: Alert, oriented, no distress.  Skin: normal turgor, no rashes HEENT: Normocephalic, atraumatic. Pupils round and reactive; sclera anicteric; extraocular muscles intact; Fundi  without hemorrhages or exudate  Nose without nasal septal hypertrophy; status post recent surgery for deviated septum and reduction of inferior turbinates Mouth/Parynx benign; Victor Sanchez is status post recent laser surgery to the soft palate Neck: No JVD, no carotid bruits; normal carotid upstroke Lungs:  clear to ausculatation and percussion; no wheezing or rales Chest wall: without tenderness to palpitation Heart: PMI is not displaced RRR, s1 s2 normal I do not hear any arrhythmias on prolonged auscultation. Faint 1/6 systolic murmur. No S3 gallop; no rubs thrills or heaves. Abdomen: Mild central adiposity. soft, nontender; no hepatosplenomehaly, BS+; abdominal aorta nontender and not dilated by palpation. Back: no CVA tenderness Pulses 2+ Extremities: no clubbing cyanosis or edema, Homan's sign negative  Neurologic: grossly nonfocal; Cranial nerves grossly wnl Psychologic: Normal mood and affect  ECG (independently read by me): Sinus rhythm with PAC's, atrial bigeminy for which Victor Sanchez  is entirely asymptomatic.  05/10/2014 ECG (independently read by me): Sinus rhythm with sinus arrhythmia and  PACs with an atrial bigeminal rhythm  04/19/2014 ECG (independently read by me): Normal sinus rhythm at 61 bpm.  One isolated PVC.  No significant ST segment changes.  Normal intervals.  Prior ECG (independently read by me) normal sinus rhythm at 64 beats per minute.  Normal intervals.  No ST segment changes.  ECG on 06/23/13 (independently read by me) normal sinus rhythm at 63 beats per minute.  PR interval 150 ms, QTc interval 433 ms  Prior 06/15/2013 ECG (independently read by me): Normal sinus rhythm at 72 beats per minute. PR interval 144 ms; QTc interval 451 ms  LABS:  BMP Latest Ref Rng 11/28/2014 05/05/2014 10/25/2013  Glucose 65 - 99 mg/dL 130(H) 126(H) 119(H)  BUN 7 - 25 mg/dL 22 25(H) 24(H)  Creatinine 0.70 - 1.18 mg/dL 0.91 0.94 0.99  Sodium 135 - 146 mmol/L 139 141 137  Potassium 3.5 - 5.3 mmol/L 4.9 4.5 4.6  Chloride 98 - 110 mmol/L 105 106 104  CO2 20 - 31 mmol/L 29 25 26   Calcium 8.6 - 10.3 mg/dL 9.3 9.5 9.5    Hepatic Function Latest Ref Rng 11/28/2014 05/05/2014 10/25/2013  Total Protein 6.1 - 8.1 g/dL 6.8 6.8 6.9  Albumin 3.6 - 5.1 g/dL 4.3 4.5 4.4  AST 10 - 35 U/L 19 21 22   ALT 9  - 46 U/L 21 24 24   Alk Phosphatase 40 - 115 U/L 37(L) 34(L) 34(L)  Total Bilirubin 0.2 - 1.2 mg/dL 0.4 0.5 0.4    CBC Latest Ref Rng 11/28/2014 05/05/2014 10/25/2013  WBC 4.0 - 10.5 K/uL 7.3 8.0 6.9  Hemoglobin 13.0 - 17.0 g/dL 14.1 14.2 13.6  Hematocrit 39.0 - 52.0 % 42.5 42.5 40.4  Platelets 150 - 400 K/uL 280 267 279   Lab Results  Component Value Date   MCV 89.5 11/28/2014   MCV 89.9 05/05/2014   MCV 88.0 10/25/2013   Lab Results  Component Value Date   TSH 4.091 11/28/2014   Lab Results  Component Value Date   HGBA1C 6.1* 10/25/2013   No results found for: PROBNP  Lipid Panel     Component Value Date/Time   CHOL 160 11/28/2014 0912   CHOL 152 10/25/2013 0804   TRIG 102 11/28/2014 0912   TRIG 96 10/25/2013 0804   HDL 52 11/28/2014 0912   HDL 55 10/25/2013 0804   CHOLHDL 3.1 11/28/2014 0912   VLDL 20 11/28/2014 0912   LDLCALC 88 11/28/2014 0912   LDLCALC 78 10/25/2013 0804    NMR Profile 10/25/2013   Ref Range 61moago  171mogo      LDL Particle Number <1000 nmol/L 799    Comments: Reference Range  Low < 1000  Moderate 1000 - 1299  Borderline-High 1300 - 1599  High 1600 - 2000  Very High > 2000     LDL (calc) <100 mg/dL 78 72R, CM   Comments: LDL-C is inaccurate if patient is nonfasting. Reference Range  Optimal < 100  Near/Above Optimal 100 - 129  Borderline High 130 - 159  High 160 - 189  Very High >= 190     HDL-C >=40 mg/dL 55     Triglycerides <150 mg/dL 96 398 (H)    Cholesterol, Total <200 mg/dL 152     HDL Particle Number >=30.5 umol/L 37.9     Large HDL-P >=4.8 umol/L 7.6     Large VLDL-P <=  2.7 nmol/L 2.5     Small LDL Particle Number <=527 nmol/L 275     LDL Size >20.5 nm 20.9     HDL Size >=9.2 nm 9.0 (L)     VLDL Size <=46.6 nm 41.4     LP-IR Score <=45  37       RADIOLOGY: No results found.   ASSESSMENT AND PLAN:  [name] is a 74 year old gentleman who had documented normal coronary arteries  and normal LV function at cardiac catheterization in1999 after a nuclear perfusion study raised the possibility of subtle ischemia.  Last year, Victor Sanchez had developed overt diabetes, and also had episodes of paroxysmal atrial fibrillation.  With significant lifestyle adjustment Victor Sanchez lost approximately 25.  His blood pressure is now stable, Victor Sanchez is unaware of any recurrent arrhythmias, and Victor Sanchez continues to be on eloquence for anticoagulation.  Victor Sanchez denies bleeding.  Victor Sanchez is on pravastatin and fenofibrate for hyperlipidemia.  His most recent laboratory as noted above were reviewed with him in detail.  Clinically Victor Sanchez is doing well.  Victor Sanchez has seen Dr. Durward Fortes for his torn meniscus.  Victor Sanchez will be going on his trip to [place].  Victor Sanchez is stable cardiovascularly.  I will see him in 6 months for cardiology reevaluation.   Troy Sine, MD, [facility] 12/03/2014 10:46 PM

## 2014-12-19 ENCOUNTER — Other Ambulatory Visit: Payer: Self-pay | Admitting: Cardiovascular Disease

## 2014-12-19 NOTE — Telephone Encounter (Signed)
Fenofibrate and metoprolol Refilled #90 tablet with 3 refills 12/01/2014

## 2015-01-18 ENCOUNTER — Telehealth: Payer: Self-pay | Admitting: Cardiovascular Disease

## 2015-01-18 NOTE — Telephone Encounter (Signed)
Pt seen most recently by Dr. Tresa EndoKelly on 9/16. Torn meniscus noted at that time. F/u recommended in 6 months.  Dr. Cleophas DunkerWhitfield requesting hold on Eliquis to begin today for knee surgery on 11/10.  Routing to Dr. Tresa EndoKelly for OK.

## 2015-01-18 NOTE — Telephone Encounter (Signed)
Pt called in stating that he will be having arthroscopic knee surgery on 11/10 and Dr. Cleophas DunkerWhitfield wanted him to hold his Eliquis starting today. Pt wanted to make sure that Dr. Tresa EndoKelly approved of this. Please f/u with him as soon as you can  Thanks

## 2015-01-19 NOTE — Telephone Encounter (Signed)
Victor Sanchez is calling again to see when he can stop his Eliquis . Please call

## 2015-01-19 NOTE — Telephone Encounter (Signed)
Discussed w/ Dr. Tresa EndoKelly - he advised OK to d/c Eliquis 2-3 days prior to surgery (advised specifically to hold 8th, 9th, and 10th of November and resume at surgeon's indication). Can send clearance note to surgeon if they need it.  Communicated instructions to patient, he voiced understanding and thanks for the phone call.

## 2015-07-02 ENCOUNTER — Ambulatory Visit: Payer: BLUE CROSS/BLUE SHIELD | Admitting: Cardiovascular Disease

## 2015-07-05 ENCOUNTER — Encounter: Payer: Self-pay | Admitting: Cardiovascular Disease

## 2015-07-05 ENCOUNTER — Ambulatory Visit (INDEPENDENT_AMBULATORY_CARE_PROVIDER_SITE_OTHER): Payer: BLUE CROSS/BLUE SHIELD | Admitting: Cardiovascular Disease

## 2015-07-05 VITALS — BP 126/80 | HR 70 | Ht 73.0 in | Wt 224.0 lb

## 2015-07-05 DIAGNOSIS — Z794 Long term (current) use of insulin: Secondary | ICD-10-CM

## 2015-07-05 DIAGNOSIS — E785 Hyperlipidemia, unspecified: Secondary | ICD-10-CM | POA: Diagnosis not present

## 2015-07-05 DIAGNOSIS — E8881 Metabolic syndrome: Secondary | ICD-10-CM

## 2015-07-05 DIAGNOSIS — I480 Paroxysmal atrial fibrillation: Secondary | ICD-10-CM

## 2015-07-05 DIAGNOSIS — E119 Type 2 diabetes mellitus without complications: Secondary | ICD-10-CM | POA: Diagnosis not present

## 2015-07-05 DIAGNOSIS — I10 Essential (primary) hypertension: Secondary | ICD-10-CM | POA: Diagnosis not present

## 2015-07-05 DIAGNOSIS — G4733 Obstructive sleep apnea (adult) (pediatric): Secondary | ICD-10-CM

## 2015-07-05 NOTE — Patient Instructions (Signed)
Your physician recommends that you return for lab work in: MAY.  Your physician recommends that you schedule a follow-up appointment in: December.

## 2015-07-06 ENCOUNTER — Encounter: Payer: Self-pay | Admitting: Cardiovascular Disease

## 2015-07-06 NOTE — Progress Notes (Signed)
Patient ID: MACKY GALIK, male   DOB: [birth year], 75 y.o.   MRN: [id number]    Primary MD: Dr. Josetta Huddle  HPI: Mr. Rylen Swindler is a 75 year old white male who presents for a 7 month follow-up cardiology evaluation.  Mr. Holtman developed  atypical chest pain in 1999. A nuclear perfusion study raised the possibility of focal inferior scar with possible inferolateral redistribution. Cardiac catheterization revealed normal LV function and normal coronary arteries. In 2008 a nuclear study showed diaphragmatic attenuation although the ormal interpretation was interpreted as possible ischemia. In January 2015 he developed URI symptoms and had  significant difficulty with sleep. He also had significant deviated septum, and has had episodes with paroxysmal positional vertigo. As part of his preoperative evaluation for ENT surgery he was found to have possible abnormal heart rhythms. He underwent his ENT surgery for with a nasal septoplasty, reduction of his inferior turbinates, and LAUP. When Dr. Erik Obey saw him  he was concerned about the possibility of atrial fibrillation.  He reestablished care with me last year following this episode.  At that time prior to a planned trip to [place] for 2 weeks I recommended that he undergo laboratory as well as wear a CardioNet monitor.  Blood work was abnormal in that his total cholesterol was 203, triglycerides 398, HDL 51, VLDL 80 LDL 72.  Magnesium was normal.  His fasting glucose was 165 and the hemoglobin A1c was elevated at 8.0 indicative of a mean plasma glucose of approximately 183.  He had normal hemoglobin and hematocrit.  A 2-D echo Doppler study revealed an ejection fraction in the 55-60%.  On his monitor, he developed recurrent episodes of paroxysmal atrial fibrillation with rates in the low 100s, but in one instance  he developed a 10 beat run of PSVT approximately 178 beats per minute.  He was contacted regarding these results and was brought into the office on  06/23/2013.  An echo Doppler  revealed mild aortic valve sclerosis, mild tricuspid regurgitation, and mild MR, with normal systolic function.  His laboratory was suggestive of type 2 diabetes mellitus.  With his episodes of PAF noted on his monitor.  I recommended institution of low-dose Toprol initially at 25 mg and titrate this to 50 mg.  I also extended his cardiac monitor beyond the initial 10 days.  I recommended the addition of fenofibrate 145 mg in light of his significant triglyceride elevation and also suggested initiation of metformin 500 mg daily with plans for primary care followup.  The subsequent CardioNet monitor did not demonstrate any further episodes of PAF on his increased beta blocker therapy although he did have some sinus tachycardia during activity.  Repeat blood work was significantly improved with his fasting glucose improved from 165, and remotely, 202.  Liver function studies were normal.  Renal function was normal.  In August 2015 he had continued to be very dedicated both with exercise, as well as improved diet.  He was walking 3-4 miles per day and lost approximately 25 pounds.  He was unaware of any arrhythmia.  He denies chest pain or shortness of breath.  Followup blood work revealed marked improvement in his hemoglobin A1c from 8.0-6.1.  His lipid studies were markedly improved with triglycerides being reduced to 96 from 398.  An NMR lipoprotein showed an LDL particle number was excellent at 799, with calculated LDL 78.  HDL particle number was 37.9.  He was no longer in resistant with an insulin resistance score 37.  When I saw him  in February 2016 he complained that his chest would quiver on the left and he would notice some occasional pounding from within.  He was uncertain if he was having potential breakthrough atrial fibrillation or for these were just muscle spasms.  They have been occurring daily.  The episodes would last 5-6 seconds and then resolve.  He denied  associated presyncope or syncope or nocturnal symptoms.  At that time, I felt most likely this symptomatology was due to pectoralis muscle fasciculations rather than recurrent AF.  He wore a 2 week event monitor from 04/19/2014 through 05/02/2014.  This did not demonstrate any significant arrhythmia, but showed predominantly sinus rhythm with sinus bradycardia and rare PACs and rare isolated PVCs.  Assessment laboratory was also done which I reviewed with him in detail today.  His hemoglobin and hematocrit were stable.  Fasting glucose was mildly increased at 126.  Normal renal function and electrolytes.  TSH was borderline increased at 4.7.  His studies were normal.  Lipid studies continue to be excellent with a total cholesterol of 171, triglycerides 90, HDL 57, and LDL 96, on his current therapy.    I last saw him in September 2016, he has continued to do well.  He went to [place] and nausea.  Value.  Later he underwent arthroscopic knee surgery which took him 3-4 months to recover.  As result, his exercise has been reduced.  He is unaware of any recurrent arrhythmia.  In September 2016 LDL cholesterol was 88.  Total cholesterol 160, triglycerides 102.  He presents for evaluation  Past Medical History  Diagnosis Date  . Enlarged prostate   . History of asthma     as a child  . Headache(784.0)     occ.  . Sleep apnea     2013  . Diabetes mellitus without complication ([place])   . Hyperlipidemia   . Hypertension     Past Surgical History  Procedure Laterality Date  . Cardiac catheterization  09/06/1997    normal LV function, normal coronaries (Dr. Shelva Majestic)  . Ventral hernia repair      x 2  . Tonsillectomy      as a child  . Appendectomy  1968  . Knee arthroscopy  1990'    with baker's cyst excision unsure side  . Nasopharyngoscopy  1972    cyst removal  . Nasal septoplasty w/ turbinoplasty Bilateral 05/25/2013    Procedure: NASAL SEPTOPLASTY WITH TURBINATE REDUCTION;  Surgeon:  Jodi Marble, MD;  Location: [place];  Service: ENT;  Laterality: Bilateral;  . Uvulectomy N/A 05/25/2013    Procedure: Myrtis Ser ASSISTED;  Surgeon: Jodi Marble, MD;  Location: [facility];  Service: ENT;  Laterality: N/A;  . Transthoracic echocardiogram  03/2006    EF=>55%, mild conc LVH; borderline RV/RA enlargement; borderline MVP, prolapse of anterior leaflets, mild MR; mild TR; AV mildly sclerotic  . Nm myocar perf wall motion  03/2006    bruce myoview - mild-mod perfusion defect in basal inferoseptal walls with mild reversibility at rest; mild to mod defect in mid inferior wall with mild reversibility at rest; EF 63%; intermediate risk scan    No Known Allergies  Current Outpatient Prescriptions  Medication Sig Dispense Refill  . apixaban (ELIQUIS) 5 MG TABS tablet Take 1 tablet (5 mg total) by mouth 2 (two) times daily. 180 tablet 3  . fenofibrate (TRICOR) 145 MG tablet Take 1 tablet (145 mg total) by mouth daily. [place]  tablet 3  . finasteride (PROSCAR) 5 MG tablet Take 5 mg by mouth daily.    . metoprolol succinate (TOPROL-XL) 50 MG 24 hr tablet Take 1 tablet (50 mg total) by mouth daily. Take with or immediately following a meal. 90 tablet 3  . Multiple Vitamins-Minerals (MULTIVITAMIN WITH MINERALS) tablet Take 1 tablet by mouth daily.    . pravastatin (PRAVACHOL) 40 MG tablet Take 40 mg by mouth daily.    . tamsulosin (FLOMAX) 0.4 MG CAPS capsule Take 0.4 mg by mouth daily.     No current facility-administered medications for this visit.    Socially he is married. He has 3 children 4 grandchildren. There is no tobacco use. He did smoke for short-term and quit in 1969. He does drink alcohol.  Family History  Problem Relation Age of Onset  . Kidney failure Mother   . Heart disease Other   . Emphysema Father   . Breast cancer Sister   . Multiple myeloma Sister      ROS General: Negative; No fevers, chills, or night sweats; positive for purposeful 6 pound weight loss HEENT:  Negative; No changes in vision or hearing, sinus congestion, difficulty swallowing Pulmonary: Negative; No cough, wheezing, shortness of breath, hemoptysis Cardiovascular: See history of present illness GI: Negative; No nausea, vomiting, diarrhea, or abdominal pain GU: Negative; No dysuria, hematuria, or difficulty voiding Musculoskeletal: Negative; no myalgias, joint pain, or weakness Hematologic/Oncology: Negative; no easy bruising, bleeding Endocrine: Negative; no heat/cold intolerance; he is unaware of polydipsia or polyuria Neuro: Negative; no changes in balance, headaches Skin: Negative; No rashes or skin lesions Psychiatric: Negative; No behavioral problems, depression Sleep: Positive for sleep apnea, currently not on CPAP, but status post recent ENT surgery; No snoring, daytime sleepiness, hypersomnolence, bruxism, restless legs, hypnogognic hallucinations, no cataplexy Other comprehensive 14 point system review is negative.   PE BP 126/80 mmHg  Pulse 70  Ht 6' 1"  (1.854 m)  Wt 224 lb (101.606 kg)  BMI 29.56 kg/m2   Wt Readings from Last 3 Encounters:  07/05/15 224 lb (101.606 kg)  12/01/14 221 lb 9.6 oz (100.517 kg)  10/04/14 222 lb (100.699 kg)   General: Alert, oriented, no distress.  Skin: normal turgor, no rashes HEENT: Normocephalic, atraumatic. Pupils round and reactive; sclera anicteric; extraocular muscles intact; Fundi  without hemorrhages or exudate  Nose without nasal septal hypertrophy; status post recent surgery for deviated septum and reduction of inferior turbinates Mouth/Parynx benign; he is status post recent laser surgery to the soft palate Neck: No JVD, no carotid bruits; normal carotid upstroke Lungs: clear to ausculatation and percussion; no wheezing or rales Chest wall: without tenderness to palpitation Heart: PMI is not displaced RRR, s1 s2 normal I do not hear any arrhythmias on prolonged auscultation. Faint 1/6 systolic murmur. No S3 gallop; no  rubs thrills or heaves. Abdomen: Mild central adiposity. soft, nontender; no hepatosplenomehaly, BS+; abdominal aorta nontender and not dilated by palpation. Back: no CVA tenderness Pulses 2+ Extremities: no clubbing cyanosis or edema, Homan's sign negative  Neurologic: grossly nonfocal; Cranial nerves grossly wnl Psychologic: Normal mood and affect  ECG (independently read by me): Normal sinus rhythm with PAC pattern of atrial bigeminy  September 2016 ECG (independently read by me): Sinus rhythm with PAC's, atrial bigeminy for which he is entirely asymptomatic.  05/10/2014 ECG (independently read by me): Sinus rhythm with sinus arrhythmia and  PACs with an atrial bigeminal rhythm  04/19/2014 ECG (independently read by me): Normal sinus rhythm at 61  bpm.  One isolated PVC.  No significant ST segment changes.  Normal intervals.  Prior ECG (independently read by me) normal sinus rhythm at 64 beats per minute.  Normal intervals.  No ST segment changes.  ECG on 06/23/13 (independently read by me) normal sinus rhythm at 63 beats per minute.  PR interval 150 ms, QTc interval 433 ms  Prior 06/15/2013 ECG (independently read by me): Normal sinus rhythm at 72 beats per minute. PR interval 144 ms; QTc interval 451 ms  LABS:  BMP Latest Ref Rng 11/28/2014 05/05/2014 10/25/2013  Glucose 65 - 99 mg/dL 130(H) 126(H) 119(H)  BUN 7 - 25 mg/dL 22 25(H) 24(H)  Creatinine 0.70 - 1.18 mg/dL 0.91 0.94 0.99  Sodium 135 - 146 mmol/L 139 141 137  Potassium 3.5 - 5.3 mmol/L 4.9 4.5 4.6  Chloride 98 - 110 mmol/L 105 106 104  CO2 20 - 31 mmol/L 29 25 26   Calcium 8.6 - 10.3 mg/dL 9.3 9.5 9.5    Hepatic Function Latest Ref Rng 11/28/2014 05/05/2014 10/25/2013  Total Protein 6.1 - 8.1 g/dL 6.8 6.8 6.9  Albumin 3.6 - 5.1 g/dL 4.3 4.5 4.4  AST 10 - 35 U/L 19 21 22   ALT 9 - 46 U/L 21 24 24   Alk Phosphatase 40 - 115 U/L 37(L) 34(L) 34(L)  Total Bilirubin 0.2 - 1.2 mg/dL 0.4 0.5 0.4    CBC Latest Ref Rng 11/28/2014  05/05/2014 10/25/2013  WBC 4.0 - 10.5 K/uL 7.3 8.0 6.9  Hemoglobin 13.0 - 17.0 g/dL 14.1 14.2 13.6  Hematocrit 39.0 - 52.0 % 42.5 42.5 40.4  Platelets 150 - 400 K/uL 280 267 279   Lab Results  Component Value Date   MCV 89.5 11/28/2014   MCV 89.9 05/05/2014   MCV 88.0 10/25/2013   Lab Results  Component Value Date   TSH 4.091 11/28/2014   Lab Results  Component Value Date   HGBA1C 6.1* 10/25/2013   No results found for: PROBNP  Lipid Panel     Component Value Date/Time   CHOL 160 11/28/2014 0912   CHOL 152 10/25/2013 0804   TRIG 102 11/28/2014 0912   TRIG 96 10/25/2013 0804   HDL 52 11/28/2014 0912   HDL 55 10/25/2013 0804   CHOLHDL 3.1 11/28/2014 0912   VLDL 20 11/28/2014 0912   LDLCALC 88 11/28/2014 0912   LDLCALC 78 10/25/2013 0804    NMR Profile 10/25/2013   Ref Range 56moago  1102mogo      LDL Particle Number <1000 nmol/L 799    Comments: Reference Range  Low < 1000  Moderate 1000 - 1299  Borderline-High 1300 - 1599  High 1600 - 2000  Very High > 2000     LDL (calc) <100 mg/dL 78 72R, CM   Comments: LDL-C is inaccurate if patient is nonfasting. Reference Range  Optimal < 100  Near/Above Optimal 100 - 129  Borderline High 130 - 159  High 160 - 189  Very High >= 190     HDL-C >=40 mg/dL 55     Triglycerides <150 mg/dL 96 398 (H)    Cholesterol, Total <200 mg/dL 152     HDL Particle Number >=30.5 umol/L 37.9     Large HDL-P >=4.8 umol/L 7.6     Large VLDL-P <=2.7 nmol/L 2.5     Small LDL Particle Number <=527 nmol/L 275     LDL Size >20.5 nm 20.9     HDL Size >=9.2 nm 9.0 (L)  VLDL Size <=46.6 nm 41.4     LP-IR Score <=45  37       RADIOLOGY: No results found.   ASSESSMENT AND PLAN: Mr. Ismeal Heider is a 75 year old gentleman who had documented normal coronary arteries and normal LV function at cardiac catheterization in1999 after a nuclear perfusion study raised the possibility of subtle ischemia. In 2015, he had  developed overt diabetes, and also had episodes of paroxysmal atrial fibrillation.  With significant lifestyle adjustment he lost approximately 25.  His blood pressure is normalized and his hemoglobin A1c significantly improved.  He is not aware of any recurrent episodes of PAF and continues to do well with Eliquis anticoagulation.  He denies bleeding.  He is on pravastatin and fenofibrate for hyperlipidemia.  He is now able to exercise more.  Following his knee surgery.  Repeat fasting laboratory will be obtained.  Adjustments will be made was medical therapy if needed.  I will see him in 8-9  months for reevaluation.  Troy Sine, MD, [facility] 07/06/2015 5:29 PM

## 2015-07-31 DIAGNOSIS — M1990 Unspecified osteoarthritis, unspecified site: Secondary | ICD-10-CM | POA: Diagnosis not present

## 2015-07-31 DIAGNOSIS — N401 Enlarged prostate with lower urinary tract symptoms: Secondary | ICD-10-CM | POA: Diagnosis not present

## 2015-07-31 DIAGNOSIS — I10 Essential (primary) hypertension: Secondary | ICD-10-CM | POA: Diagnosis not present

## 2015-07-31 DIAGNOSIS — E781 Pure hyperglyceridemia: Secondary | ICD-10-CM | POA: Diagnosis not present

## 2015-08-02 DIAGNOSIS — H2513 Age-related nuclear cataract, bilateral: Secondary | ICD-10-CM | POA: Diagnosis not present

## 2015-08-15 DIAGNOSIS — I4891 Unspecified atrial fibrillation: Secondary | ICD-10-CM | POA: Diagnosis not present

## 2015-08-15 DIAGNOSIS — E785 Hyperlipidemia, unspecified: Secondary | ICD-10-CM | POA: Diagnosis not present

## 2015-08-15 DIAGNOSIS — I10 Essential (primary) hypertension: Secondary | ICD-10-CM | POA: Diagnosis not present

## 2015-08-15 DIAGNOSIS — E119 Type 2 diabetes mellitus without complications: Secondary | ICD-10-CM | POA: Diagnosis not present

## 2015-08-15 LAB — COMPREHENSIVE METABOLIC PANEL
ALBUMIN: 4.3 g/dL (ref 3.6–5.1)
ALK PHOS: 40 U/L (ref 40–115)
ALT: 27 U/L (ref 9–46)
AST: 23 U/L (ref 10–35)
BUN: 21 mg/dL (ref 7–25)
CALCIUM: 9.4 mg/dL (ref 8.6–10.3)
CO2: 26 mmol/L (ref 20–31)
Chloride: 103 mmol/L (ref 98–110)
Creat: 1.01 mg/dL (ref 0.70–1.18)
Glucose, Bld: 149 mg/dL — ABNORMAL HIGH (ref 65–99)
POTASSIUM: 4.7 mmol/L (ref 3.5–5.3)
Sodium: 138 mmol/L (ref 135–146)
Total Bilirubin: 0.4 mg/dL (ref 0.2–1.2)
Total Protein: 6.7 g/dL (ref 6.1–8.1)

## 2015-08-15 LAB — LIPID PANEL
CHOL/HDL RATIO: 2.8 ratio (ref <=5.0)
CHOLESTEROL: 168 mg/dL (ref 125–200)
HDL: 59 mg/dL (ref >=40)
LDL Cholesterol: 86 mg/dL (ref <130)
TRIGLYCERIDES: 116 mg/dL (ref <150)
VLDL: 23 mg/dL (ref <30)

## 2015-08-15 LAB — CBC
HEMATOCRIT: 41.7 % (ref 38.5–50.0)
Hemoglobin: 14.2 g/dL (ref 13.2–17.1)
MCH: 30.5 pg (ref 27.0–33.0)
MCHC: 34.1 g/dL (ref 32.0–36.0)
MCV: 89.5 fL (ref 80.0–100.0)
MPV: 10.5 fL (ref 7.5–12.5)
Platelets: 259 10*3/uL (ref 140–400)
RBC: 4.66 MIL/uL (ref 4.20–5.80)
RDW: 13.8 % (ref 11.0–15.0)
WBC: 7.6 10*3/uL (ref 3.8–10.8)

## 2015-08-15 LAB — HEMOGLOBIN A1C
HEMOGLOBIN A1C: 7 % — AB (ref <5.7)
Mean Plasma Glucose: 154 mg/dL

## 2015-08-15 LAB — TSH: TSH: 5.64 mIU/L — ABNORMAL HIGH (ref 0.40–4.50)

## 2015-08-28 DIAGNOSIS — D225 Melanocytic nevi of trunk: Secondary | ICD-10-CM | POA: Diagnosis not present

## 2015-08-28 DIAGNOSIS — Z85828 Personal history of other malignant neoplasm of skin: Secondary | ICD-10-CM | POA: Diagnosis not present

## 2015-08-28 DIAGNOSIS — L570 Actinic keratosis: Secondary | ICD-10-CM | POA: Diagnosis not present

## 2015-08-28 DIAGNOSIS — L821 Other seborrheic keratosis: Secondary | ICD-10-CM | POA: Diagnosis not present

## 2015-08-28 DIAGNOSIS — D2261 Melanocytic nevi of right upper limb, including shoulder: Secondary | ICD-10-CM | POA: Diagnosis not present

## 2015-08-29 ENCOUNTER — Telehealth: Payer: Self-pay | Admitting: *Deleted

## 2015-08-29 NOTE — Telephone Encounter (Signed)
-----   Message from Lennette Biharihomas A Kelly, MD sent at 08/26/2015 10:55 AM EDT ----- HbA1c increased at 7.0; consider metformin 500 mg daily or refer back to primary MD;   TSH is minimally elevated  Recheck in 1 month with TSH, free T3 and free T4.

## 2015-08-29 NOTE — Telephone Encounter (Signed)
Left patient lab results and recommendations on patient's cellular voicemail. Labs sent to patient's PCP for review and recommendations.

## 2015-10-18 DIAGNOSIS — N451 Epididymitis: Secondary | ICD-10-CM | POA: Diagnosis not present

## 2015-12-16 ENCOUNTER — Other Ambulatory Visit: Payer: Self-pay | Admitting: Cardiovascular Disease

## 2015-12-18 NOTE — Telephone Encounter (Signed)
Rx request sent to pharmacy.  

## 2015-12-19 ENCOUNTER — Other Ambulatory Visit: Payer: Self-pay | Admitting: Cardiovascular Disease

## 2016-02-06 DIAGNOSIS — Z0000 Encounter for general adult medical examination without abnormal findings: Secondary | ICD-10-CM | POA: Diagnosis not present

## 2016-02-06 DIAGNOSIS — E119 Type 2 diabetes mellitus without complications: Secondary | ICD-10-CM | POA: Diagnosis not present

## 2016-02-13 DIAGNOSIS — E119 Type 2 diabetes mellitus without complications: Secondary | ICD-10-CM | POA: Diagnosis not present

## 2016-02-13 DIAGNOSIS — Z23 Encounter for immunization: Secondary | ICD-10-CM | POA: Diagnosis not present

## 2016-02-13 DIAGNOSIS — G4733 Obstructive sleep apnea (adult) (pediatric): Secondary | ICD-10-CM | POA: Diagnosis not present

## 2016-02-13 DIAGNOSIS — I10 Essential (primary) hypertension: Secondary | ICD-10-CM | POA: Diagnosis not present

## 2016-02-13 DIAGNOSIS — Z1389 Encounter for screening for other disorder: Secondary | ICD-10-CM | POA: Diagnosis not present

## 2016-02-13 DIAGNOSIS — E781 Pure hyperglyceridemia: Secondary | ICD-10-CM | POA: Diagnosis not present

## 2016-02-13 DIAGNOSIS — Z0000 Encounter for general adult medical examination without abnormal findings: Secondary | ICD-10-CM | POA: Diagnosis not present

## 2016-02-15 DIAGNOSIS — Z125 Encounter for screening for malignant neoplasm of prostate: Secondary | ICD-10-CM | POA: Diagnosis not present

## 2016-02-25 ENCOUNTER — Telehealth: Payer: Self-pay | Admitting: Cardiovascular Disease

## 2016-02-25 DIAGNOSIS — E7849 Other hyperlipidemia: Secondary | ICD-10-CM

## 2016-02-25 DIAGNOSIS — I480 Paroxysmal atrial fibrillation: Secondary | ICD-10-CM

## 2016-02-25 DIAGNOSIS — Z79899 Other long term (current) drug therapy: Secondary | ICD-10-CM

## 2016-02-25 DIAGNOSIS — I10 Essential (primary) hypertension: Secondary | ICD-10-CM

## 2016-02-25 DIAGNOSIS — E784 Other hyperlipidemia: Secondary | ICD-10-CM | POA: Diagnosis not present

## 2016-02-25 NOTE — Telephone Encounter (Signed)
[facility]  [facility]-16 with Kelly-pls call [phone]

## 2016-02-25 NOTE — Telephone Encounter (Signed)
Spoke to patient, [facility] fasting lab[facility]. Patient aware he may have this [facility] any [facility] prior to appt.

## 2016-02-26 ENCOUNTER — Encounter (INDEPENDENT_AMBULATORY_CARE_PROVIDER_SITE_OTHER): Payer: Self-pay | Admitting: Orthopaedic Surgery

## 2016-02-26 ENCOUNTER — Ambulatory Visit (INDEPENDENT_AMBULATORY_CARE_PROVIDER_SITE_OTHER): Payer: BLUE CROSS/BLUE SHIELD | Admitting: Orthopaedic Surgery

## 2016-02-26 ENCOUNTER — Ambulatory Visit (INDEPENDENT_AMBULATORY_CARE_PROVIDER_SITE_OTHER): Payer: Self-pay

## 2016-02-26 VITALS — BP 108/64 | HR 64 | Ht 73.0 in | Wt 220.0 lb

## 2016-02-26 DIAGNOSIS — M25562 Pain in left knee: Secondary | ICD-10-CM

## 2016-02-26 DIAGNOSIS — M25561 Pain in right knee: Secondary | ICD-10-CM | POA: Diagnosis not present

## 2016-02-26 DIAGNOSIS — G8929 Other chronic pain: Secondary | ICD-10-CM | POA: Diagnosis not present

## 2016-02-26 LAB — CBC
HCT: 42.9 % (ref 38.5–50.0)
Hemoglobin: 14.1 g/dL (ref 13.2–17.1)
MCH: 29.6 pg (ref 27.0–33.0)
MCHC: 32.9 g/dL (ref 32.0–36.0)
MCV: 90.1 fL (ref 80.0–100.0)
MPV: 10.7 fL (ref 7.5–12.5)
PLATELETS: 244 10*3/uL (ref 140–400)
RBC: 4.76 MIL/uL (ref 4.20–5.80)
RDW: 14.2 % (ref 11.0–15.0)
WBC: 6.6 10*3/uL (ref 3.8–10.8)

## 2016-02-26 LAB — TSH: TSH: 4.32 m[IU]/L (ref 0.40–4.50)

## 2016-02-26 MED ORDER — METHYLPREDNISOLONE ACETATE 40 MG/ML IJ SUSP
80.0000 mg | INTRAMUSCULAR | Status: AC | PRN
  Administered 2016-02-26: 80 mg

## 2016-02-26 MED ORDER — LIDOCAINE HCL 1 % IJ SOLN
5.0000 mL | INTRAMUSCULAR | Status: AC | PRN
  Administered 2016-02-26: 5 mL

## 2016-02-26 MED ORDER — BUPIVACAINE HCL 0.5 % IJ SOLN
3.0000 mL | INTRAMUSCULAR | Status: AC | PRN
  Administered 2016-02-26: 3 mL via INTRA_ARTICULAR

## 2016-02-26 NOTE — Progress Notes (Signed)
Office Visit Note   Patient: Victor Sanchez           Date of Birth: 06-07-1940           MRN: [id number] Visit Date: 02/26/2016              Requested by: Haywood Pao, MD [address] [place], [place] [address] PCP: Haywood Pao, MD   Assessment & Plan: Visit Diagnoses: Bilateral osteoarthritis both knees  Plan: Cortisone injection right knee today, follow-up 1 week for cortisone injection left knee  Follow-Up Instructions: No Follow-up on file.   Orders:  No orders of the defined types were placed in this encounter.  No orders of the defined types were placed in this encounter.     Procedures: Large Joint Inj Date/Time: 02/26/2016 2:27 PM Performed by: Garald Balding Authorized by: Garald Balding   Consent Given by:  Patient Timeout: prior to procedure the correct patient, procedure, and site was verified   Indications:  Pain and joint swelling Location:  Knee Site:  R knee Prep: patient was prepped and draped in usual sterile fashion   Needle Size:  25 G Needle Length:  1.5 inches Approach:  Anteromedial Ultrasound Guidance: No   Fluoroscopic Guidance: No   Arthrogram: No   Medications:  5 mL lidocaine 1 %; 80 mg methylPREDNISolone acetate 40 MG/ML; 3 mL bupivacaine 0.5 % Aspiration Attempted: No   Patient tolerance:  Patient tolerated the procedure well with no immediate complications     Clinical Data: No additional findings.   Subjective: Chief Complaint  Patient presents with  . Right Knee - Pain  . Left Knee - Pain    Pt complaining of BIL knee pain for months. Denies injury, stiff and sore pain in BIL knees   Victor Sanchez is just over years status post left knee arthroscopy with a tear the medial meniscus at the root and tricompartmental degenerative changes including areas of bone-on-bone in the medial compartment. He actually did fairly well up until several weeks ago when he had onset of pain in both knees with out injury or  trauma. Pain is reached the point where he is having optimize of his activities Review of Systems   Objective: Vital Signs: There were no vitals taken for this visit.  Physical Exam  Ortho Exam examination of the right knee revealed no effusion he had mild medial joint pain without deformity. He lacked a few degrees to full extension. There was some patellar crepitation. No instability with a varus or valgus stress. No pain with range of motion of his right hip. No calf pain. No lower extremity swelling. Neurovascular exam intact.  Left knee exam reveals very minimal effusion. There was positive patellar crepitation. No instability. No calf pain or popliteal fullness. There appeared to be increased varus with medial joint pain. Her vascular exam intact.  Specialty Comments:  No specialty comments available.  Imaging: No results found.   PMFS History: Patient Active Problem List   Diagnosis Date Noted  . Palpitations 05/10/2014  . Metabolic syndrome 62/37/6283  . Type 2 diabetes mellitus ([place]) 07/06/2013  . PAF (paroxysmal atrial fibrillation) ([place]) 07/06/2013  . Encounter for anticoagulation discussion and counseling 07/06/2013  . Cardiac arrhythmia 06/17/2013  . Hyperlipidemia 06/17/2013  . Mild obesity 06/17/2013  . Abnormal fasting glucose 06/17/2013  . Deviated nasal septum 05/25/2013  . OSA (obstructive sleep apnea) 05/25/2013  . Nasal septal deviation 05/25/2013   Past Medical History:  Diagnosis Date  . Diabetes mellitus without complication ([place])   . Enlarged prostate   . Headache(784.0)    occ.  Marland Kitchen History of asthma    as a child  . Hyperlipidemia   . Hypertension   . Sleep apnea    2013    Family History  Problem Relation Age of Onset  . Kidney failure Mother   . Heart disease Other   . Emphysema Father   . Breast cancer Sister   . Multiple myeloma Sister     Past Surgical History:  Procedure Laterality Date  . APPENDECTOMY  1968  . CARDIAC  CATHETERIZATION  09/06/1997   normal LV function, normal coronaries (Dr. Shelva Majestic)  . KNEE ARTHROSCOPY  1990'   with baker's cyst excision unsure side  . NASAL SEPTOPLASTY W/ TURBINOPLASTY Bilateral 05/25/2013   Procedure: NASAL SEPTOPLASTY WITH TURBINATE REDUCTION;  Surgeon: Jodi Marble, MD;  Location: [place];  Service: ENT;  Laterality: Bilateral;  . NASOPHARYNGOSCOPY  1972   cyst removal  . NM MYOCAR PERF WALL MOTION  03/2006   bruce myoview - mild-mod perfusion defect in basal inferoseptal walls with mild reversibility at rest; mild to mod defect in mid inferior wall with mild reversibility at rest; EF 63%; intermediate risk scan  . TONSILLECTOMY     as a child  . TRANSTHORACIC ECHOCARDIOGRAM  03/2006   EF=>55%, mild conc LVH; borderline RV/RA enlargement; borderline MVP, prolapse of anterior leaflets, mild MR; mild TR; AV mildly sclerotic  . UVULECTOMY N/A 05/25/2013   Procedure: Myrtis Ser ASSISTED;  Surgeon: Jodi Marble, MD;  Location: [place];  Service: ENT;  Laterality: N/A;  . VENTRAL HERNIA REPAIR     x 2   Social History   Occupational History  .  [employer]   Social History Main Topics  . Smoking status: Former Smoker    Packs/day: 1.00    Years: 6.00    Quit date: 03/18/1967  . Smokeless tobacco: Never Used  . Alcohol use 4.2 oz/week    7 Glasses of wine per week     Comment: glass of wine or cocktail daily  . Drug use: No  . Sexual activity: Not on file

## 2016-02-27 DIAGNOSIS — R972 Elevated prostate specific antigen [PSA]: Secondary | ICD-10-CM | POA: Diagnosis not present

## 2016-02-27 LAB — LIPID PANEL
Cholesterol: 178 mg/dL (ref <200)
HDL: 63 mg/dL (ref >40)
LDL CALC: 95 mg/dL (ref <100)
Total CHOL/HDL Ratio: 2.8 Ratio (ref <5.0)
Triglycerides: 102 mg/dL (ref <150)
VLDL: 20 mg/dL (ref <30)

## 2016-02-27 LAB — COMPREHENSIVE METABOLIC PANEL
ALBUMIN: 4.2 g/dL (ref 3.6–5.1)
ALT: 23 U/L (ref 9–46)
AST: 20 U/L (ref 10–35)
Alkaline Phosphatase: 32 U/L — ABNORMAL LOW (ref 40–115)
BILIRUBIN TOTAL: 0.5 mg/dL (ref 0.2–1.2)
BUN: 21 mg/dL (ref 7–25)
CALCIUM: 9.3 mg/dL (ref 8.6–10.3)
CHLORIDE: 105 mmol/L (ref 98–110)
CO2: 27 mmol/L (ref 20–31)
Creat: 0.94 mg/dL (ref 0.70–1.18)
Glucose, Bld: 154 mg/dL — ABNORMAL HIGH (ref 65–99)
Potassium: 4.7 mmol/L (ref 3.5–5.3)
Sodium: 139 mmol/L (ref 135–146)
Total Protein: 6.6 g/dL (ref 6.1–8.1)

## 2016-02-27 LAB — HEMOGLOBIN A1C
Hgb A1c MFr Bld: 6.5 % — ABNORMAL HIGH (ref <5.7)
Mean Plasma Glucose: 140 mg/dL

## 2016-02-29 ENCOUNTER — Other Ambulatory Visit: Payer: Self-pay | Admitting: Cardiovascular Disease

## 2016-03-03 ENCOUNTER — Encounter (INDEPENDENT_AMBULATORY_CARE_PROVIDER_SITE_OTHER): Payer: Self-pay | Admitting: Orthopaedic Surgery

## 2016-03-03 ENCOUNTER — Ambulatory Visit (INDEPENDENT_AMBULATORY_CARE_PROVIDER_SITE_OTHER): Payer: BLUE CROSS/BLUE SHIELD | Admitting: Cardiovascular Disease

## 2016-03-03 ENCOUNTER — Encounter: Payer: Self-pay | Admitting: Cardiovascular Disease

## 2016-03-03 ENCOUNTER — Ambulatory Visit (INDEPENDENT_AMBULATORY_CARE_PROVIDER_SITE_OTHER): Payer: BLUE CROSS/BLUE SHIELD | Admitting: Orthopaedic Surgery

## 2016-03-03 VITALS — Resp 14 | Ht 74.0 in | Wt 202.0 lb

## 2016-03-03 VITALS — BP 110/80 | HR 68 | Ht 73.0 in | Wt 218.1 lb

## 2016-03-03 DIAGNOSIS — E8881 Metabolic syndrome: Secondary | ICD-10-CM

## 2016-03-03 DIAGNOSIS — M25561 Pain in right knee: Secondary | ICD-10-CM | POA: Diagnosis not present

## 2016-03-03 DIAGNOSIS — G8929 Other chronic pain: Secondary | ICD-10-CM | POA: Diagnosis not present

## 2016-03-03 DIAGNOSIS — E782 Mixed hyperlipidemia: Secondary | ICD-10-CM | POA: Diagnosis not present

## 2016-03-03 DIAGNOSIS — I480 Paroxysmal atrial fibrillation: Secondary | ICD-10-CM

## 2016-03-03 DIAGNOSIS — Z79899 Other long term (current) drug therapy: Secondary | ICD-10-CM | POA: Diagnosis not present

## 2016-03-03 DIAGNOSIS — M25562 Pain in left knee: Secondary | ICD-10-CM

## 2016-03-03 MED ORDER — ROSUVASTATIN CALCIUM 10 MG PO TABS: 10.0000 mg | ORAL_TABLET | Freq: Every day | ORAL | 3 refills | Status: DC

## 2016-03-03 MED ORDER — LIDOCAINE HCL 1 % IJ SOLN
5.0000 mL | INTRAMUSCULAR | Status: AC | PRN
  Administered 2016-03-03: 5 mL

## 2016-03-03 MED ORDER — METHYLPREDNISOLONE ACETATE 40 MG/ML IJ SUSP
80.0000 mg | INTRAMUSCULAR | Status: AC | PRN
  Administered 2016-03-03: 80 mg

## 2016-03-03 MED ORDER — BUPIVACAINE HCL 0.5 % IJ SOLN
3.0000 mL | INTRAMUSCULAR | Status: AC | PRN
  Administered 2016-03-03: 3 mL via INTRA_ARTICULAR

## 2016-03-03 NOTE — Progress Notes (Signed)
Office Visit Note   Patient: Victor Sanchez           Date of Birth: Sep 20, 1940           MRN: [id number] Visit Date: 03/03/2016              Requested by: Haywood Pao, MD [address] [place], [place] [address] PCP: Haywood Pao, MD   Assessment & Plan: Visit Diagnoses: Bilateral knee osteoarthritis Plan: Cortisone injection left knee.   Follow-Up Instructions: No Follow-up on file.   Orders:  No orders of the defined types were placed in this encounter.  No orders of the defined types were placed in this encounter.     Procedures: Large Joint Inj Date/Time: 03/03/2016 10:26 AM Performed by: Garald Balding Authorized by: Garald Balding   Consent Given by:  Patient Timeout: prior to procedure the correct patient, procedure, and site was verified   Indications:  Pain and joint swelling Location:  Knee Site:  L knee Prep: patient was prepped and draped in usual sterile fashion   Needle Size:  25 G Needle Length:  1.5 inches Approach:  Anteromedial Ultrasound Guidance: No   Fluoroscopic Guidance: No   Arthrogram: No   Medications:  5 mL lidocaine 1 %; 80 mg methylPREDNISolone acetate 40 MG/ML; 3 mL bupivacaine 0.5 % Aspiration Attempted: No   Patient tolerance:  Patient tolerated the procedure well with no immediate complications     Clinical Data: No additional findings.   Subjective: No chief complaint on file.   Pt coming in today for left knee injection. Right knee inj last week, pain eased off  Victor Sanchez was seen last week for evaluation of bilateral knee pain consistent with osteoarthritis. I injected the right knee with an excellent result. He like to have the left knee injected today  Review of Systems   Objective: Vital Signs: There were no vitals taken for this visit.  Physical Exam  Ortho Exam right and left knee exam without any effusion. Full extension. No limp. No calf pain neurovascular exam intact distally. Mostly medial  joint pain bilaterally minimally now on the right after the injection  Specialty Comments:  No specialty comments available.  Imaging: No results found.   PMFS History: Patient Active Problem List   Diagnosis Date Noted  . Palpitations 05/10/2014  . Metabolic syndrome 16/03/930  . Type 2 diabetes mellitus ([place]) 07/06/2013  . PAF (paroxysmal atrial fibrillation) ([place]) 07/06/2013  . Encounter for anticoagulation discussion and counseling 07/06/2013  . Cardiac arrhythmia 06/17/2013  . Hyperlipidemia 06/17/2013  . Mild obesity 06/17/2013  . Abnormal fasting glucose 06/17/2013  . Deviated nasal septum 05/25/2013  . OSA (obstructive sleep apnea) 05/25/2013  . Nasal septal deviation 05/25/2013   Past Medical History:  Diagnosis Date  . Diabetes mellitus without complication ([place])   . Enlarged prostate   . Headache(784.0)    occ.  Marland Kitchen History of asthma    as a child  . Hyperlipidemia   . Hypertension   . Sleep apnea    2013    Family History  Problem Relation Age of Onset  . Kidney failure Mother   . Heart disease Other   . Emphysema Father   . Breast cancer Sister   . Multiple myeloma Sister     Past Surgical History:  Procedure Laterality Date  . APPENDECTOMY  1968  . CARDIAC CATHETERIZATION  09/06/1997   normal LV function, normal coronaries (Dr. Shelva Majestic)  .  KNEE ARTHROSCOPY  1990'   with baker's cyst excision unsure side  . NASAL SEPTOPLASTY W/ TURBINOPLASTY Bilateral 05/25/2013   Procedure: NASAL SEPTOPLASTY WITH TURBINATE REDUCTION;  Surgeon: Jodi Marble, MD;  Location: [facility];  Service: ENT;  Laterality: Bilateral;  . NASOPHARYNGOSCOPY  1972   cyst removal  . NM MYOCAR PERF WALL MOTION  03/2006   bruce myoview - mild-mod perfusion defect in basal inferoseptal walls with mild reversibility at rest; mild to mod defect in mid inferior wall with mild reversibility at rest; EF 63%; intermediate risk scan  . TONSILLECTOMY     as a child  . TRANSTHORACIC  ECHOCARDIOGRAM  03/2006   EF=>55%, mild conc LVH; borderline RV/RA enlargement; borderline MVP, prolapse of anterior leaflets, mild MR; mild TR; AV mildly sclerotic  . UVULECTOMY N/A 05/25/2013   Procedure: Myrtis Ser ASSISTED;  Surgeon: Jodi Marble, MD;  Location: [facility];  Service: ENT;  Laterality: N/A;  . VENTRAL HERNIA REPAIR     x 2   Social History   Occupational History  .  [employer]   Social History Main Topics  . Smoking status: Former Smoker    Packs/day: 1.00    Years: 6.00    Quit date: 03/18/1967  . Smokeless tobacco: Never Used  . Alcohol use 4.2 oz/week    7 Glasses of wine per week     Comment: glass of wine or cocktail daily  . Drug use: No  . Sexual activity: Not on file

## 2016-03-03 NOTE — Progress Notes (Signed)
Patient ID: Victor Sanchez, male   DOB: Jul 29, 1940, 75 y.o.   MRN: [id number]    Primary MD: Dr. Josetta Huddle  HPI: [name] is a 75 year old white male who presents for an 8 month follow-up cardiology evaluation.  Mr. [name] developed  atypical chest pain in 1999. A nuclear perfusion study raised the possibility of focal inferior scar with possible inferolateral redistribution. Cardiac catheterization revealed normal LV function and normal coronary arteries. In 2008 a nuclear study showed diaphragmatic attenuation although the ormal interpretation was interpreted as possible ischemia. In January 2015 he developed URI symptoms and had  significant difficulty with sleep. He also had significant deviated septum, and has had episodes with paroxysmal positional vertigo. As part of his preoperative evaluation for ENT surgery he was found to have possible abnormal heart rhythms. He underwent his ENT surgery for with a nasal septoplasty, reduction of his inferior turbinates, and LAUP. When Dr. Erik Obey saw him  he was concerned about the possibility of atrial fibrillation.  He reestablished care with me last year following this episode.  At that time prior to a planned trip to [place] for 2 weeks I recommended that he undergo laboratory as well as wear a CardioNet monitor.  Blood work was abnormal in that his total cholesterol was 203, triglycerides 398, HDL 51, VLDL 80 LDL 72.  Magnesium was normal.  His fasting glucose was 165 and the hemoglobin A1c was elevated at 8.0 indicative of a mean plasma glucose of approximately 183.  He had normal hemoglobin and hematocrit.  A 2-D echo Doppler study revealed an ejection fraction in the 55-60%.  On his monitor, he developed recurrent episodes of paroxysmal atrial fibrillation with rates in the low 100s, but in one instance  he developed a 10 beat run of PSVT approximately 178 beats per minute.  He was contacted regarding these results and was brought into the office on  06/23/2013.  An echo Doppler  revealed mild aortic valve sclerosis, mild tricuspid regurgitation, and mild MR, with normal systolic function.  His laboratory was suggestive of type 2 diabetes mellitus.  With his episodes of PAF noted on his monitor.  I recommended institution of low-dose Toprol initially at 25 mg and titrate this to 50 mg.  I also extended his cardiac monitor beyond the initial 10 days.  I recommended the addition of fenofibrate 145 mg in light of his significant triglyceride elevation and also suggested initiation of metformin 500 mg daily with plans for primary care followup.  The subsequent CardioNet monitor did not demonstrate any further episodes of PAF on his increased beta blocker therapy although he did have some sinus tachycardia during activity.  Repeat blood work was significantly improved with his fasting glucose improved from 165, and remotely, 202.  Liver function studies were normal.  Renal function was normal.  In August 2015 he had continued to be very dedicated both with exercise, as well as improved diet.  He was walking 3-4 miles per day and lost approximately 25 pounds.  He was unaware of any arrhythmia.  He denies chest pain or shortness of breath.  Followup blood work revealed marked improvement in his hemoglobin A1c from 8.0-6.1.  His lipid studies were markedly improved with triglycerides being reduced to 96 from 398.  An NMR lipoprotein showed an LDL particle number was excellent at 799, with calculated LDL 78.  HDL particle number was 37.9.  He was no longer in resistant with an insulin resistance score 37.  When I saw him  in February 2016 he complained that his chest would quiver on the left and he would notice some occasional pounding from within.  He was uncertain if he was having potential breakthrough atrial fibrillation or for these were just muscle spasms.  They have been occurring daily.  The episodes would last 5-6 seconds and then resolve.  He denied  associated presyncope or syncope or nocturnal symptoms.  At that time, I felt most likely this symptomatology was due to pectoralis muscle fasciculations rather than recurrent AF.  He wore a 2 week event monitor from 04/19/2014 through 05/02/2014.  This did not demonstrate any significant arrhythmia, but showed predominantly sinus rhythm with sinus bradycardia and rare PACs and rare isolated PVCs.  Assessment laboratory was also done which I reviewed with him in detail today.  His hemoglobin and hematocrit were stable.  Fasting glucose was mildly increased at 126.  Normal renal function and electrolytes.  TSH was borderline increased at 4.7.  His studies were normal.  Lipid studies continue to be excellent with a total cholesterol of 171, triglycerides 90, HDL 57, and LDL 96, on his current therapy.    I saw him in September 2016, he was doing  well.  He went to [place] and [place].   Later he underwent arthroscopic knee surgery which took him 3-4 months to recover.  As result, his exercise has been reduced.  He is unaware of any recurrent arrhythmia.  In September 2016 LDL cholesterol was 88.  Total cholesterol 160, triglycerides 102.   I last saw him in April 2017.  Blood work from May 2017 had shown an increase in his hemoglobin A1c to 7.0.  Over the past 6 months, he has made a conscious effort to have improved diet.  He is exercising regularly.  He denies chest pain.  He denies palpitations.  He underwent repeat blood work last week which showed a normal CBC.  Hemoglobin A1c has improved to 6.5.  Total cholesterol was 178, triglycerides 102, his LDL was 95.  He has continued to be on fenofibrate 145 mg and pravastatin 40 mg.  He denies any recurrent atrial fibrillation and is on metoprolol 50 mg daily and continues to be on eloquence.  He is on Proscar.  He presents for evaluation.  Past Medical History:  Diagnosis Date  . Diabetes mellitus without complication ([place])   . Enlarged prostate   .  Headache(784.0)    occ.  Marland Kitchen History of asthma    as a child  . Hyperlipidemia   . Hypertension   . Sleep apnea    2013    Past Surgical History:  Procedure Laterality Date  . APPENDECTOMY  1968  . CARDIAC CATHETERIZATION  09/06/1997   normal LV function, normal coronaries (Dr. Shelva Majestic)  . KNEE ARTHROSCOPY  1990'   with baker's cyst excision unsure side  . NASAL SEPTOPLASTY W/ TURBINOPLASTY Bilateral 05/25/2013   Procedure: NASAL SEPTOPLASTY WITH TURBINATE REDUCTION;  Surgeon: Jodi Marble, MD;  Location: [facility];  Service: ENT;  Laterality: Bilateral;  . NASOPHARYNGOSCOPY  1972   cyst removal  . NM MYOCAR PERF WALL MOTION  03/2006   bruce myoview - mild-mod perfusion defect in basal inferoseptal walls with mild reversibility at rest; mild to mod defect in mid inferior wall with mild reversibility at rest; EF 63%; intermediate risk scan  . TONSILLECTOMY     as a child  . TRANSTHORACIC ECHOCARDIOGRAM  03/2006   EF=>55%, mild  conc LVH; borderline RV/RA enlargement; borderline MVP, prolapse of anterior leaflets, mild MR; mild TR; AV mildly sclerotic  . UVULECTOMY N/A 05/25/2013   Procedure: Myrtis Ser ASSISTED;  Surgeon: Jodi Marble, MD;  Location: [facility];  Service: ENT;  Laterality: N/A;  . VENTRAL HERNIA REPAIR     x 2    No Known Allergies  Current Outpatient Prescriptions  Medication Sig Dispense Refill  . ELIQUIS 5 MG TABS tablet TAKE 1 TABLET(5 MG) BY MOUTH TWICE DAILY 180 tablet 1  . fenofibrate (TRICOR) 145 MG tablet TAKE 1 TABLET(145 MG) BY MOUTH DAILY 90 tablet 1  . finasteride (PROSCAR) 5 MG tablet Take 5 mg by mouth daily.    . metoprolol succinate (TOPROL-XL) 50 MG 24 hr tablet TAKE 1 TABLET BY MOUTH DAILY WITH OR IMMEDIATELY FOLLOWING A MEAL 90 tablet 1  . Multiple Vitamins-Minerals (MULTIVITAMIN WITH MINERALS) tablet Take 1 tablet by mouth daily.    . tamsulosin (FLOMAX) 0.4 MG CAPS capsule Take 0.4 mg by mouth daily.    Marland Kitchen zolpidem (AMBIEN) 5 MG tablet  Take 1 tablet by mouth at bedtime as needed. USED FOR TRAVEL  0  . rosuvastatin (CRESTOR) 10 MG tablet Take 1 tablet (10 mg total) by mouth daily. 90 tablet 3   No current facility-administered medications for this visit.     Socially he is married. He has 3 children 4 grandchildren. There is no tobacco use. He did smoke for short-term and quit in 1969. He does drink alcohol.  Family History  Problem Relation Age of Onset  . Kidney failure Mother   . Heart disease Other   . Emphysema Father   . Breast cancer Sister   . Multiple myeloma Sister      ROS General: Negative; No fevers, chills, or night sweats; positive for purposeful 6 pound weight loss HEENT: Negative; No changes in vision or hearing, sinus congestion, difficulty swallowing Pulmonary: Negative; No cough, wheezing, shortness of breath, hemoptysis Cardiovascular: See history of present illness GI: Negative; No nausea, vomiting, diarrhea, or abdominal pain GU: Negative; No dysuria, hematuria, or difficulty voiding Musculoskeletal: Negative; no myalgias, joint pain, or weakness Hematologic/Oncology: Negative; no easy bruising, bleeding Endocrine: Negative; no heat/cold intolerance; he is unaware of polydipsia or polyuria Neuro: Negative; no changes in balance, headaches Skin: Negative; No rashes or skin lesions Psychiatric: Negative; No behavioral problems, depression Sleep: Positive for sleep apnea, currently not on CPAP, but status post recent ENT surgery; No snoring, daytime sleepiness, hypersomnolence, bruxism, restless legs, hypnogognic hallucinations, no cataplexy Other comprehensive 14 point system review is negative.   PE BP 110/80 (BP Location: Left Arm, Patient Position: Sitting, Cuff Size: Normal)   Pulse 68   Ht 6' 1"  (1.854 m)   Wt 218 lb 2 oz (98.9 kg)   BMI 28.78 kg/m    Repeat blood pressure by me was 116/78.  Wt Readings from Last 3 Encounters:  03/03/16 218 lb 2 oz (98.9 kg)  02/26/16 220 lb  (99.8 kg)  07/05/15 224 lb (101.6 kg)   General: Alert, oriented, no distress.  Skin: normal turgor, no rashes HEENT: Normocephalic, atraumatic. Pupils round and reactive; sclera anicteric; extraocular muscles intact; Fundi  without hemorrhages or exudate  Nose without nasal septal hypertrophy; status post recent surgery for deviated septum and reduction of inferior turbinates Mouth/Parynx benign; he is status post recent laser surgery to the soft palate Neck: No JVD, no carotid bruits; normal carotid upstroke Lungs: clear to ausculatation and percussion; no wheezing or  rales Chest wall: without tenderness to palpitation Heart: PMI is not displaced RRR, s1 s2 normal I do not hear any arrhythmias on prolonged auscultation. Faint 1/6 systolic murmur. No S3 gallop; no rubs thrills or heaves. Abdomen: Mild central adiposity. soft, nontender; no hepatosplenomehaly, BS+; abdominal aorta nontender and not dilated by palpation. Back: no CVA tenderness Pulses 2+ Extremities: no clubbing cyanosis or edema, Homan's sign negative  Neurologic: grossly nonfocal; Cranial nerves grossly wnl Psychologic: Normal mood and affect  ECG (independently read by me): Normal sinus rhythm at 68 bpm.  Rare PAC, occasional PVC.  Nonspecific ST changes.  April 2017 ECG (independently read by me): Normal sinus rhythm with PAC pattern of atrial bigeminy  September 2016 ECG (independently read by me): Sinus rhythm with PAC's, atrial bigeminy for which he is entirely asymptomatic.  05/10/2014 ECG (independently read by me): Sinus rhythm with sinus arrhythmia and  PACs with an atrial bigeminal rhythm  04/19/2014 ECG (independently read by me): Normal sinus rhythm at 61 bpm.  One isolated PVC.  No significant ST segment changes.  Normal intervals.  Prior ECG (independently read by me) normal sinus rhythm at 64 beats per minute.  Normal intervals.  No ST segment changes.  ECG on 06/23/13 (independently read by me) normal  sinus rhythm at 63 beats per minute.  PR interval 150 ms, QTc interval 433 ms  Prior 06/15/2013 ECG (independently read by me): Normal sinus rhythm at 72 beats per minute. PR interval 144 ms; QTc interval 451 ms  LABS:  BMP Latest Ref Rng & Units 02/25/2016 08/15/2015 11/28/2014  Glucose 65 - 99 mg/dL 154(H) 149(H) 130(H)  BUN 7 - 25 mg/dL 21 21 22   Creatinine 0.70 - 1.18 mg/dL 0.94 1.01 0.91  Sodium 135 - 146 mmol/L 139 138 139  Potassium 3.5 - 5.3 mmol/L 4.7 4.7 4.9  Chloride 98 - 110 mmol/L 105 103 105  CO2 20 - 31 mmol/L 27 26 29   Calcium 8.6 - 10.3 mg/dL 9.3 9.4 9.3    Hepatic Function Latest Ref Rng & Units 02/25/2016 08/15/2015 11/28/2014  Total Protein 6.1 - 8.1 g/dL 6.6 6.7 6.8  Albumin 3.6 - 5.1 g/dL 4.2 4.3 4.3  AST 10 - 35 U/L 20 23 19   ALT 9 - 46 U/L 23 27 21   Alk Phosphatase 40 - 115 U/L 32(L) 40 37(L)  Total Bilirubin 0.2 - 1.2 mg/dL 0.5 0.4 0.4    CBC Latest Ref Rng & Units 02/25/2016 08/15/2015 11/28/2014  WBC 3.8 - 10.8 K/uL 6.6 7.6 7.3  Hemoglobin 13.2 - 17.1 g/dL 14.1 14.2 14.1  Hematocrit 38.5 - 50.0 % 42.9 41.7 42.5  Platelets 140 - 400 K/uL 244 259 280   Lab Results  Component Value Date   MCV 90.1 02/25/2016   MCV 89.5 08/15/2015   MCV 89.5 11/28/2014   Lab Results  Component Value Date   TSH 4.32 02/25/2016   Lab Results  Component Value Date   HGBA1C 6.5 (H) 02/25/2016   No results found for: PROBNP  Lipid Panel     Component Value Date/Time   CHOL 178 02/25/2016 0803   CHOL 152 10/25/2013 0804   TRIG 102 02/25/2016 0803   TRIG 96 10/25/2013 0804   HDL 63 02/25/2016 0803   HDL 55 10/25/2013 0804   CHOLHDL 2.8 02/25/2016 0803   VLDL 20 02/25/2016 0803   LDLCALC 95 02/25/2016 0803   LDLCALC 78 10/25/2013 0804    NMR Profile 10/25/2013   Ref Range 24moago  78moago      LDL Particle Number <1000 nmol/L 799    Comments: Reference Range  Low < 1000  Moderate 1000 - 1299  Borderline-High 1300 - 1599  High 1600 - 2000  Very  High > 2000     LDL (calc) <100 mg/dL 78 72R, CM   Comments: LDL-C is inaccurate if patient is nonfasting. Reference Range  Optimal < 100  Near/Above Optimal 100 - 129  Borderline High 130 - 159  High 160 - 189  Very High >= 190     HDL-C >=40 mg/dL 55     Triglycerides <150 mg/dL 96 398 (H)    Cholesterol, Total <200 mg/dL 152     HDL Particle Number >=30.5 umol/L 37.9     Large HDL-P >=4.8 umol/L 7.6     Large VLDL-P <=2.7 nmol/L 2.5     Small LDL Particle Number <=527 nmol/L 275     LDL Size >20.5 nm 20.9     HDL Size >=9.2 nm 9.0 (L)     VLDL Size <=46.6 nm 41.4     LP-IR Score <=45  37       RADIOLOGY: No results found.   IMPRESSION:  1. PA[place] ([place]ox[place]   2. Mixed hyperlipidemia   3. Encounter for long-term (current) use of medications   4. Metabolic syndrome      ASSESSMENT AND PLAN: Mr. JJadin Crequeis a 75year old gentleman who had documented normal coronary arteries and normal LV function at cardiac catheterization in1999 after a nuclear perfusion study raised the possibility of subtle ischemia. In 2015, he had developed overt diabetes, and also had episodes of paroxysmal atrial fibrillation.  With significant lifestyle adjustment he lost approximately 25.  His blood pressure is normalized and his hemoglobin A1c significantly improved.  He is not aware of any recurrent episodes of PAF and continues to do well with Eliquis anticoagulation.  I reviewed his lab work from May 2017 as well as last week.  He has lost 6 pounds over that time period.  His hemoglobin see had risen up to 7.  On May 31 and had improved to 6.5, which is still at the diabetic threshold.  I discussed the importance of further weight loss.  His LDL cholesterol is 95 despite being on pravastatin 40 mg and his fenofibrate.  His triglycerides were normal.  With his metabolic syndrome/diabetes, I have recommended discontinuance of pravastatin and will start him on  Crestor, initially at 10 mg.  In 3 months.  Repeat lipid panel and chemistry profile will be obtained and if his LDL cholesterol is above 70.  Further titration of Crestor will be done at that time.  He is asymptomatic with reference to his PVCs and continues to be on Toprol-XL 50 g daily.  His blood pressure is stable.  BMI is 28.7.  Repeat blood work will be obtained in 3 months and I will see him in 6 months for cardiology reevaluation.  TTroy Sine MD, [facility]12/18/2017 8:40 AM

## 2016-03-03 NOTE — Patient Instructions (Addendum)
Your physician has recommended you make the following change in your medication:   1.) STOP the pravastatin. This has been replaced with generic Crestor 10 mg.  Your physician recommends that you return for lab work in: 2 months.  Your physician wants you to follow-up in: 6 months or sooner if needed. You will receive a reminder letter in the mail two months in advance. If you don't receive a letter, please call our office to schedule the follow-up appointment.   If you need a refill on your cardiac medications before your next appointment, please call your pharmacy.

## 2016-03-04 DIAGNOSIS — Z1212 Encounter for screening for malignant neoplasm of rectum: Secondary | ICD-10-CM | POA: Diagnosis not present

## 2016-03-24 DIAGNOSIS — R509 Fever, unspecified: Secondary | ICD-10-CM | POA: Diagnosis not present

## 2016-03-24 DIAGNOSIS — J209 Acute bronchitis, unspecified: Secondary | ICD-10-CM | POA: Diagnosis not present

## 2016-03-24 DIAGNOSIS — R05 Cough: Secondary | ICD-10-CM | POA: Diagnosis not present

## 2016-03-24 DIAGNOSIS — Z6829 Body mass index (BMI) 29.0-29.9, adult: Secondary | ICD-10-CM | POA: Diagnosis not present

## 2016-03-28 ENCOUNTER — Encounter: Payer: BLUE CROSS/BLUE SHIELD | Admitting: Internal Medicine

## 2016-04-01 DIAGNOSIS — L570 Actinic keratosis: Secondary | ICD-10-CM | POA: Diagnosis not present

## 2016-04-01 DIAGNOSIS — L111 Transient acantholytic dermatosis [Grover]: Secondary | ICD-10-CM | POA: Diagnosis not present

## 2016-04-01 DIAGNOSIS — L821 Other seborrheic keratosis: Secondary | ICD-10-CM | POA: Diagnosis not present

## 2016-04-01 DIAGNOSIS — Z85828 Personal history of other malignant neoplasm of skin: Secondary | ICD-10-CM | POA: Diagnosis not present

## 2016-04-01 DIAGNOSIS — L820 Inflamed seborrheic keratosis: Secondary | ICD-10-CM | POA: Diagnosis not present

## 2016-04-01 DIAGNOSIS — D1801 Hemangioma of skin and subcutaneous tissue: Secondary | ICD-10-CM | POA: Diagnosis not present

## 2016-05-01 ENCOUNTER — Ambulatory Visit (INDEPENDENT_AMBULATORY_CARE_PROVIDER_SITE_OTHER): Payer: BLUE CROSS/BLUE SHIELD | Admitting: Orthopaedic Surgery

## 2016-05-01 ENCOUNTER — Encounter (INDEPENDENT_AMBULATORY_CARE_PROVIDER_SITE_OTHER): Payer: Self-pay | Admitting: Orthopaedic Surgery

## 2016-05-01 VITALS — BP 114/64 | HR 67 | Resp 16 | Ht 74.0 in | Wt 202.0 lb

## 2016-05-01 DIAGNOSIS — G8929 Other chronic pain: Secondary | ICD-10-CM | POA: Diagnosis not present

## 2016-05-01 DIAGNOSIS — M545 Low back pain: Secondary | ICD-10-CM

## 2016-05-01 NOTE — Progress Notes (Signed)
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 I her MRI scan demonstrated an area of moderate spinal stenosis at L3-4 but Victor Sanchez is not experiencing claudication   Plan: Consult Victor Sanchez to consider either bilateral facet injections at L3-4 given prior MRI scan demonstrating moderate to severe facet arthropathy versus epidural steroid injection. He had a long discussion regarding the prior MRI scan findings. On is planning several trips with his wife in the near future and would like to feel better. I think it's worth having Victor Sanchez evaluate him as mentioned above and possibly consider repeat MRI scan if no improvement with the injections. Had a prior course of physical therapy and is aware the appropriate exercises  Follow-Up Instructions: No Follow-up on file.   Orders:  No orders of the defined types were placed in this encounter.  No orders of the defined types were placed in this encounter.     Procedures: No procedures performed   Clinical Data: No additional findings.   Subjective: No chief complaint on file.   Pt presents with chronic lumbar pain, interrupts his sleep, the slightest movement irritates the back muscles. He wants some relief from this back pain, tried lumbar injections 30 years ago.   [name] is had a problem with his back for many many years. He denies any pain referable to either lower extremity, radiculopathy or claudication. The pain certainly interferes with many of his activities including sleeping and walking. I abdominal problems or bowel or bladder dysfunction. He is not had any numbness or tingling. He did have an MRI scan performed  05/28/2014 that demonstrated grade 1 anterolisthesis at L3-4 with moderate to severe facet arthropathy.  A small right-sided synovial cyst which contributed to moderate right lateral recess stenosis and moderate spinal stenosis overall.  Review of Systems   Objective: Vital Signs: There were no vitals taken for this visit.  Physical Exam  Ortho Exam Victor Sanchez walks without a limp. Straight leg raise is negative bilaterally. Reflexes symmetrical. Painless range of motion of both hips. He does have arthritis in his left knee but no effusion today and mild medial joint pain. No percussible tenderness of his lumbar spine.  Specialty Comments:  No specialty comments available.  Imaging: No results found.   PMFS History: Patient Active Problem List   Diagnosis Date Noted  . Palpitations 05/10/2014  . Metabolic syndrome 19/14/7829  . Type 2 diabetes mellitus ([place]) 07/06/2013  . PAF (paroxysmal atrial fibrillation) ([place] [facility]) 07/06/2013  . Encounter for anticoagulation discussion and counseling 07/06/2013  . Cardiac arrhythmia 06/17/2013  . Hyperlipidemia 06/17/2013  . Mild obesity 06/17/2013  . Abnormal fasting glucose 06/17/2013  . Deviated nasal septum 05/25/2013  . OSA (obstructive sleep apnea) 05/25/2013  . Nasal septal deviation 05/25/2013   Past Medical History:  Diagnosis Date  . Diabetes mellitus without complication ([place])   . Enlarged prostate   . Headache(784.0)    occ.  Marland Kitchen History of asthma    as a child  . Hyperlipidemia   . Hypertension   . Sleep apnea  2013    Family History  Problem Relation Age of Onset  . Kidney failure Mother   . Heart disease Other   . Emphysema Father   . Breast cancer Sister   . Multiple myeloma Sister     Past Surgical History:  Procedure Laterality Date  . APPENDECTOMY  1968  . CARDIAC CATHETERIZATION  09/06/1997   normal LV function, normal coronaries (Dr. Shelva Sanchez)  . KNEE ARTHROSCOPY  1990'   with baker's cyst excision unsure  side  . NASAL SEPTOPLASTY W/ TURBINOPLASTY Bilateral 05/25/2013   Procedure: NASAL SEPTOPLASTY WITH TURBINATE REDUCTION;  Surgeon: Victor Marble, MD;  Location: [facility];  Service: ENT;  Laterality: Bilateral;  . NASOPHARYNGOSCOPY  1972   cyst removal  . NM MYOCAR PERF WALL MOTION  03/2006   bruce myoview - mild-mod perfusion defect in basal inferoseptal walls with mild reversibility at rest; mild to mod defect in mid inferior wall with mild reversibility at rest; EF 63%; intermediate risk scan  . TONSILLECTOMY     as a child  . TRANSTHORACIC ECHOCARDIOGRAM  03/2006   EF=>55%, mild conc LVH; borderline RV/RA enlargement; borderline MVP, prolapse of anterior leaflets, mild MR; mild TR; AV mildly sclerotic  . UVULECTOMY N/A 05/25/2013   Procedure: Victor Sanchez ASSISTED;  Surgeon: Victor Marble, MD;  Location: [facility];  Service: ENT;  Laterality: N/A;  . VENTRAL HERNIA REPAIR     x 2   Social History   Occupational History  .  [employer]   Social History Main Topics  . Smoking status: Former Smoker    Packs/day: 1.00    Years: 6.00    Quit date: 03/18/1967  . Smokeless tobacco: Never Used  . Alcohol use 4.2 oz/week    7 Glasses of wine per week     Comment: glass of wine or cocktail daily  . Drug use: No  . Sexual activity: Not on file

## 2016-05-05 ENCOUNTER — Ambulatory Visit (INDEPENDENT_AMBULATORY_CARE_PROVIDER_SITE_OTHER): Payer: BLUE CROSS/BLUE SHIELD | Admitting: Orthopaedic Surgery

## 2016-05-09 DIAGNOSIS — G4700 Insomnia, unspecified: Secondary | ICD-10-CM | POA: Diagnosis not present

## 2016-05-09 DIAGNOSIS — G4733 Obstructive sleep apnea (adult) (pediatric): Secondary | ICD-10-CM | POA: Diagnosis not present

## 2016-05-09 DIAGNOSIS — M545 Low back pain: Secondary | ICD-10-CM | POA: Diagnosis not present

## 2016-05-09 DIAGNOSIS — R5381 Other malaise: Secondary | ICD-10-CM | POA: Diagnosis not present

## 2016-05-28 ENCOUNTER — Telehealth (INDEPENDENT_AMBULATORY_CARE_PROVIDER_SITE_OTHER): Payer: Self-pay | Admitting: Orthopaedic Surgery

## 2016-05-28 NOTE — Telephone Encounter (Signed)
[facility] per signed release form

## 2016-06-01 ENCOUNTER — Other Ambulatory Visit: Payer: Self-pay | Admitting: Cardiovascular Disease

## 2016-06-03 DIAGNOSIS — M1712 Unilateral primary osteoarthritis, left knee: Secondary | ICD-10-CM | POA: Diagnosis not present

## 2016-06-03 DIAGNOSIS — M25562 Pain in left knee: Secondary | ICD-10-CM | POA: Diagnosis not present

## 2016-06-03 DIAGNOSIS — M25561 Pain in right knee: Secondary | ICD-10-CM | POA: Diagnosis not present

## 2016-06-03 DIAGNOSIS — G8929 Other chronic pain: Secondary | ICD-10-CM | POA: Diagnosis not present

## 2016-07-07 DIAGNOSIS — M25562 Pain in left knee: Secondary | ICD-10-CM | POA: Diagnosis not present

## 2016-07-07 DIAGNOSIS — M1712 Unilateral primary osteoarthritis, left knee: Secondary | ICD-10-CM | POA: Diagnosis not present

## 2016-07-17 DIAGNOSIS — M25562 Pain in left knee: Secondary | ICD-10-CM | POA: Diagnosis not present

## 2016-07-22 DIAGNOSIS — M23332 Other meniscus derangements, other medial meniscus, left knee: Secondary | ICD-10-CM | POA: Diagnosis not present

## 2016-07-22 DIAGNOSIS — M1712 Unilateral primary osteoarthritis, left knee: Secondary | ICD-10-CM | POA: Diagnosis not present

## 2016-07-22 DIAGNOSIS — M23362 Other meniscus derangements, other lateral meniscus, left knee: Secondary | ICD-10-CM | POA: Diagnosis not present

## 2016-07-23 DIAGNOSIS — I10 Essential (primary) hypertension: Secondary | ICD-10-CM | POA: Diagnosis not present

## 2016-07-23 DIAGNOSIS — Z7901 Long term (current) use of anticoagulants: Secondary | ICD-10-CM | POA: Diagnosis not present

## 2016-07-23 DIAGNOSIS — G4733 Obstructive sleep apnea (adult) (pediatric): Secondary | ICD-10-CM | POA: Diagnosis not present

## 2016-07-23 DIAGNOSIS — E119 Type 2 diabetes mellitus without complications: Secondary | ICD-10-CM | POA: Diagnosis not present

## 2016-07-23 DIAGNOSIS — R946 Abnormal results of thyroid function studies: Secondary | ICD-10-CM | POA: Diagnosis not present

## 2016-07-23 DIAGNOSIS — E781 Pure hyperglyceridemia: Secondary | ICD-10-CM | POA: Diagnosis not present

## 2016-07-29 DIAGNOSIS — M1712 Unilateral primary osteoarthritis, left knee: Secondary | ICD-10-CM | POA: Diagnosis not present

## 2016-08-05 DIAGNOSIS — M1712 Unilateral primary osteoarthritis, left knee: Secondary | ICD-10-CM | POA: Diagnosis not present

## 2016-08-06 DIAGNOSIS — H2513 Age-related nuclear cataract, bilateral: Secondary | ICD-10-CM | POA: Diagnosis not present

## 2016-08-15 DIAGNOSIS — M25562 Pain in left knee: Secondary | ICD-10-CM | POA: Diagnosis not present

## 2016-08-15 DIAGNOSIS — M1712 Unilateral primary osteoarthritis, left knee: Secondary | ICD-10-CM | POA: Diagnosis not present

## 2016-08-21 ENCOUNTER — Telehealth: Payer: Self-pay | Admitting: Cardiovascular Disease

## 2016-08-21 NOTE — Telephone Encounter (Signed)
Spoke with pt, he is having knee replacement surgery scheduled 09-23-16. The patient is asking if he needs to be seen for clearance. [facility] is doing the procedure, will forward to dr Tresa Endokelly to review and advise if clear for surgery or will need to be seen for clearance.

## 2016-08-21 NOTE — Telephone Encounter (Signed)
New Message      Pt wants to speak with Burna MortimerWanda , can he wait until September for appt

## 2016-08-22 ENCOUNTER — Encounter (HOSPITAL_COMMUNITY): Payer: Self-pay | Admitting: *Deleted

## 2016-08-29 ENCOUNTER — Other Ambulatory Visit: Payer: Self-pay | Admitting: Cardiovascular Disease

## 2016-08-29 NOTE — Telephone Encounter (Signed)
Attempt to call line busy

## 2016-08-29 NOTE — Telephone Encounter (Signed)
OK to have surgery, but will need to hold eliquis for 48 hrs prior to surgery.

## 2016-09-01 NOTE — Telephone Encounter (Signed)
Will forward this note to [facility].

## 2016-09-09 NOTE — H&P (Signed)
TOTAL KNEE ADMISSION H&P  Patient is being admitted for left total knee arthroplasty.  Subjective:  Chief Complaint:   Left knee primary OA /pain  HPI: Victor Sanchez, 76 y.o. male, has a history of pain and functional disability in the left knee due to arthritis and has failed non-surgical conservative treatments for greater than 12 weeks to include NSAID's and/or analgesics, corticosteriod injections, viscosupplementation injections and activity modification.  Onset of symptoms was gradual, starting 2.5+ years ago with gradually worsening course since that time. The patient noted prior procedures on the knee to include  arthroscopy on the left knee(s).  Patient currently rates pain in the left knee(s) at 7 out of 10 with activity. Patient has worsening of pain with activity and weight bearing, pain that interferes with activities of daily living, pain with passive range of motion, crepitus and joint swelling.  Patient has evidence of periarticular osteophytes and joint space narrowing by imaging studies. There is no active infection.   Risks, benefits and expectations were discussed with the patient.  Risks including but not limited to the risk of anesthesia, blood clots, nerve damage, blood vessel damage, failure of the prosthesis, infection and up to and including death.  Patient understand the risks, benefits and expectations and wishes to proceed with surgery.   PCP: Haywood Pao, MD  D/C Plans:       Home   Post-op Meds:       No Rx given  Tranexamic Acid:      To be given - IV   Decadron:      Is to be given  FYI:     Eliquis  Norco  DME:  Rx given for - RW and 3-n-1  PT:   Rx given for OPPT   Patient Active Problem List   Diagnosis Date Noted  . Palpitations 05/10/2014  . Metabolic syndrome 93/57/0177  . Type 2 diabetes mellitus ([facility]) 07/06/2013  . PAF (paroxysmal atrial fibrillation) ([place]) 07/06/2013  . Encounter for anticoagulation discussion and counseling 07/06/2013   . Cardiac arrhythmia 06/17/2013  . Hyperlipidemia 06/17/2013  . Mild obesity 06/17/2013  . Abnormal fasting glucose 06/17/2013  . Deviated nasal septum 05/25/2013  . OSA (obstructive sleep apnea) 05/25/2013  . Nasal septal deviation 05/25/2013   Past Medical History:  Diagnosis Date  . Diabetes mellitus without complication ([place])   . Enlarged prostate   . Headache(784.0)    occ.  Marland Kitchen History of asthma    as a child  . Hyperlipidemia   . Hypertension   . Sleep apnea    2013    Past Surgical History:  Procedure Laterality Date  . APPENDECTOMY  1968  . CARDIAC CATHETERIZATION  09/06/1997   normal LV function, normal coronaries (Dr. Shelva Majestic)  . KNEE ARTHROSCOPY  1990'   with baker's cyst excision unsure side  . NASAL SEPTOPLASTY W/ TURBINOPLASTY Bilateral 05/25/2013   Procedure: NASAL SEPTOPLASTY WITH TURBINATE REDUCTION;  Surgeon: Jodi Marble, MD;  Location: [facility];  Service: ENT;  Laterality: Bilateral;  . NASOPHARYNGOSCOPY  1972   cyst removal  . NM MYOCAR PERF WALL MOTION  03/2006   bruce myoview - mild-mod perfusion defect in basal inferoseptal walls with mild reversibility at rest; mild to mod defect in mid inferior wall with mild reversibility at rest; EF 63%; intermediate risk scan  . TONSILLECTOMY     as a child  . TRANSTHORACIC ECHOCARDIOGRAM  03/2006   EF=>55%, mild conc LVH; borderline RV/RA enlargement; borderline MVP,  prolapse of anterior leaflets, mild MR; mild TR; AV mildly sclerotic  . UVULECTOMY N/A 05/25/2013   Procedure: Myrtis Ser ASSISTED;  Surgeon: Jodi Marble, MD;  Location: [facility];  Service: ENT;  Laterality: N/A;  . VENTRAL HERNIA REPAIR     x 2    No prescriptions prior to admission.   No Known Allergies   Social History  Substance Use Topics  . Smoking status: Former Smoker    Packs/day: 1.00    Years: 6.00    Quit date: 03/18/1967  . Smokeless tobacco: Never Used  . Alcohol use 4.2 oz/week    7 Glasses of wine per week      Comment: glass of wine or cocktail daily    Family History  Problem Relation Age of Onset  . Kidney failure Mother   . Emphysema Father   . Heart disease Other   . Breast cancer Sister   . Multiple myeloma Sister      Review of Systems  Constitutional: Negative.   HENT: Negative.   Eyes: Negative.   Respiratory: Negative.   Cardiovascular: Negative.   Gastrointestinal: Negative.   Genitourinary: Positive for frequency.  Musculoskeletal: Positive for back pain and joint pain.  Skin: Negative.   Neurological: Negative.   Endo/Heme/Allergies: Negative.   Psychiatric/Behavioral: Negative.     Objective:  Physical Exam  Constitutional: He is oriented to person, place, and time. He appears well-developed.  HENT:  Head: Normocephalic.  Eyes: Pupils are equal, round, and reactive to light.  Neck: Neck supple. No JVD present. No tracheal deviation present. No thyromegaly present.  Cardiovascular: Normal rate, regular rhythm and intact distal pulses.   Respiratory: Effort normal and breath sounds normal. No respiratory distress. He has no wheezes.  GI: Soft. There is no tenderness. There is no guarding.  Musculoskeletal:       Left knee: He exhibits decreased range of motion, swelling and bony tenderness. He exhibits no ecchymosis, no deformity, no laceration and no erythema. Tenderness found.  Lymphadenopathy:    He has no cervical adenopathy.  Neurological: He is alert and oriented to person, place, and time.  Skin: Skin is warm and dry.  Psychiatric: He has a normal mood and affect.      Labs:  Estimated body mass index is 25.94 kg/m as calculated from the following:   Height as of 05/01/16: '6\' 2"'$  (1.88 m).   Weight as of 05/01/16: 91.6 kg (202 lb).   Imaging Review Plain radiographs demonstrate severe degenerative joint disease of the left knee(s).  The bone quality appears to be good for age and reported activity level.  Assessment/Plan:  End stage arthritis,  left knee   The patient history, physical examination, clinical judgment of the provider and imaging studies are consistent with end stage degenerative joint disease of the left knee(s) and total knee arthroplasty is deemed medically necessary. The treatment options including medical management, injection therapy arthroscopy and arthroplasty were discussed at length. The risks and benefits of total knee arthroplasty were presented and reviewed. The risks due to aseptic loosening, infection, stiffness, patella tracking problems, thromboembolic complications and other imponderables were discussed. The patient acknowledged the explanation, agreed to proceed with the plan and consent was signed. Patient is being admitted for inpatient treatment for surgery, pain control, PT, OT, prophylactic antibiotics, VTE prophylaxis, progressive ambulation and ADL's and discharge planning. The patient is planning to be discharged home.      [place] Sophie Quiles   PA-C  09/09/2016, 10:26  PM  

## 2016-09-18 ENCOUNTER — Other Ambulatory Visit (HOSPITAL_COMMUNITY): Payer: BLUE CROSS/BLUE SHIELD

## 2016-09-19 DIAGNOSIS — M25562 Pain in left knee: Secondary | ICD-10-CM | POA: Diagnosis not present

## 2016-09-19 DIAGNOSIS — M1712 Unilateral primary osteoarthritis, left knee: Secondary | ICD-10-CM | POA: Diagnosis not present

## 2016-09-19 DIAGNOSIS — M1711 Unilateral primary osteoarthritis, right knee: Secondary | ICD-10-CM | POA: Diagnosis not present

## 2016-09-19 DIAGNOSIS — M25561 Pain in right knee: Secondary | ICD-10-CM | POA: Diagnosis not present

## 2016-09-23 ENCOUNTER — Encounter (HOSPITAL_COMMUNITY): Payer: Self-pay

## 2016-09-23 ENCOUNTER — Inpatient Hospital Stay (HOSPITAL_COMMUNITY): Admit: 2016-09-23 | Payer: BLUE CROSS/BLUE SHIELD | Admitting: Orthopedic Surgery

## 2016-09-23 SURGERY — ARTHROPLASTY, KNEE, TOTAL
Anesthesia: Spinal | Site: Knee | Laterality: Left

## 2016-09-28 ENCOUNTER — Other Ambulatory Visit: Payer: Self-pay | Admitting: Cardiovascular Disease

## 2016-10-28 DIAGNOSIS — E119 Type 2 diabetes mellitus without complications: Secondary | ICD-10-CM | POA: Diagnosis not present

## 2016-10-28 DIAGNOSIS — Z7901 Long term (current) use of anticoagulants: Secondary | ICD-10-CM | POA: Diagnosis not present

## 2016-10-28 DIAGNOSIS — I480 Paroxysmal atrial fibrillation: Secondary | ICD-10-CM | POA: Diagnosis not present

## 2016-10-28 DIAGNOSIS — E781 Pure hyperglyceridemia: Secondary | ICD-10-CM | POA: Diagnosis not present

## 2016-10-31 DIAGNOSIS — M25561 Pain in right knee: Secondary | ICD-10-CM | POA: Diagnosis not present

## 2016-10-31 DIAGNOSIS — G8929 Other chronic pain: Secondary | ICD-10-CM | POA: Diagnosis not present

## 2016-10-31 DIAGNOSIS — M25562 Pain in left knee: Secondary | ICD-10-CM | POA: Diagnosis not present

## 2016-10-31 DIAGNOSIS — M1712 Unilateral primary osteoarthritis, left knee: Secondary | ICD-10-CM | POA: Diagnosis not present

## 2016-11-12 DIAGNOSIS — L821 Other seborrheic keratosis: Secondary | ICD-10-CM | POA: Diagnosis not present

## 2016-11-12 DIAGNOSIS — Z85828 Personal history of other malignant neoplasm of skin: Secondary | ICD-10-CM | POA: Diagnosis not present

## 2016-11-12 DIAGNOSIS — L570 Actinic keratosis: Secondary | ICD-10-CM | POA: Diagnosis not present

## 2016-11-12 DIAGNOSIS — C44619 Basal cell carcinoma of skin of left upper limb, including shoulder: Secondary | ICD-10-CM | POA: Diagnosis not present

## 2016-11-12 DIAGNOSIS — D1801 Hemangioma of skin and subcutaneous tissue: Secondary | ICD-10-CM | POA: Diagnosis not present

## 2016-11-12 DIAGNOSIS — L814 Other melanin hyperpigmentation: Secondary | ICD-10-CM | POA: Diagnosis not present

## 2016-12-04 ENCOUNTER — Telehealth (INDEPENDENT_AMBULATORY_CARE_PROVIDER_SITE_OTHER): Payer: Self-pay | Admitting: Orthopaedic Surgery

## 2016-12-04 NOTE — Telephone Encounter (Signed)
Patient has been experiencing back pain (which he last saw Dr. Cleophas Dunker for in 04/2016) and would like to know if Dr. Cleophas Dunker would like to see him or refer him to one of the back specialist at [facility]. Please advise.

## 2016-12-04 NOTE — Telephone Encounter (Signed)
Have him come back to see me first-thanks

## 2016-12-04 NOTE — Telephone Encounter (Signed)
Please schedule with PW

## 2016-12-04 NOTE — Telephone Encounter (Signed)
Please advise 

## 2016-12-05 DIAGNOSIS — M545 Low back pain: Secondary | ICD-10-CM | POA: Diagnosis not present

## 2016-12-08 NOTE — Telephone Encounter (Signed)
Called patient to schedule and he states he is actually doing quite a bit better than he was so he will call back when he needs an appointment.

## 2016-12-16 ENCOUNTER — Ambulatory Visit (INDEPENDENT_AMBULATORY_CARE_PROVIDER_SITE_OTHER): Payer: BLUE CROSS/BLUE SHIELD | Admitting: Cardiovascular Disease

## 2016-12-16 ENCOUNTER — Encounter: Payer: Self-pay | Admitting: Cardiovascular Disease

## 2016-12-16 VITALS — BP 122/84 | HR 62 | Ht 73.0 in | Wt 202.2 lb

## 2016-12-16 DIAGNOSIS — E8881 Metabolic syndrome: Secondary | ICD-10-CM

## 2016-12-16 DIAGNOSIS — E782 Mixed hyperlipidemia: Secondary | ICD-10-CM

## 2016-12-16 DIAGNOSIS — I480 Paroxysmal atrial fibrillation: Secondary | ICD-10-CM | POA: Diagnosis not present

## 2016-12-16 DIAGNOSIS — I10 Essential (primary) hypertension: Secondary | ICD-10-CM

## 2016-12-16 DIAGNOSIS — Z7901 Long term (current) use of anticoagulants: Secondary | ICD-10-CM

## 2016-12-16 NOTE — Patient Instructions (Signed)
Medication Instructions:  Your physician recommends that you continue on your current medications as directed. Please refer to the Current Medication list given to you today.  Follow-Up: Your physician wants you to follow-up in: 12 MONTHS with Dr. Kelly. You will receive a reminder letter in the mail two months in advance. If you don't receive a letter, please call our office to schedule the follow-up appointment.   Any Other Special Instructions Will Be Listed Below (If Applicable).     If you need a refill on your cardiac medications before your next appointment, please call your pharmacy.   

## 2016-12-16 NOTE — Progress Notes (Signed)
Patient ID: [name], male   DOB: 05/31/1940, 76 y.o.   MRN: [id number]    Primary MD: Dr. Osborne Sanchez  HPI: [name] is a 76 year old white male who presents for an 10 month follow-up cardiology evaluation.  Victor Sanchez developed  atypical chest pain in 1999. A nuclear perfusion study raised the possibility of focal inferior scar with possible inferolateral redistribution. Cardiac catheterization revealed normal LV function and normal coronary arteries. In 2008 a nuclear study showed diaphragmatic attenuation although the ormal interpretation was interpreted as possible ischemia. In January 2015 he developed URI symptoms and had  significant difficulty with sleep. He also had significant deviated septum, and has had episodes with paroxysmal positional vertigo. As part of his preoperative evaluation for ENT surgery he was found to have possible abnormal heart rhythms. He underwent his ENT surgery for with a nasal septoplasty, reduction of his inferior turbinates, and LAUP. When Dr. Erik Sanchez saw him  he was concerned about the possibility of atrial fibrillation.  He reestablished care with me last year following this episode.  At that time prior to a planned trip to [place] for 2 weeks I recommended that he undergo laboratory as well as wear a CardioNet monitor.  Blood work was abnormal in that his total cholesterol was 203, triglycerides 398, HDL 51, VLDL 80 LDL 72.  Magnesium was normal.  His fasting glucose was 165 and the hemoglobin A1c was elevated at 8.0 indicative of a mean plasma glucose of approximately 183.  He had normal hemoglobin and hematocrit.  A 2-D echo Doppler study revealed an ejection fraction in the 55-60%.  On his monitor, he developed recurrent episodes of paroxysmal atrial fibrillation with rates in the low 100s, but in one instance  he developed a 10 beat run of PSVT approximately 178 beats per minute.  He was contacted regarding these results and was brought into the office on  06/23/2013.  An echo Doppler  revealed mild aortic valve sclerosis, mild tricuspid regurgitation, and mild MR, with normal systolic function.  His laboratory was suggestive of type 2 diabetes mellitus.  With his episodes of PAF noted on his monitor.  I recommended institution of low-dose Toprol initially at 25 mg and titrate this to 50 mg.  I also extended his cardiac monitor beyond the initial 10 days.  I recommended the addition of fenofibrate 145 mg in light of his significant triglyceride elevation and also suggested initiation of metformin 500 mg daily with plans for primary care followup.  The subsequent CardioNet monitor did not demonstrate any further episodes of PAF on his increased beta blocker therapy although he did have some sinus tachycardia during activity.  Repeat blood work was significantly improved with his fasting glucose improved from 165, and remotely, 202.  Liver function studies were normal.  Renal function was normal.  In August 2015 he had continued to be very dedicated both with exercise, as well as improved diet.  He was walking 3-4 miles per day and lost approximately 25 pounds.  He was unaware of any arrhythmia.  He denies chest pain or shortness of breath.  Followup blood work revealed marked improvement in his hemoglobin A1c from 8.0-6.1.  His lipid studies were markedly improved with triglycerides being reduced to 96 from 398.  An NMR lipoprotein showed an LDL particle number was excellent at 799, with calculated LDL 78.  HDL particle number was 37.9.  He was no longer in resistant with an insulin resistance score 37.  When I saw him  in February 2016 he complained that his chest would quiver on the left and he would notice some occasional pounding from within.  He was uncertain if he was having potential breakthrough atrial fibrillation or for these were just muscle spasms.  They have been occurring daily.  The episodes would last 5-6 seconds and then resolve.  He denied  associated presyncope or syncope or nocturnal symptoms.  At that time, I felt most likely this symptomatology was due to pectoralis muscle fasciculations rather than recurrent AF.  He wore a 2 week event monitor from 04/19/2014 through 05/02/2014.  This did not demonstrate any significant arrhythmia, but showed predominantly sinus rhythm with sinus bradycardia and rare PACs and rare isolated PVCs.  Assessment laboratory was also done which I reviewed with him in detail today.  His hemoglobin and hematocrit were stable.  Fasting glucose was mildly increased at 126.  Normal renal function and electrolytes.  TSH was borderline increased at 4.7.  His studies were normal.  Lipid studies continue to be excellent with a total cholesterol of 171, triglycerides 90, HDL 57, and LDL 96, on his current therapy.    When I saw him in September 2016, he was doing  well.  He went to [place] and [place].   Later he underwent arthroscopic knee surgery which took him 3-4 months to recover.  As result, his exercise has been reduced.  He is unaware of any recurrent arrhythmia.  In September 2016 LDL cholesterol was 88.  Total cholesterol 160, triglycerides 102.   Blood work from May 2017 had shown an increase in his hemoglobin A1c to 7.0.  Over the past 6 months, he has made a conscious effort to have improved diet.  He is exercising regularly.  He denies chest pain.  He denies palpitations.  He underwent repeat blood work last week which showed a normal CBC.  Hemoglobin A1c has improved to 6.5.  Total cholesterol was 178, triglycerides 102, his LDL was 95.  He has continued to be on fenofibrate 145 mg and pravastatin 40 mg.    Since I last saw him, he has felt well.  He remains active and exercises regularly. He has been successful with weight loss and his weight is down to 202 pounds. He traveled to [place] and the [employer].  He has had some issues with his left knee and was contemplating surgery but ultimately this  was aborted.  He is unaware of any recurrent atrial fibrillation.  He tells me Dr. Rosana Hoes has recently checked his blood work and his hemoglobin A1c was 6.3.  He continues to be on Toprol-XL 50 mg with control of palpitations or arrhythmia.  He is on eliquis for anticoagulation.  He is on combination rosuvastatin 10 mg and fenofibrate 145 mg for mixed hyperlipidemia.  He continues to be on Proscar 5 mg for his prostate.  He also is on Flomax.  He denies chest pain.  He denies PND or orthopnea.  He presents for evaluation.   Past Medical History:  Diagnosis Date  . Diabetes mellitus without complication ([place])   . Enlarged prostate   . Headache(784.0)    occ.  Marland Kitchen History of asthma    as a child  . Hyperlipidemia   . Hypertension   . Sleep apnea    2013    Past Surgical History:  Procedure Laterality Date  . APPENDECTOMY  1968  . CARDIAC CATHETERIZATION  09/06/1997   normal LV function,  normal coronaries (Dr. Shelva Majestic)  . KNEE ARTHROSCOPY  1990'   with baker's cyst excision unsure side  . NASAL SEPTOPLASTY W/ TURBINOPLASTY Bilateral 05/25/2013   Procedure: NASAL SEPTOPLASTY WITH TURBINATE REDUCTION;  Surgeon: Jodi Marble, MD;  Location: [facility];  Service: ENT;  Laterality: Bilateral;  . NASOPHARYNGOSCOPY  1972   cyst removal  . NM MYOCAR PERF WALL MOTION  03/2006   bruce myoview - mild-mod perfusion defect in basal inferoseptal walls with mild reversibility at rest; mild to mod defect in mid inferior wall with mild reversibility at rest; EF 63%; intermediate risk scan  . TONSILLECTOMY     as a child  . TRANSTHORACIC ECHOCARDIOGRAM  03/2006   EF=>55%, mild conc LVH; borderline RV/RA enlargement; borderline MVP, prolapse of anterior leaflets, mild MR; mild TR; AV mildly sclerotic  . UVULECTOMY N/A 05/25/2013   Procedure: Myrtis Ser ASSISTED;  Surgeon: Jodi Marble, MD;  Location: [facility];  Service: ENT;  Laterality: N/A;  . VENTRAL HERNIA REPAIR     x 2    No Known  Allergies  Current Outpatient Prescriptions  Medication Sig Dispense Refill  . ELIQUIS 5 MG TABS tablet TAKE 1 TABLET(5 MG) BY MOUTH TWICE DAILY 180 tablet 1  . fenofibrate (TRICOR) 145 MG tablet TAKE 1 TABLET BY MOUTH DAILY 90 tablet 0  . finasteride (PROSCAR) 5 MG tablet Take 5 mg by mouth daily.    . metoprolol succinate (TOPROL-XL) 50 MG 24 hr tablet TAKE 1 TABLET BY MOUTH EVERY DAY WITH OR IMMEDIATELY FOLLOWING A MEAL. 90 tablet 0  . Multiple Vitamins-Minerals (MULTIVITAMIN WITH MINERALS) tablet Take 1 tablet by mouth daily.    . rosuvastatin (CRESTOR) 10 MG tablet Take 1 tablet by mouth daily.  3  . tamsulosin (FLOMAX) 0.4 MG CAPS capsule Take 0.4 mg by mouth daily.    Marland Kitchen zolpidem (AMBIEN) 5 MG tablet Take 1 tablet by mouth at bedtime as needed. USED FOR TRAVEL  0   No current facility-administered medications for this visit.     Socially he is married. He has 3 children 4 grandchildren. There is no tobacco use. He did smoke for short-term and quit in 1969. He does drink alcohol.  Family History  Problem Relation Age of Onset  . Kidney failure Mother   . Emphysema Father   . Heart disease Other   . Breast cancer Sister   . Multiple myeloma Sister      ROS General: Negative; No fevers, chills, or night sweats; positive for purposeful 6 pound weight loss HEENT: Negative; No changes in vision or hearing, sinus congestion, difficulty swallowing Pulmonary: Negative; No cough, wheezing, shortness of breath, hemoptysis Cardiovascular: See history of present illness GI: Negative; No nausea, vomiting, diarrhea, or abdominal pain GU: Negative; No dysuria, hematuria, or difficulty voiding Musculoskeletal: Negative; no myalgias, joint pain, or weakness Hematologic/Oncology: Negative; no easy bruising, bleeding Endocrine: Negative; no heat/cold intolerance; he is unaware of polydipsia or polyuria Neuro: Negative; no changes in balance, headaches Skin: Negative; No rashes or skin  lesions Psychiatric: Negative; No behavioral problems, depression Sleep: Positive for sleep apnea, currently not on CPAP, but status post recent ENT surgery; No snoring, daytime sleepiness, hypersomnolence, bruxism, restless legs, hypnogognic hallucinations, no cataplexy Other comprehensive 14 point system review is negative.   PE BP 122/84   Pulse 62   Ht 6' 1"  (1.854 m)   Wt 202 lb 3.2 oz (91.7 kg)   BMI 26.68 kg/m    Repeat blood pressure by me  was 124/78  Wt Readings from Last 3 Encounters:  12/16/16 202 lb 3.2 oz (91.7 kg)  05/01/16 202 lb (91.6 kg)  03/03/16 202 lb (91.6 kg)    General: Alert, oriented, no distress.  Skin: normal turgor, no rashes, warm and dry HEENT: Normocephalic, atraumatic. Pupils equal round and reactive to light; sclera anicteric; extraocular muscles intact;  Nose status post surgery for deviated septum and reduction of inferior turbinates Mouth/Parynx benign; status post laser surgery to the soft palate; Mallinpatti scale 2 Neck: No JVD, no carotid bruits; normal carotid upstroke Lungs: clear to ausculatation and percussion; no wheezing or rales Chest wall: without tenderness to palpitation Heart: PMI not displaced, RRR, s1 s2 normal, 1/6 systolic murmur, no diastolic murmur, no rubs, gallops, thrills, or heaves Abdomen: soft, nontender; no hepatosplenomehaly, BS+; abdominal aorta nontender and not dilated by palpation. Back: no CVA tenderness Pulses 2+ Musculoskeletal: full range of motion, normal strength, no joint deformities Extremities: no clubbing cyanosis or edema, Homan's sign negative  Neurologic: grossly nonfocal; Cranial nerves grossly wnl Psychologic: Normal mood and affect   ECG (independently read by me): sinus bradycardia at 62 bpm.  Premature ventricular contractions.  Normal intervals.  No ST segment changes.  December 2017 ECG (independently read by me): Normal sinus rhythm at 68 bpm.  Rare PAC, occasional PVC.  Nonspecific ST  changes.  April 2017 ECG (independently read by me): Normal sinus rhythm with PAC pattern of atrial bigeminy  September 2016 ECG (independently read by me): Sinus rhythm with PAC's, atrial bigeminy for which he is entirely asymptomatic.  05/10/2014 ECG (independently read by me): Sinus rhythm with sinus arrhythmia and  PACs with an atrial bigeminal rhythm  04/19/2014 ECG (independently read by me): Normal sinus rhythm at 61 bpm.  One isolated PVC.  No significant ST segment changes.  Normal intervals.  Prior ECG (independently read by me) normal sinus rhythm at 64 beats per minute.  Normal intervals.  No ST segment changes.  ECG on 06/23/13 (independently read by me) normal sinus rhythm at 63 beats per minute.  PR interval 150 ms, QTc interval 433 ms  Prior 06/15/2013 ECG (independently read by me): Normal sinus rhythm at 72 beats per minute. PR interval 144 ms; QTc interval 451 ms  LABS:  BMP Latest Ref Rng & Units 02/25/2016 08/15/2015 11/28/2014  Glucose 65 - 99 mg/dL 154(H) 149(H) 130(H)  BUN 7 - 25 mg/dL 21 21 22   Creatinine 0.70 - 1.18 mg/dL 0.94 1.01 0.91  Sodium 135 - 146 mmol/L 139 138 139  Potassium 3.5 - 5.3 mmol/L 4.7 4.7 4.9  Chloride 98 - 110 mmol/L 105 103 105  CO2 20 - 31 mmol/L 27 26 29   Calcium 8.6 - 10.3 mg/dL 9.3 9.4 9.3    Hepatic Function Latest Ref Rng & Units 02/25/2016 08/15/2015 11/28/2014  Total Protein 6.1 - 8.1 g/dL 6.6 6.7 6.8  Albumin 3.6 - 5.1 g/dL 4.2 4.3 4.3  AST 10 - 35 U/L 20 23 19   ALT 9 - 46 U/L 23 27 21   Alk Phosphatase 40 - 115 U/L 32(L) 40 37(L)  Total Bilirubin 0.2 - 1.2 mg/dL 0.5 0.4 0.4    CBC Latest Ref Rng & Units 02/25/2016 08/15/2015 11/28/2014  WBC 3.8 - 10.8 K/uL 6.6 7.6 7.3  Hemoglobin 13.2 - 17.1 g/dL 14.1 14.2 14.1  Hematocrit 38.5 - 50.0 % 42.9 41.7 42.5  Platelets 140 - 400 K/uL 244 259 280   Lab Results  Component Value Date   MCV  90.1 02/25/2016   MCV 89.5 08/15/2015   MCV 89.5 11/28/2014   Lab Results  Component  Value Date   TSH 4.32 02/25/2016   Lab Results  Component Value Date   HGBA1C 6.5 (H) 02/25/2016   No results found for: PROBNP  Lipid Panel     Component Value Date/Time   CHOL 178 02/25/2016 0803   CHOL 152 10/25/2013 0804   TRIG 102 02/25/2016 0803   TRIG 96 10/25/2013 0804   HDL 63 02/25/2016 0803   HDL 55 10/25/2013 0804   CHOLHDL 2.8 02/25/2016 0803   VLDL 20 02/25/2016 0803   LDLCALC 95 02/25/2016 0803   LDLCALC 78 10/25/2013 0804    NMR Profile 10/25/2013   Ref Range 81moago  129mogo      LDL Particle Number <1000 nmol/L 799    Comments: Reference Range  Low < 1000  Moderate 1000 - 1299  Borderline-High 1300 - 1599  High 1600 - 2000  Very High > 2000     LDL (calc) <100 mg/dL 78 72R, CM   Comments: LDL-C is inaccurate if patient is nonfasting. Reference Range  Optimal < 100  Near/Above Optimal 100 - 129  Borderline High 130 - 159  High 160 - 189  Very High >= 190     HDL-C >=40 mg/dL 55     Triglycerides <150 mg/dL 96 398 (H)    Cholesterol, Total <200 mg/dL 152     HDL Particle Number >=30.5 umol/L 37.9     Large HDL-P >=4.8 umol/L 7.6     Large VLDL-P <=2.7 nmol/L 2.5     Small LDL Particle Number <=527 nmol/L 275     LDL Size >20.5 nm 20.9     HDL Size >=9.2 nm 9.0 (L)     VLDL Size <=46.6 nm 41.4     LP-IR Score <=45  37       RADIOLOGY: No results found.   IMPRESSION:  1. PAF (paroxysmal atrial fibrillation) ([place]  2. Metabolic syndrome   3. Essential hypertension, benign   4. Mixed hyperlipidemia   5. Anticoagulation adequate      ASSESSMENT AND PLAN: [name] a [age]ear old gentleman who appears younger than his stated age and had documented normal coronary arteries and normal LV function at cardiac catheterization [id number] a nuclear perfusion study raised the possibility of subtle ischemia. In 2015, he had developed overt diabetes, and also had episodes of paroxysmal atrial fibrillation.   With significant lifestyle adjustment he lost approximately 25.  His blood pressure  normalized and his hemoglobin A1c significantly improved.  He is not aware of any recurrent episodes of PAF and continues to do well with Eliquis anticoagulation. Since I last saw him, he has been successful with additional weight loss.  His hemoglobin A1c is now 6.3.  He is followed by Dr. To, so thick.  He denies any chest pain.  He is unaware of any palpitations.  He is sleeping well and has not had issues with sleep disorder breathing since his ENT surgery.  Will be undergoing additional blood work by his primary physician.  I will last that these be forwarded to me for my review.  I commended him on his weight loss.  He is tolerating eliquis and is without bleeding.  He is not having any arrhythmias.  His blood pressure is stable on metoprolol.  As long as he continues to do well.  I will see him in one  year for reevaluation.  Time spent: 25 minutes. Troy Sine, MD, [facility] 12/16/2016 6:28 PM

## 2016-12-19 DIAGNOSIS — M545 Low back pain: Secondary | ICD-10-CM | POA: Diagnosis not present

## 2016-12-30 DIAGNOSIS — M545 Low back pain: Secondary | ICD-10-CM | POA: Diagnosis not present

## 2017-01-10 ENCOUNTER — Other Ambulatory Visit: Payer: Self-pay | Admitting: Cardiovascular Disease

## 2017-02-09 DIAGNOSIS — M170 Bilateral primary osteoarthritis of knee: Secondary | ICD-10-CM | POA: Diagnosis not present

## 2017-02-09 DIAGNOSIS — Z0000 Encounter for general adult medical examination without abnormal findings: Secondary | ICD-10-CM | POA: Diagnosis not present

## 2017-02-09 DIAGNOSIS — Z125 Encounter for screening for malignant neoplasm of prostate: Secondary | ICD-10-CM | POA: Diagnosis not present

## 2017-02-09 DIAGNOSIS — R82998 Other abnormal findings in urine: Secondary | ICD-10-CM | POA: Diagnosis not present

## 2017-02-09 DIAGNOSIS — E119 Type 2 diabetes mellitus without complications: Secondary | ICD-10-CM | POA: Diagnosis not present

## 2017-02-16 DIAGNOSIS — Z1389 Encounter for screening for other disorder: Secondary | ICD-10-CM | POA: Diagnosis not present

## 2017-02-16 DIAGNOSIS — Z0000 Encounter for general adult medical examination without abnormal findings: Secondary | ICD-10-CM | POA: Diagnosis not present

## 2017-02-16 DIAGNOSIS — Z7901 Long term (current) use of anticoagulants: Secondary | ICD-10-CM | POA: Diagnosis not present

## 2017-02-16 DIAGNOSIS — I480 Paroxysmal atrial fibrillation: Secondary | ICD-10-CM | POA: Diagnosis not present

## 2017-02-16 DIAGNOSIS — I10 Essential (primary) hypertension: Secondary | ICD-10-CM | POA: Diagnosis not present

## 2017-02-16 DIAGNOSIS — Z23 Encounter for immunization: Secondary | ICD-10-CM | POA: Diagnosis not present

## 2017-02-16 DIAGNOSIS — M170 Bilateral primary osteoarthritis of knee: Secondary | ICD-10-CM | POA: Diagnosis not present

## 2017-02-16 DIAGNOSIS — N401 Enlarged prostate with lower urinary tract symptoms: Secondary | ICD-10-CM | POA: Diagnosis not present

## 2017-02-20 DIAGNOSIS — Z1212 Encounter for screening for malignant neoplasm of rectum: Secondary | ICD-10-CM | POA: Diagnosis not present

## 2017-02-25 ENCOUNTER — Other Ambulatory Visit: Payer: Self-pay | Admitting: Cardiovascular Disease

## 2017-02-25 DIAGNOSIS — R972 Elevated prostate specific antigen [PSA]: Secondary | ICD-10-CM | POA: Diagnosis not present

## 2017-02-25 NOTE — Telephone Encounter (Signed)
REFILL 

## 2017-02-26 DIAGNOSIS — M170 Bilateral primary osteoarthritis of knee: Secondary | ICD-10-CM | POA: Diagnosis not present

## 2017-03-25 DIAGNOSIS — H35362 Drusen (degenerative) of macula, left eye: Secondary | ICD-10-CM | POA: Diagnosis not present

## 2017-03-25 DIAGNOSIS — H2513 Age-related nuclear cataract, bilateral: Secondary | ICD-10-CM | POA: Diagnosis not present

## 2017-03-25 DIAGNOSIS — H43811 Vitreous degeneration, right eye: Secondary | ICD-10-CM | POA: Diagnosis not present

## 2017-03-25 DIAGNOSIS — H35371 Puckering of macula, right eye: Secondary | ICD-10-CM | POA: Diagnosis not present

## 2017-03-27 DIAGNOSIS — M79672 Pain in left foot: Secondary | ICD-10-CM | POA: Diagnosis not present

## 2017-06-09 DIAGNOSIS — D485 Neoplasm of uncertain behavior of skin: Secondary | ICD-10-CM | POA: Diagnosis not present

## 2017-06-09 DIAGNOSIS — L570 Actinic keratosis: Secondary | ICD-10-CM | POA: Diagnosis not present

## 2017-06-09 DIAGNOSIS — L821 Other seborrheic keratosis: Secondary | ICD-10-CM | POA: Diagnosis not present

## 2017-06-09 DIAGNOSIS — L111 Transient acantholytic dermatosis [Grover]: Secondary | ICD-10-CM | POA: Diagnosis not present

## 2017-06-09 DIAGNOSIS — Z85828 Personal history of other malignant neoplasm of skin: Secondary | ICD-10-CM | POA: Diagnosis not present

## 2017-06-09 DIAGNOSIS — D1801 Hemangioma of skin and subcutaneous tissue: Secondary | ICD-10-CM | POA: Diagnosis not present

## 2017-06-09 DIAGNOSIS — L820 Inflamed seborrheic keratosis: Secondary | ICD-10-CM | POA: Diagnosis not present

## 2017-08-31 ENCOUNTER — Other Ambulatory Visit: Payer: Self-pay | Admitting: Cardiovascular Disease

## 2017-09-02 DIAGNOSIS — M1712 Unilateral primary osteoarthritis, left knee: Secondary | ICD-10-CM | POA: Diagnosis not present

## 2017-09-02 DIAGNOSIS — M1711 Unilateral primary osteoarthritis, right knee: Secondary | ICD-10-CM | POA: Diagnosis not present

## 2017-09-09 DIAGNOSIS — M170 Bilateral primary osteoarthritis of knee: Secondary | ICD-10-CM | POA: Diagnosis not present

## 2017-09-18 DIAGNOSIS — Z6828 Body mass index (BMI) 28.0-28.9, adult: Secondary | ICD-10-CM | POA: Diagnosis not present

## 2017-09-18 DIAGNOSIS — M170 Bilateral primary osteoarthritis of knee: Secondary | ICD-10-CM | POA: Diagnosis not present

## 2017-09-18 DIAGNOSIS — R42 Dizziness and giddiness: Secondary | ICD-10-CM | POA: Diagnosis not present

## 2017-09-29 DIAGNOSIS — Z7901 Long term (current) use of anticoagulants: Secondary | ICD-10-CM | POA: Diagnosis not present

## 2017-09-29 DIAGNOSIS — E119 Type 2 diabetes mellitus without complications: Secondary | ICD-10-CM | POA: Diagnosis not present

## 2017-09-29 DIAGNOSIS — I10 Essential (primary) hypertension: Secondary | ICD-10-CM | POA: Diagnosis not present

## 2017-09-29 DIAGNOSIS — G4733 Obstructive sleep apnea (adult) (pediatric): Secondary | ICD-10-CM | POA: Diagnosis not present

## 2017-09-29 DIAGNOSIS — R946 Abnormal results of thyroid function studies: Secondary | ICD-10-CM | POA: Diagnosis not present

## 2017-09-29 DIAGNOSIS — G4709 Other insomnia: Secondary | ICD-10-CM | POA: Diagnosis not present

## 2017-12-16 DIAGNOSIS — S80861A Insect bite (nonvenomous), right lower leg, initial encounter: Secondary | ICD-10-CM | POA: Diagnosis not present

## 2017-12-16 DIAGNOSIS — S80862A Insect bite (nonvenomous), left lower leg, initial encounter: Secondary | ICD-10-CM | POA: Diagnosis not present

## 2017-12-22 DIAGNOSIS — Z85828 Personal history of other malignant neoplasm of skin: Secondary | ICD-10-CM | POA: Diagnosis not present

## 2017-12-22 DIAGNOSIS — D0462 Carcinoma in situ of skin of left upper limb, including shoulder: Secondary | ICD-10-CM | POA: Diagnosis not present

## 2017-12-22 DIAGNOSIS — D1801 Hemangioma of skin and subcutaneous tissue: Secondary | ICD-10-CM | POA: Diagnosis not present

## 2017-12-22 DIAGNOSIS — L570 Actinic keratosis: Secondary | ICD-10-CM | POA: Diagnosis not present

## 2017-12-22 DIAGNOSIS — L821 Other seborrheic keratosis: Secondary | ICD-10-CM | POA: Diagnosis not present

## 2017-12-23 DIAGNOSIS — H25813 Combined forms of age-related cataract, bilateral: Secondary | ICD-10-CM | POA: Diagnosis not present

## 2018-01-08 ENCOUNTER — Telehealth: Payer: Self-pay | Admitting: Cardiovascular Disease

## 2018-01-08 DIAGNOSIS — Z79899 Other long term (current) drug therapy: Secondary | ICD-10-CM

## 2018-01-08 NOTE — Telephone Encounter (Signed)
New Message        Patient would like to know if he is suppose to do labs before his yearly?

## 2018-01-08 NOTE — Telephone Encounter (Signed)
Return call to pt. Pt reports he normally have lab work done before his yearly appointment and inquiring if that need to be done. Informed I will route message to MD and Nurse.

## 2018-01-11 NOTE — Telephone Encounter (Signed)
Left message to call back  

## 2018-01-11 NOTE — Telephone Encounter (Signed)
Okay to order routine fasting labs would also include hemoglobin A1c

## 2018-01-11 NOTE — Telephone Encounter (Signed)
Return call to pt advised that Dr. Tresa Endo stated it ok for routine labs. Order placed.

## 2018-01-12 DIAGNOSIS — Z79899 Other long term (current) drug therapy: Secondary | ICD-10-CM | POA: Diagnosis not present

## 2018-01-13 LAB — COMPREHENSIVE METABOLIC PANEL
ALBUMIN: 4.6 g/dL (ref 3.5–4.8)
ALK PHOS: 34 IU/L — AB (ref 39–117)
ALT: 22 IU/L (ref 0–44)
AST: 24 IU/L (ref 0–40)
Albumin/Globulin Ratio: 2.2 (ref 1.2–2.2)
BUN/Creatinine Ratio: 20 (ref 10–24)
BUN: 19 mg/dL (ref 8–27)
Bilirubin Total: 0.4 mg/dL (ref 0.0–1.2)
CO2: 23 mmol/L (ref 20–29)
Calcium: 9.6 mg/dL (ref 8.6–10.2)
Chloride: 102 mmol/L (ref 96–106)
Creatinine, Ser: 0.96 mg/dL (ref 0.76–1.27)
GFR calc Af Amer: 88 mL/min/{1.73_m2} (ref >59)
GFR calc non Af Amer: 76 mL/min/{1.73_m2} (ref >59)
GLUCOSE: 141 mg/dL — AB (ref 65–99)
Globulin, Total: 2.1 g/dL (ref 1.5–4.5)
Potassium: 4.9 mmol/L (ref 3.5–5.2)
Sodium: 140 mmol/L (ref 134–144)
Total Protein: 6.7 g/dL (ref 6.0–8.5)

## 2018-01-13 LAB — LIPID PANEL
CHOLESTEROL TOTAL: 142 mg/dL (ref 100–199)
Chol/HDL Ratio: 2.2 ratio (ref 0.0–5.0)
HDL: 64 mg/dL (ref >39)
LDL Calculated: 59 mg/dL (ref 0–99)
Triglycerides: 94 mg/dL (ref 0–149)
VLDL Cholesterol Cal: 19 mg/dL (ref 5–40)

## 2018-01-13 LAB — CBC
HEMOGLOBIN: 13.7 g/dL (ref 13.0–17.7)
Hematocrit: 41.6 % (ref 37.5–51.0)
MCH: 29.8 pg (ref 26.6–33.0)
MCHC: 32.9 g/dL (ref 31.5–35.7)
MCV: 90 fL (ref 79–97)
Platelets: 231 10*3/uL (ref 150–450)
RBC: 4.6 x10E6/uL (ref 4.14–5.80)
RDW: 12.7 % (ref 12.3–15.4)
WBC: 7.1 10*3/uL (ref 3.4–10.8)

## 2018-01-13 LAB — HEMOGLOBIN A1C
Est. average glucose Bld gHb Est-mCnc: 146 mg/dL
HEMOGLOBIN A1C: 6.7 % — AB (ref 4.8–5.6)

## 2018-01-13 LAB — TSH: TSH: 5.23 u[IU]/mL — AB (ref 0.450–4.500)

## 2018-01-14 ENCOUNTER — Telehealth: Payer: Self-pay | Admitting: *Deleted

## 2018-01-14 ENCOUNTER — Ambulatory Visit: Payer: BLUE CROSS/BLUE SHIELD | Admitting: Cardiovascular Disease

## 2018-01-14 ENCOUNTER — Other Ambulatory Visit: Payer: Self-pay | Admitting: Cardiovascular Disease

## 2018-01-14 ENCOUNTER — Encounter: Payer: Self-pay | Admitting: Cardiovascular Disease

## 2018-01-14 VITALS — BP 127/69 | HR 60 | Ht 73.0 in | Wt 221.8 lb

## 2018-01-14 DIAGNOSIS — E8881 Metabolic syndrome: Secondary | ICD-10-CM

## 2018-01-14 DIAGNOSIS — Z7901 Long term (current) use of anticoagulants: Secondary | ICD-10-CM

## 2018-01-14 DIAGNOSIS — R7309 Other abnormal glucose: Secondary | ICD-10-CM

## 2018-01-14 DIAGNOSIS — Z86010 Personal history of colonic polyps: Secondary | ICD-10-CM | POA: Diagnosis not present

## 2018-01-14 DIAGNOSIS — E782 Mixed hyperlipidemia: Secondary | ICD-10-CM

## 2018-01-14 DIAGNOSIS — I10 Essential (primary) hypertension: Secondary | ICD-10-CM

## 2018-01-14 DIAGNOSIS — I480 Paroxysmal atrial fibrillation: Secondary | ICD-10-CM | POA: Diagnosis not present

## 2018-01-14 NOTE — Patient Instructions (Signed)
Medication Instructions:  Your physician recommends that you continue on your current medications as directed. Please refer to the Current Medication list given to you today.  If you need a refill on your cardiac medications before your next appointment, please call your pharmacy.   Follow-Up: At [facility], you and your health needs are our priority.  As part of our continuing mission to provide you with exceptional heart care, we have created designated Provider Care Teams.  These Care Teams include your primary Cardiologist (physician) and Advanced Practice Providers (APPs -  Physician Assistants and Nurse Practitioners) who all work together to provide you with the care you need, when you need it. You will need a follow up appointment in 12 months.  Please call our office 2 months in advance to schedule this appointment.  You may see Dr. Kelly or one of the following Advanced Practice Providers on your designated Care Team: Hao Meng, PA-C . Angela Duke, PA-C  

## 2018-01-14 NOTE — Progress Notes (Signed)
Patient ID: Victor Sanchez, male   DOB: [birth year], 77 y.o.   MRN: [id number]    Primary MD: Dr. Osborne Casco  HPI: Mr. Mervil Wacker is a 77 year old white male who presents for a 12 month follow-up cardiology evaluation.  Mr. Bubolz developed  atypical chest pain in 1999. A nuclear perfusion study raised the possibility of focal inferior scar with possible inferolateral redistribution. Cardiac catheterization revealed normal LV function and normal coronary arteries. In 2008 a nuclear study showed diaphragmatic attenuation although the ormal interpretation was interpreted as possible ischemia. In January 2015 he developed URI symptoms and had  significant difficulty with sleep. He also had significant deviated septum, and has had episodes with paroxysmal positional vertigo. As part of his preoperative evaluation for ENT surgery he was found to have possible abnormal heart rhythms. He underwent his ENT surgery for with a nasal septoplasty, reduction of his inferior turbinates, and LAUP. When Dr. Erik Obey saw him  he was concerned about the possibility of atrial fibrillation.  He reestablished care with me last year following this episode.  At that time prior to a planned trip to [place] for 2 weeks I recommended that he undergo laboratory as well as wear a CardioNet monitor.  Blood work was abnormal in that his total cholesterol was 203, triglycerides 398, HDL 51, VLDL 80 LDL 72.  Magnesium was normal.  His fasting glucose was 165 and the hemoglobin A1c was elevated at 8.0 indicative of a mean plasma glucose of approximately 183.  He had normal hemoglobin and hematocrit.  A 2-D echo Doppler study revealed an ejection fraction in the 55-60%.  On his monitor, he developed recurrent episodes of paroxysmal atrial fibrillation with rates in the low 100s, but in one instance  he developed a 10 beat run of PSVT approximately 178 beats per minute.  He was contacted regarding these results and was brought into the office on  06/23/2013.  An echo Doppler  revealed mild aortic valve sclerosis, mild tricuspid regurgitation, and mild MR, with normal systolic function.  His laboratory was suggestive of type 2 diabetes mellitus.  With his episodes of PAF noted on his monitor.  I recommended institution of low-dose Toprol initially at 25 mg and titrate this to 50 mg.  I also extended his cardiac monitor beyond the initial 10 days.  I recommended the addition of fenofibrate 145 mg in light of his significant triglyceride elevation and also suggested initiation of metformin 500 mg daily with plans for primary care followup.  The subsequent CardioNet monitor did not demonstrate any further episodes of PAF on his increased beta blocker therapy although he did have some sinus tachycardia during activity.  Repeat blood work was significantly improved with his fasting glucose improved from 165, and remotely, 202.  Liver function studies were normal.  Renal function was normal.  In August 2015 he had continued to be very dedicated both with exercise, as well as improved diet.  He was walking 3-4 miles per day and lost approximately 25 pounds.  He was unaware of any arrhythmia.  He denies chest pain or shortness of breath.  Followup blood work revealed marked improvement in his hemoglobin A1c from 8.0-6.1.  His lipid studies were markedly improved with triglycerides being reduced to 96 from 398.  An NMR lipoprotein showed an LDL particle number was excellent at 799, with calculated LDL 78.  HDL particle number was 37.9.  He was no longer in resistant with an insulin resistance score 37.  When I saw him  in February 2016 he complained that his chest would quiver on the left and he would notice some occasional pounding from within.  He was uncertain if he was having potential breakthrough atrial fibrillation or for these were just muscle spasms.  They have been occurring daily.  The episodes would last 5-6 seconds and then resolve.  He denied  associated presyncope or syncope or nocturnal symptoms.  At that time, I felt most likely this symptomatology was due to pectoralis muscle fasciculations rather than recurrent AF.  He wore a 2 week event monitor from 04/19/2014 through 05/02/2014.  This did not demonstrate any significant arrhythmia, but showed predominantly sinus rhythm with sinus bradycardia and rare PACs and rare isolated PVCs.  Assessment laboratory was also done which I reviewed with him in detail today.  His hemoglobin and hematocrit were stable.  Fasting glucose was mildly increased at 126.  Normal renal function and electrolytes.  TSH was borderline increased at 4.7.  His studies were normal.  Lipid studies continue to be excellent with a total cholesterol of 171, triglycerides 90, HDL 57, and LDL 96, on his current therapy.    When I saw him in September 2016, he was doing  well.  He went to [place] and [place].   Later he underwent arthroscopic knee surgery which took him 3-4 months to recover.  As result, his exercise has been reduced.  He is unaware of any recurrent arrhythmia.  In September 2016 LDL cholesterol was 88.  Total cholesterol 160, triglycerides 102.   Blood work from May 2017 had shown an increase in his hemoglobin A1c to 7.0.  Over the past 6 months, he has made a conscious effort to have improved diet.  He is exercising regularly.  He denies chest pain.  He denies palpitations.  He underwent repeat blood work last week which showed a normal CBC.  Hemoglobin A1c has improved to 6.5.  Total cholesterol was 178, triglycerides 102, his LDL was 95.  He has continued to be on fenofibrate 145 mg and pravastatin 40 mg.    I last saw him in October 2018 at which time he was remaining stable.  He was active and exercise regularly.  He had been successful with weight loss and his weight is down to 202 pounds. He traveled to [place] and the [employer].  He has had some issues with his left knee and was contemplating  surgery but ultimately this was aborted.  He is unaware of any recurrent atrial fibrillation.   Dr. Rosana Hoes checked his blood work and his hemoglobin A1c was 6.3.  He was on Toprol-XL 50 mg with control of palpitations or arrhythmia; eliquis for anticoagulation.;combination rosuvastatin 10 mg and fenofibrate 145 mg for mixed hyperlipidemia.  He was on Proscar 5 mg for his prostate and Flomax.    Over the past year, he continues to remain stable from a cardiac standpoint.  He recently returned from a three-week trip to [place] in the a geriatric C.  He gained 5 pounds during the excursion.  He denied any chest pain or palpitations.  Repeat laboratory from January 08, 2018 showed total cholesterol 142, triglycerides 94, HDL 64, LDL 59.  Hemoglobin hematocrit were stable.  Glucose was increased at 141.  He had normal renal function and LFTs.  TSH was minimally increased at 5.2.  Hemoglobin A1c was slightly elevated at 6.7.  He will be seeing Dr. Osborne Casco in January.  He is unaware of  recurrent atrial fibrillation, PND orthopnea or shortness of breath.  He presents for yearly evaluation.  Past Medical History:  Diagnosis Date  . Diabetes mellitus without complication ([place])   . Enlarged prostate   . Headache(784.0)    occ.  Marland Kitchen History of asthma    as a child  . Hyperlipidemia   . Hypertension   . Sleep apnea    2013    Past Surgical History:  Procedure Laterality Date  . APPENDECTOMY  1968  . CARDIAC CATHETERIZATION  09/06/1997   normal LV function, normal coronaries (Dr. Shelva Majestic)  . KNEE ARTHROSCOPY  1990'   with baker's cyst excision unsure side  . NASAL SEPTOPLASTY W/ TURBINOPLASTY Bilateral 05/25/2013   Procedure: NASAL SEPTOPLASTY WITH TURBINATE REDUCTION;  Surgeon: Jodi Marble, MD;  Location: [facility];  Service: ENT;  Laterality: Bilateral;  . NASOPHARYNGOSCOPY  1972   cyst removal  . NM MYOCAR PERF WALL MOTION  03/2006   bruce myoview - mild-mod perfusion defect in basal inferoseptal  walls with mild reversibility at rest; mild to mod defect in mid inferior wall with mild reversibility at rest; EF 63%; intermediate risk scan  . TONSILLECTOMY     as a child  . TRANSTHORACIC ECHOCARDIOGRAM  03/2006   EF=>55%, mild conc LVH; borderline RV/RA enlargement; borderline MVP, prolapse of anterior leaflets, mild MR; mild TR; AV mildly sclerotic  . UVULECTOMY N/A 05/25/2013   Procedure: Myrtis Ser ASSISTED;  Surgeon: Jodi Marble, MD;  Location: [facility];  Service: ENT;  Laterality: N/A;  . VENTRAL HERNIA REPAIR     x 2    No Known Allergies  Current Outpatient Medications  Medication Sig Dispense Refill  . ELIQUIS 5 MG TABS tablet TAKE 1 TABLET(5 MG) BY MOUTH TWICE DAILY 180 tablet 0  . finasteride (PROSCAR) 5 MG tablet Take 5 mg by mouth daily.    . Multiple Vitamins-Minerals (MULTIVITAMIN WITH MINERALS) tablet Take 1 tablet by mouth daily.    . rosuvastatin (CRESTOR) 10 MG tablet Take 1 tablet by mouth daily.  3  . rosuvastatin (CRESTOR) 10 MG tablet TAKE 1 TABLET(10 MG) BY MOUTH DAILY 90 tablet 1  . tamsulosin (FLOMAX) 0.4 MG CAPS capsule Take 0.4 mg by mouth daily.    Marland Kitchen zolpidem (AMBIEN) 5 MG tablet Take 1 tablet by mouth at bedtime as needed. USED FOR TRAVEL  0  . fenofibrate (TRICOR) 145 MG tablet TAKE 1 TABLET BY MOUTH DAILY 90 tablet 3  . metoprolol succinate (TOPROL-XL) 50 MG 24 hr tablet TAKE 1 TABLET BY MOUTH EVERY DAY WITH OR IMMEDIATELY FOLLOWING A MEAL. 90 tablet 3   No current facility-administered medications for this visit.     Socially he is married. He has 3 children 4 grandchildren. There is no tobacco use. He did smoke for short-term and quit in 1969. He does drink alcohol.  Family History  Problem Relation Age of Onset  . Kidney failure Mother   . Emphysema Father   . Heart disease Other   . Breast cancer Sister   . Multiple myeloma Sister      ROS General: Negative; No fevers, chills, or night sweats; positive for purposeful 6 pound weight  loss HEENT: Negative; No changes in vision or hearing, sinus congestion, difficulty swallowing Pulmonary: Negative; No cough, wheezing, shortness of breath, hemoptysis Cardiovascular: See history of present illness GI: Negative; No nausea, vomiting, diarrhea, or abdominal pain GU: Negative; No dysuria, hematuria, or difficulty voiding Musculoskeletal: Negative; no myalgias, joint pain, or  weakness Hematologic/Oncology: Negative; no easy bruising, bleeding Endocrine: Negative; no heat/cold intolerance; he is unaware of polydipsia or polyuria Neuro: Negative; no changes in balance, headaches Skin: Negative; No rashes or skin lesions Psychiatric: Negative; No behavioral problems, depression Sleep: Positive for sleep apnea, currently not on CPAP, but status post recent ENT surgery; No snoring, daytime sleepiness, hypersomnolence, bruxism, restless legs, hypnogognic hallucinations, no cataplexy Other comprehensive 14 point system review is negative.   PE BP 127/69   Pulse 60   Ht 6' 1"  (1.854 m)   Wt 221 lb 12.8 oz (100.6 kg)   BMI 29.26 kg/m    Repeat blood pressure by me 124/70  Wt Readings from Last 3 Encounters:  01/14/18 221 lb 12.8 oz (100.6 kg)  12/16/16 202 lb 3.2 oz (91.7 kg)  05/01/16 202 lb (91.6 kg)   General: Alert, oriented, no distress.  Skin: normal turgor, no rashes, warm and dry HEENT: Normocephalic, atraumatic. Pupils equal round and reactive to light; sclera anicteric; extraocular muscles intact; Nose without nasal septal hypertrophy Mouth/Parynx benign; Mallinpatti scale 3 Neck: No JVD, no carotid bruits; normal carotid upstroke Lungs: clear to ausculatation and percussion; no wheezing or rales Chest wall: without tenderness to palpitation Heart: PMI not displaced, RRR, s1 s2 normal, 1/6 systolic murmur, no diastolic murmur, no rubs, gallops, thrills, or heaves Abdomen: soft, nontender; no hepatosplenomehaly, BS+; abdominal aorta nontender and not dilated by  palpation. Back: no CVA tenderness Pulses 2+ Musculoskeletal: full range of motion, normal strength, no joint deformities Extremities: no clubbing cyanosis or edema, Homan's sign negative  Neurologic: grossly nonfocal; Cranial nerves grossly wnl Psychologic: Normal mood and affect   ECG (independently read by me): Normal sinus rhythm at 60 bpm.  PAC.  Nonspecific ST changes.  October 2018 ECG (independently read by me): sinus bradycardia at 62 bpm.  Premature ventricular contractions.  Normal intervals.  No ST segment changes.  December 2017 ECG (independently read by me): Normal sinus rhythm at 68 bpm.  Rare PAC, occasional PVC.  Nonspecific ST changes.  April 2017 ECG (independently read by me): Normal sinus rhythm with PAC pattern of atrial bigeminy  September 2016 ECG (independently read by me): Sinus rhythm with PAC's, atrial bigeminy for which he is entirely asymptomatic.  05/10/2014 ECG (independently read by me): Sinus rhythm with sinus arrhythmia and  PACs with an atrial bigeminal rhythm  04/19/2014 ECG (independently read by me): Normal sinus rhythm at 61 bpm.  One isolated PVC.  No significant ST segment changes.  Normal intervals.  Prior ECG (independently read by me) normal sinus rhythm at 64 beats per minute.  Normal intervals.  No ST segment changes.  ECG on 06/23/13 (independently read by me) normal sinus rhythm at 63 beats per minute.  PR interval 150 ms, QTc interval 433 ms  Prior 06/15/2013 ECG (independently read by me): Normal sinus rhythm at 72 beats per minute. PR interval 144 ms; QTc interval 451 ms  LABS:  BMP Latest Ref Rng & Units 01/12/2018 02/25/2016 08/15/2015  Glucose 65 - 99 mg/dL 141(H) 154(H) 149(H)  BUN 8 - 27 mg/dL 19 21 21   Creatinine 0.76 - 1.27 mg/dL 0.96 0.94 1.01  BUN/Creat Ratio 10 - 24 20 - -  Sodium 134 - 144 mmol/L 140 139 138  Potassium 3.5 - 5.2 mmol/L 4.9 4.7 4.7  Chloride 96 - 106 mmol/L 102 105 103  CO2 20 - 29 mmol/L 23 27 26     Calcium 8.6 - 10.2 mg/dL 9.6 9.3 9.4  Hepatic Function Latest Ref Rng & Units 01/12/2018 02/25/2016 08/15/2015  Total Protein 6.0 - 8.5 g/dL 6.7 6.6 6.7  Albumin 3.5 - 4.8 g/dL 4.6 4.2 4.3  AST 0 - 40 IU/L 24 20 23   ALT 0 - 44 IU/L 22 23 27   Alk Phosphatase 39 - 117 IU/L 34(L) 32(L) 40  Total Bilirubin 0.0 - 1.2 mg/dL 0.4 0.5 0.4    CBC Latest Ref Rng & Units 01/12/2018 02/25/2016 08/15/2015  WBC 3.4 - 10.8 x10E3/uL 7.1 6.6 7.6  Hemoglobin 13.0 - 17.7 g/dL 13.7 14.1 14.2  Hematocrit 37.5 - 51.0 % 41.6 42.9 41.7  Platelets 150 - 450 x10E3/uL 231 244 259   Lab Results  Component Value Date   MCV 90 01/12/2018   MCV 90.1 02/25/2016   MCV 89.5 08/15/2015   Lab Results  Component Value Date   TSH 5.230 (H) 01/12/2018   Lab Results  Component Value Date   HGBA1C 6.7 (H) 01/12/2018   No results found for: PROBNP  Lipid Panel     Component Value Date/Time   CHOL 142 01/12/2018 0815   CHOL 152 10/25/2013 0804   TRIG 94 01/12/2018 0815   TRIG 96 10/25/2013 0804   HDL 64 01/12/2018 0815   HDL 55 10/25/2013 0804   CHOLHDL 2.2 01/12/2018 0815   CHOLHDL 2.8 02/25/2016 0803   VLDL 20 02/25/2016 0803   LDLCALC 59 01/12/2018 0815   LDLCALC 78 10/25/2013 0804    NMR Profile 10/25/2013   Ref Range 56moago  116mogo      LDL Particle Number <1000 nmol/L 799    Comments: Reference Range  Low < 1000  Moderate 1000 - 1299  Borderline-High 1300 - 1599  High 1600 - 2000  Very High > 2000     LDL (calc) <100 mg/dL 78 72R, CM   Comments: LDL-C is inaccurate if patient is nonfasting. Reference Range  Optimal < 100  Near/Above Optimal 100 - 129  Borderline High 130 - 159  High 160 - 189  Very High >= 190     HDL-C >=40 mg/dL 55     Triglycerides <150 mg/dL 96 398 (H)    Cholesterol, Total <200 mg/dL 152     HDL Particle Number >=30.5 umol/L 37.9     Large HDL-P >=4.8 umol/L 7.6     Large VLDL-P <=2.7 nmol/L 2.5     Small LDL Particle Number <=527  nmol/L 275     LDL Size >20.5 nm 20.9     HDL Size >=9.2 nm 9.0 (L)     VLDL Size <=46.6 nm 41.4     LP-IR Score <=45  37       RADIOLOGY: No results found.   IMPRESSION:  1. Essential hypertension, benign   2. Mixed hyperlipidemia   3. PAF (paroxysmal atrial fibrillation) ([place]  4. Anticoagulation adequate   5. Metabolic syndrome   6. Elevated hemoglobin A1c     ASSESSMENT AND PLAN: Mr. JoIsaiahs Chancys a [age]ar-old gentleman who appears younger than his stated age and had documented normal coronary arteries and normal LV function at cardiac catheterization [phone] a nuclear perfusion study raised the possibility of subtle ischemia. In 2015, he had developed overt diabetes, and also had episodes of paroxysmal atrial fibrillation.  With significant lifestyle adjustment he lost approximately 25.  His blood pressure  normalized and his hemoglobin A1c significantly improved.  Since I last saw him, he has remained cardiovascular stable.  He had  recently returned from a three-week trip to [place] and also tore into [place].  He admits to a 5 pound weight gain laboratory drawn shortly after his return has continued to show excellent lipid studies.  However, hemoglobin A1c has increased to 6.7.  He is maintaining sinus rhythm and has been without recurrent atrial fibrillation.  He continues to be on Eliquis 5 mg twice a day without bleeding.  He is on combination therapy with rosuvastatin 10 mg and fenofibrate 145 mg for his previous mixed hyperlipidemia.  He is sleeping well and has not utilize CPAP therapy.  He uses Flomax for improved urinary flow.  We discussed overt diabetes is associated with hemoglobin A1c greater than or equal to 6.5.  His laboratory will be repeated in 3 months by Dr. Osborne Casco which hopefully will be improved after more time has elapsed from his recent three-week excursion.  He continues to exercise regularly and was without chest discomfort or exertional dyspnea or  awareness of arrhythmia.  I will see him in 1 year for evaluation.  Time spent: 25 minutes Troy Sine, MD, [facility] 01/16/2018 12:31 PM

## 2018-01-14 NOTE — Telephone Encounter (Signed)
Rx has been sent to the pharmacy electronically. ° °

## 2018-01-14 NOTE — Telephone Encounter (Signed)
   [facility] Pre-operative Risk Assessment    Request for surgical clearance:  1. What type of surgery is being performed? COLONOSCOPY    2. When is this surgery scheduled? 03/03/18   3. What type of clearance is required (medical clearance vs. Pharmacy clearance to hold med vs. Both)? MEDICATION   4. Are there any medications that need to be held prior to surgery and how long?ELIQUIS    5. Practice name and name of physician performing surgery? DR [facility]   6. What is your office phone [phone]     7.   What is your office fax [phone]   8.   Anesthesia type (None, local, MAC, general) ?

## 2018-01-15 NOTE — Telephone Encounter (Signed)
Pt takes Eliquis for afib with CHADS2VASc score of 4 (age x2, HTN, DM). Renal function is normal. Pt was seen by Dr Tresa Endo in office yesterday, note not completed yet. Would recommend waiting until his most recent note can be reviewed to ensure no new diagnoses or recommendations. Otherwise, ok to hold Eliquis for 1-2 days prior to procedure.

## 2018-01-16 ENCOUNTER — Encounter: Payer: Self-pay | Admitting: Cardiovascular Disease

## 2018-03-01 ENCOUNTER — Other Ambulatory Visit: Payer: Self-pay | Admitting: Cardiovascular Disease

## 2018-03-12 DIAGNOSIS — R972 Elevated prostate specific antigen [PSA]: Secondary | ICD-10-CM | POA: Diagnosis not present

## 2018-03-12 DIAGNOSIS — R351 Nocturia: Secondary | ICD-10-CM | POA: Diagnosis not present

## 2018-03-12 DIAGNOSIS — N401 Enlarged prostate with lower urinary tract symptoms: Secondary | ICD-10-CM | POA: Diagnosis not present

## 2018-03-22 ENCOUNTER — Other Ambulatory Visit: Payer: Self-pay | Admitting: Cardiovascular Disease

## 2018-04-08 DIAGNOSIS — L905 Scar conditions and fibrosis of skin: Secondary | ICD-10-CM | POA: Diagnosis not present

## 2018-04-08 DIAGNOSIS — C44729 Squamous cell carcinoma of skin of left lower limb, including hip: Secondary | ICD-10-CM | POA: Diagnosis not present

## 2018-04-21 DIAGNOSIS — E7849 Other hyperlipidemia: Secondary | ICD-10-CM | POA: Diagnosis not present

## 2018-04-21 DIAGNOSIS — Z0000 Encounter for general adult medical examination without abnormal findings: Secondary | ICD-10-CM | POA: Diagnosis not present

## 2018-04-21 DIAGNOSIS — R82998 Other abnormal findings in urine: Secondary | ICD-10-CM | POA: Diagnosis not present

## 2018-04-21 DIAGNOSIS — E119 Type 2 diabetes mellitus without complications: Secondary | ICD-10-CM | POA: Diagnosis not present

## 2018-04-21 DIAGNOSIS — Z125 Encounter for screening for malignant neoplasm of prostate: Secondary | ICD-10-CM | POA: Diagnosis not present

## 2018-04-27 DIAGNOSIS — Z86010 Personal history of colonic polyps: Secondary | ICD-10-CM | POA: Diagnosis not present

## 2018-04-27 DIAGNOSIS — D122 Benign neoplasm of ascending colon: Secondary | ICD-10-CM | POA: Diagnosis not present

## 2018-04-27 DIAGNOSIS — K5730 Diverticulosis of large intestine without perforation or abscess without bleeding: Secondary | ICD-10-CM | POA: Diagnosis not present

## 2018-04-27 DIAGNOSIS — D125 Benign neoplasm of sigmoid colon: Secondary | ICD-10-CM | POA: Diagnosis not present

## 2018-04-27 DIAGNOSIS — K648 Other hemorrhoids: Secondary | ICD-10-CM | POA: Diagnosis not present

## 2018-04-27 DIAGNOSIS — D120 Benign neoplasm of cecum: Secondary | ICD-10-CM | POA: Diagnosis not present

## 2018-04-29 DIAGNOSIS — I480 Paroxysmal atrial fibrillation: Secondary | ICD-10-CM | POA: Diagnosis not present

## 2018-04-29 DIAGNOSIS — E119 Type 2 diabetes mellitus without complications: Secondary | ICD-10-CM | POA: Diagnosis not present

## 2018-04-29 DIAGNOSIS — Z1331 Encounter for screening for depression: Secondary | ICD-10-CM | POA: Diagnosis not present

## 2018-04-29 DIAGNOSIS — M170 Bilateral primary osteoarthritis of knee: Secondary | ICD-10-CM | POA: Diagnosis not present

## 2018-04-29 DIAGNOSIS — M1712 Unilateral primary osteoarthritis, left knee: Secondary | ICD-10-CM | POA: Diagnosis not present

## 2018-04-29 DIAGNOSIS — I10 Essential (primary) hypertension: Secondary | ICD-10-CM | POA: Diagnosis not present

## 2018-04-29 DIAGNOSIS — E7849 Other hyperlipidemia: Secondary | ICD-10-CM | POA: Diagnosis not present

## 2018-04-29 DIAGNOSIS — Z0000 Encounter for general adult medical examination without abnormal findings: Secondary | ICD-10-CM | POA: Diagnosis not present

## 2018-04-30 DIAGNOSIS — D120 Benign neoplasm of cecum: Secondary | ICD-10-CM | POA: Diagnosis not present

## 2018-04-30 DIAGNOSIS — D125 Benign neoplasm of sigmoid colon: Secondary | ICD-10-CM | POA: Diagnosis not present

## 2018-04-30 DIAGNOSIS — D122 Benign neoplasm of ascending colon: Secondary | ICD-10-CM | POA: Diagnosis not present

## 2018-05-03 ENCOUNTER — Other Ambulatory Visit: Payer: Self-pay | Admitting: Internal Medicine

## 2018-05-03 DIAGNOSIS — Z0000 Encounter for general adult medical examination without abnormal findings: Secondary | ICD-10-CM

## 2018-05-03 DIAGNOSIS — Z87891 Personal history of nicotine dependence: Secondary | ICD-10-CM

## 2018-05-06 DIAGNOSIS — M1711 Unilateral primary osteoarthritis, right knee: Secondary | ICD-10-CM | POA: Diagnosis not present

## 2018-05-06 DIAGNOSIS — M1712 Unilateral primary osteoarthritis, left knee: Secondary | ICD-10-CM | POA: Diagnosis not present

## 2018-05-11 ENCOUNTER — Other Ambulatory Visit: Payer: BLUE CROSS/BLUE SHIELD

## 2018-05-13 DIAGNOSIS — M170 Bilateral primary osteoarthritis of knee: Secondary | ICD-10-CM | POA: Diagnosis not present

## 2018-05-18 ENCOUNTER — Ambulatory Visit
Admission: RE | Admit: 2018-05-18 | Discharge: 2018-05-18 | Disposition: A | Discharge status: Home / Self Care | Payer: BLUE CROSS/BLUE SHIELD | Source: Ambulatory Visit | Attending: Internal Medicine | Admitting: Internal Medicine

## 2018-05-18 DIAGNOSIS — Z87891 Personal history of nicotine dependence: Secondary | ICD-10-CM

## 2018-05-18 DIAGNOSIS — Z0000 Encounter for general adult medical examination without abnormal findings: Secondary | ICD-10-CM

## 2018-06-21 ENCOUNTER — Other Ambulatory Visit: Payer: Self-pay | Admitting: Cardiovascular Disease

## 2018-06-21 MED ORDER — APIXABAN 5 MG PO TABS: ORAL_TABLET | ORAL | 5 refills | Status: DC

## 2018-06-21 NOTE — Telephone Encounter (Signed)
 *  STAT* If patient is at the pharmacy, call can be transferred to refill team.   1. Which medications need to be refilled? (please list name of each medication and dose if known) ELIQUIS 5 MG TABS tablet  2. Which pharmacy/location (including street and city if local pharmacy) is medication to be sent to? [facility] [address]  3. Do they need a 30 day or 90 day supply? 30  Patient only has couple days left

## 2018-06-21 NOTE — Telephone Encounter (Signed)
Eliquis refilled.  

## 2018-08-23 DIAGNOSIS — M545 Low back pain: Secondary | ICD-10-CM | POA: Diagnosis not present

## 2018-11-08 DIAGNOSIS — G629 Polyneuropathy, unspecified: Secondary | ICD-10-CM | POA: Diagnosis not present

## 2018-11-08 DIAGNOSIS — E785 Hyperlipidemia, unspecified: Secondary | ICD-10-CM | POA: Diagnosis not present

## 2018-11-08 DIAGNOSIS — I10 Essential (primary) hypertension: Secondary | ICD-10-CM | POA: Diagnosis not present

## 2018-11-08 DIAGNOSIS — E1149 Type 2 diabetes mellitus with other diabetic neurological complication: Secondary | ICD-10-CM | POA: Diagnosis not present

## 2018-12-10 DIAGNOSIS — Z23 Encounter for immunization: Secondary | ICD-10-CM | POA: Diagnosis not present

## 2018-12-16 ENCOUNTER — Other Ambulatory Visit: Payer: Self-pay

## 2018-12-16 MED ORDER — APIXABAN 5 MG PO TABS: ORAL_TABLET | ORAL | 5 refills | Status: DC

## 2019-01-11 DIAGNOSIS — C44519 Basal cell carcinoma of skin of other part of trunk: Secondary | ICD-10-CM | POA: Diagnosis not present

## 2019-01-11 DIAGNOSIS — L821 Other seborrheic keratosis: Secondary | ICD-10-CM | POA: Diagnosis not present

## 2019-01-11 DIAGNOSIS — D2261 Melanocytic nevi of right upper limb, including shoulder: Secondary | ICD-10-CM | POA: Diagnosis not present

## 2019-01-11 DIAGNOSIS — Z85828 Personal history of other malignant neoplasm of skin: Secondary | ICD-10-CM | POA: Diagnosis not present

## 2019-01-11 DIAGNOSIS — D225 Melanocytic nevi of trunk: Secondary | ICD-10-CM | POA: Diagnosis not present

## 2019-01-11 DIAGNOSIS — L570 Actinic keratosis: Secondary | ICD-10-CM | POA: Diagnosis not present

## 2019-01-17 ENCOUNTER — Other Ambulatory Visit: Payer: Self-pay

## 2019-01-17 MED ORDER — METOPROLOL SUCCINATE ER 50 MG PO TB24: ORAL_TABLET | ORAL | 3 refills | Status: DC

## 2019-01-21 DIAGNOSIS — M170 Bilateral primary osteoarthritis of knee: Secondary | ICD-10-CM | POA: Diagnosis not present

## 2019-01-28 DIAGNOSIS — M170 Bilateral primary osteoarthritis of knee: Secondary | ICD-10-CM | POA: Diagnosis not present

## 2019-02-04 DIAGNOSIS — M170 Bilateral primary osteoarthritis of knee: Secondary | ICD-10-CM | POA: Diagnosis not present

## 2019-02-09 ENCOUNTER — Telehealth: Payer: Self-pay | Admitting: Cardiovascular Disease

## 2019-02-09 DIAGNOSIS — E782 Mixed hyperlipidemia: Secondary | ICD-10-CM

## 2019-02-09 DIAGNOSIS — Z79899 Other long term (current) drug therapy: Secondary | ICD-10-CM

## 2019-02-09 DIAGNOSIS — I480 Paroxysmal atrial fibrillation: Secondary | ICD-10-CM

## 2019-02-09 NOTE — Telephone Encounter (Signed)
   Patient requesting order  for annual labs 

## 2019-02-09 NOTE — Telephone Encounter (Signed)
Called patient, LVM advising that I had ordered annual labs for visit with Dr.Kelly- advised of fasting labs and no appointment needed for lab visit. Left call back number if questions.

## 2019-02-15 DIAGNOSIS — Z79899 Other long term (current) drug therapy: Secondary | ICD-10-CM | POA: Diagnosis not present

## 2019-02-15 DIAGNOSIS — I480 Paroxysmal atrial fibrillation: Secondary | ICD-10-CM | POA: Diagnosis not present

## 2019-02-15 DIAGNOSIS — E782 Mixed hyperlipidemia: Secondary | ICD-10-CM | POA: Diagnosis not present

## 2019-02-15 LAB — LIPID PANEL
Chol/HDL Ratio: 2.2 ratio (ref 0.0–5.0)
Cholesterol, Total: 142 mg/dL (ref 100–199)
HDL: 64 mg/dL (ref >39)
LDL Chol Calc (NIH): 61 mg/dL (ref 0–99)
Triglycerides: 91 mg/dL (ref 0–149)
VLDL Cholesterol Cal: 17 mg/dL (ref 5–40)

## 2019-02-15 LAB — COMPREHENSIVE METABOLIC PANEL
ALT: 19 IU/L (ref 0–44)
AST: 19 IU/L (ref 0–40)
Albumin/Globulin Ratio: 2 (ref 1.2–2.2)
Albumin: 4.4 g/dL (ref 3.7–4.7)
Alkaline Phosphatase: 40 IU/L (ref 39–117)
BUN/Creatinine Ratio: 20 (ref 10–24)
BUN: 18 mg/dL (ref 8–27)
Bilirubin Total: 0.5 mg/dL (ref 0.0–1.2)
CO2: 22 mmol/L (ref 20–29)
Calcium: 9.6 mg/dL (ref 8.6–10.2)
Chloride: 104 mmol/L (ref 96–106)
Creatinine, Ser: 0.91 mg/dL (ref 0.76–1.27)
GFR calc Af Amer: 94 mL/min/{1.73_m2} (ref >59)
GFR calc non Af Amer: 81 mL/min/{1.73_m2} (ref >59)
Globulin, Total: 2.2 g/dL (ref 1.5–4.5)
Glucose: 153 mg/dL — ABNORMAL HIGH (ref 65–99)
Potassium: 4.6 mmol/L (ref 3.5–5.2)
Sodium: 139 mmol/L (ref 134–144)
Total Protein: 6.6 g/dL (ref 6.0–8.5)

## 2019-02-15 LAB — CBC
Hematocrit: 41.5 % (ref 37.5–51.0)
Hemoglobin: 13.4 g/dL (ref 13.0–17.7)
MCH: 30.2 pg (ref 26.6–33.0)
MCHC: 32.3 g/dL (ref 31.5–35.7)
MCV: 94 fL (ref 79–97)
Platelets: 235 10*3/uL (ref 150–450)
RBC: 4.44 x10E6/uL (ref 4.14–5.80)
RDW: 12.9 % (ref 11.6–15.4)
WBC: 6.5 10*3/uL (ref 3.4–10.8)

## 2019-02-15 LAB — TSH: TSH: 4.38 u[IU]/mL (ref 0.450–4.500)

## 2019-02-18 ENCOUNTER — Encounter: Payer: Self-pay | Admitting: Cardiovascular Disease

## 2019-02-18 ENCOUNTER — Other Ambulatory Visit: Payer: Self-pay

## 2019-02-18 ENCOUNTER — Ambulatory Visit (INDEPENDENT_AMBULATORY_CARE_PROVIDER_SITE_OTHER): Payer: BLUE CROSS/BLUE SHIELD | Admitting: Cardiovascular Disease

## 2019-02-18 VITALS — BP 132/78 | HR 58 | Ht 73.0 in | Wt 205.2 lb

## 2019-02-18 DIAGNOSIS — Z7901 Long term (current) use of anticoagulants: Secondary | ICD-10-CM

## 2019-02-18 DIAGNOSIS — I480 Paroxysmal atrial fibrillation: Secondary | ICD-10-CM

## 2019-02-18 DIAGNOSIS — E782 Mixed hyperlipidemia: Secondary | ICD-10-CM | POA: Diagnosis not present

## 2019-02-18 DIAGNOSIS — E119 Type 2 diabetes mellitus without complications: Secondary | ICD-10-CM

## 2019-02-18 DIAGNOSIS — I10 Essential (primary) hypertension: Secondary | ICD-10-CM | POA: Diagnosis not present

## 2019-02-18 MED ORDER — ROSUVASTATIN CALCIUM 10 MG PO TABS: ORAL_TABLET | ORAL | 3 refills | Status: DC

## 2019-02-18 MED ORDER — FENOFIBRATE 145 MG PO TABS: 145.0000 mg | ORAL_TABLET | Freq: Every day | ORAL | 3 refills | Status: DC

## 2019-02-18 MED ORDER — METOPROLOL SUCCINATE ER 50 MG PO TB24: ORAL_TABLET | ORAL | 3 refills | Status: DC

## 2019-02-18 MED ORDER — APIXABAN 5 MG PO TABS: ORAL_TABLET | ORAL | 3 refills | Status: DC

## 2019-02-18 NOTE — Progress Notes (Signed)
Patient ID: Victor Sanchez, male   DOB: 28-Oct-1940, 78 y.o.   MRN: [id number]    Primary MD: Dr. Osborne Casco  HPI: [name] is a 78 year old white male who presents for a 13 month follow-up cardiology evaluation.  [name] developed  atypical chest pain in 1999. A nuclear perfusion study raised the possibility of focal inferior scar with possible inferolateral redistribution. Cardiac catheterization revealed normal LV function and normal coronary arteries. In 2008 a nuclear study showed diaphragmatic attenuation although the ormal interpretation was interpreted as possible ischemia. In January 2015 he developed URI symptoms and had  significant difficulty with sleep. He also had significant deviated septum, and has had episodes with paroxysmal positional vertigo. As part of his preoperative evaluation for ENT surgery he was found to have possible abnormal heart rhythms. He underwent his ENT surgery for with a nasal septoplasty, reduction of his inferior turbinates, and LAUP. When Dr. Erik Obey saw him  he was concerned about the possibility of atrial fibrillation.  He reestablished care with me last year following this episode.  At that time prior to a planned trip to [place] for 2 weeks I recommended that he undergo laboratory as well as wear a CardioNet monitor.  Blood work was abnormal in that his total cholesterol was 203, triglycerides 398, HDL 51, VLDL 80 LDL 72.  Magnesium was normal.  His fasting glucose was 165 and the hemoglobin A1c was elevated at 8.0 indicative of a mean plasma glucose of approximately 183.  He had normal hemoglobin and hematocrit.  A 2-D echo Doppler study revealed an ejection fraction in the 55-60%.  On his monitor, he developed recurrent episodes of paroxysmal atrial fibrillation with rates in the low 100s, but in one instance  he developed a 10 beat run of PSVT approximately 178 beats per minute.  He was contacted regarding these results and was brought into the office on  06/23/2013.  An echo Doppler  revealed mild aortic valve sclerosis, mild tricuspid regurgitation, and mild MR, with normal systolic function.  His laboratory was suggestive of type 2 diabetes mellitus.  With his episodes of PAF noted on his monitor.  I recommended institution of low-dose Toprol initially at 25 mg and titrate this to 50 mg.  I also extended his cardiac monitor beyond the initial 10 days.  I recommended the addition of fenofibrate 145 mg in light of his significant triglyceride elevation and also suggested initiation of metformin 500 mg daily with plans for primary care followup.  The subsequent CardioNet monitor did not demonstrate any further episodes of PAF on his increased beta blocker therapy although he did have some sinus tachycardia during activity.  Repeat blood work was significantly improved with his fasting glucose improved from 165, and remotely, 202.  Liver function studies were normal.  Renal function was normal.  In August 2015 he had continued to be very dedicated both with exercise, as well as improved diet.  He was walking 3-4 miles per day and lost approximately 25 pounds.  He was unaware of any arrhythmia.  He denies chest pain or shortness of breath.  Followup blood work revealed marked improvement in his hemoglobin A1c from 8.0-6.1.  His lipid studies were markedly improved with triglycerides being reduced to 96 from 398.  An NMR lipoprotein showed an LDL particle number was excellent at 799, with calculated LDL 78.  HDL particle number was 37.9.  He was no longer in resistant with an insulin resistance score 37.  When I saw him  in February 2016 he complained that his chest would quiver on the left and he would notice some occasional pounding from within.  He was uncertain if he was having potential breakthrough atrial fibrillation or for these were just muscle spasms.  They have been occurring daily.  The episodes would last 5-6 seconds and then resolve.  He denied  associated presyncope or syncope or nocturnal symptoms.  At that time, I felt most likely this symptomatology was due to pectoralis muscle fasciculations rather than recurrent AF.  He wore a 2 week event monitor from 04/19/2014 through 05/02/2014.  This did not demonstrate any significant arrhythmia, but showed predominantly sinus rhythm with sinus bradycardia and rare PACs and rare isolated PVCs.  Assessment laboratory was also done which I reviewed with him in detail today.  His hemoglobin and hematocrit were stable.  Fasting glucose was mildly increased at 126.  Normal renal function and electrolytes.  TSH was borderline increased at 4.7.  His studies were normal.  Lipid studies continue to be excellent with a total cholesterol of 171, triglycerides 90, HDL 57, and LDL 96, on his current therapy.    When I saw him in September 2016, he was doing  well.  He went to [place] and [place].   Later he underwent arthroscopic knee surgery which took him 3-4 months to recover.  As result, his exercise has been reduced.  He is unaware of any recurrent arrhythmia.  In September 2016 LDL cholesterol was 88.  Total cholesterol 160, triglycerides 102.   Blood work from May 2017 had shown an increase in his hemoglobin A1c to 7.0.  Over the past 6 months, he has made a conscious effort to have improved diet.  He is exercising regularly.  He denies chest pain.  He denies palpitations.  He underwent repeat blood work last week which showed a normal CBC.  Hemoglobin A1c has improved to 6.5.  Total cholesterol was 178, triglycerides 102, his LDL was 95.  He has continued to be on fenofibrate 145 mg and pravastatin 40 mg.    I  saw him in October 2018 at which time he was remaining stable.  He was active and exercise regularly.  He had been successful with weight loss and his weight is down to 202 pounds. He traveled to [place] and the [employer].  He has had some issues with his left knee and was contemplating  surgery but ultimately this was aborted.  He is unaware of any recurrent atrial fibrillation.   Dr. Rosana Hoes checked his blood work and his hemoglobin A1c was 6.3.  He was on Toprol-XL 50 mg with control of palpitations or arrhythmia; eliquis for anticoagulation.;combination rosuvastatin 10 mg and fenofibrate 145 mg for mixed hyperlipidemia.  He was on Proscar 5 mg for his prostate and Flomax.    I last saw him in October 2019 at which time he continued to remain stable from a cardiac standpoint.  He had recently returned from a three-week trip to [place] and had gained 5 pounds during the excursion.  He denied any chest pain or palpitations.  Repeat laboratory from January 08, 2018 showed total cholesterol 142, triglycerides 94, HDL 64, LDL 59.  Hemoglobin hematocrit were stable.  Glucose was increased at 141.  He had normal renal function and LFTs.  TSH was minimally increased at 5.2.  Hemoglobin A1c was slightly elevated at 6.7.    Since I last saw him, he has continued to  remain stable.  He has had purposeful weight loss and compared to his peak weight he has lost over 40 pounds.  Since I last saw him he has lost 16 pounds.  He denies any chest pain.  He is unaware of any recurrent atrial fibrillation.  He has had difficulty with his blood sugar and is eating a proper diet.  Recent laboratory in August 2020 had shown his hemoglobin A1c increased at 8.1.  He had follow-up laboratory on February 15, 2019.  Fasting glucose was 153.  Renal function was normal with a BUN of 18 creatinine 0.91.  He was not anemic.  Lipid studies were excellent with total cholesterol 142, triglycerides 91, HDL 64, and LDL 61 on rosuvastatin 10 mg in addition to fenofibrate.  He continues to be on apixaban and denies any bleeding.  He has been taking Toprol-XL 50 mg daily and denies palpitations.  He presents for yearly evaluation.  Past Medical History:  Diagnosis Date   Diabetes mellitus without complication ([place])    Enlarged  prostate    Headache(784.0)    occ.   History of asthma    as a child   Hyperlipidemia    Hypertension    Sleep apnea    2013    Past Surgical History:  Procedure Laterality Date   APPENDECTOMY  1968   CARDIAC CATHETERIZATION  09/06/1997   normal LV function, normal coronaries (Dr. Shelva Majestic)   KNEE ARTHROSCOPY  1990'   with baker's cyst excision unsure side   NASAL SEPTOPLASTY W/ TURBINOPLASTY Bilateral 05/25/2013   Procedure: NASAL SEPTOPLASTY WITH TURBINATE REDUCTION;  Surgeon: Jodi Marble, MD;  Location: [facility];  Service: ENT;  Laterality: Bilateral;   NASOPHARYNGOSCOPY  1972   cyst removal   NM MYOCAR PERF WALL MOTION  03/2006   bruce myoview - mild-mod perfusion defect in basal inferoseptal walls with mild reversibility at rest; mild to mod defect in mid inferior wall with mild reversibility at rest; EF 63%; intermediate risk scan   TONSILLECTOMY     as a child   TRANSTHORACIC ECHOCARDIOGRAM  03/2006   EF=>55%, mild conc LVH; borderline RV/RA enlargement; borderline MVP, prolapse of anterior leaflets, mild MR; mild TR; AV mildly sclerotic   UVULECTOMY N/A 05/25/2013   Procedure: Myrtis Ser ASSISTED;  Surgeon: Jodi Marble, MD;  Location: [facility];  Service: ENT;  Laterality: N/A;   VENTRAL HERNIA REPAIR     x 2    No Known Allergies  Current Outpatient Medications  Medication Sig Dispense Refill   apixaban (ELIQUIS) 5 MG TABS tablet TAKE 1 TABLET(5 MG) BY MOUTH TWICE DAILY 180 tablet 3   fenofibrate (TRICOR) 145 MG tablet Take 1 tablet (145 mg total) by mouth daily. 90 tablet 3   finasteride (PROSCAR) 5 MG tablet Take 5 mg by mouth daily.     metoprolol succinate (TOPROL-XL) 50 MG 24 hr tablet TAKE 1 TABLET BY MOUTH EVERY DAY WITH OR IMMEDIATELY FOLLOWING A MEAL. 90 tablet 3   Multiple Vitamins-Minerals (MULTIVITAMIN WITH MINERALS) tablet Take 1 tablet by mouth daily.     rosuvastatin (CRESTOR) 10 MG tablet TAKE 1 TABLET(10 MG) BY MOUTH DAILY  90 tablet 3   tamsulosin (FLOMAX) 0.4 MG CAPS capsule Take 0.4 mg by mouth daily.     No current facility-administered medications for this visit.     Socially he is married. He has 3 children 4 grandchildren. There is no tobacco use. He did smoke for short-term and quit in 1969.  He does drink alcohol.  Family History  Problem Relation Age of Onset   Kidney failure Mother    Emphysema Father    Heart disease Other    Breast cancer Sister    Multiple myeloma Sister      ROS General: Negative; No fevers, chills, or night sweats; positive for purposeful 6 pound weight loss HEENT: Negative; No changes in vision or hearing, sinus congestion, difficulty swallowing Pulmonary: Negative; No cough, wheezing, shortness of breath, hemoptysis Cardiovascular: See history of present illness GI: Negative; No nausea, vomiting, diarrhea, or abdominal pain GU: Negative; No dysuria, hematuria, or difficulty voiding Musculoskeletal: Negative; no myalgias, joint pain, or weakness Hematologic/Oncology: Negative; no easy bruising, bleeding Endocrine: Negative; no heat/cold intolerance; he is unaware of polydipsia or polyuria Neuro: Negative; no changes in balance, headaches Skin: Negative; No rashes or skin lesions Psychiatric: Negative; No behavioral problems, depression Sleep: Positive for sleep apnea, currently not on CPAP, but status post recent ENT surgery; No snoring, daytime sleepiness, hypersomnolence, bruxism, restless legs, hypnogognic hallucinations, no cataplexy Other comprehensive 14 point system review is negative.   PE BP 132/78    Pulse (!) 58    Ht '6\' 1"'$  (1.854 m)    Wt 205 lb 3.2 oz (93.1 kg)    BMI 27.07 kg/m    Repeat blood pressure by me was 120/78 supine and 118/78 standing.  Wt Readings from Last 3 Encounters:  02/18/19 205 lb 3.2 oz (93.1 kg)  01/14/18 221 lb 12.8 oz (100.6 kg)  12/16/16 202 lb 3.2 oz (91.7 kg)   General: Alert, oriented, no distress.  Skin:  normal turgor, no rashes, warm and dry HEENT: Normocephalic, atraumatic. Pupils equal round and reactive to light; sclera anicteric; extraocular muscles intact;  Nose without nasal septal hypertrophy Mouth/Parynx benign; Mallinpatti scale 3 Neck: No JVD, no carotid bruits; normal carotid upstroke Lungs: clear to ausculatation and percussion; no wheezing or rales Chest wall: without tenderness to palpitation Heart: PMI not displaced, RRR, s1 s2 normal, 1/6 systolic murmur, no diastolic murmur, no rubs, gallops, thrills, or heaves Abdomen: soft, nontender; no hepatosplenomehaly, BS+; abdominal aorta nontender and not dilated by palpation. Back: no CVA tenderness Pulses 2+ Musculoskeletal: full range of motion, normal strength, no joint deformities Extremities: no clubbing cyanosis or edema, Homan's sign negative  Neurologic: grossly nonfocal; Cranial nerves grossly wnl Psychologic: Normal mood and affect   ECG (independently read by me): Sinus bradycardia 58 bpm.  Nonspecific ST changes.  Normal intervals.  No ectopy.  October 2019 ECG (independently read by me): Normal sinus rhythm at 60 bpm.  PAC.  Nonspecific ST changes.  October 2018 ECG (independently read by me): sinus bradycardia at 62 bpm.  Premature ventricular contractions.  Normal intervals.  No ST segment changes.  December 2017 ECG (independently read by me): Normal sinus rhythm at 68 bpm.  Rare PAC, occasional PVC.  Nonspecific ST changes.  April 2017 ECG (independently read by me): Normal sinus rhythm with PAC pattern of atrial bigeminy  September 2016 ECG (independently read by me): Sinus rhythm with PAC's, atrial bigeminy for which he is entirely asymptomatic.  05/10/2014 ECG (independently read by me): Sinus rhythm with sinus arrhythmia and  PACs with an atrial bigeminal rhythm  04/19/2014 ECG (independently read by me): Normal sinus rhythm at 61 bpm.  One isolated PVC.  No significant ST segment changes.  Normal  intervals.  Prior ECG (independently read by me) normal sinus rhythm at 64 beats per minute.  Normal intervals.  No  ST segment changes.  ECG on 06/23/13 (independently read by me) normal sinus rhythm at 63 beats per minute.  PR interval 150 ms, QTc interval 433 ms  Prior 06/15/2013 ECG (independently read by me): Normal sinus rhythm at 72 beats per minute. PR interval 144 ms; QTc interval 451 ms  LABS:  BMP Latest Ref Rng & Units 02/15/2019 01/12/2018 02/25/2016  Glucose 65 - 99 mg/dL 153(H) 141(H) 154(H)  BUN 8 - 27 mg/dL 18 19 21   Creatinine 0.76 - 1.27 mg/dL 0.91 0.96 0.94  BUN/Creat Ratio 10 - 24 20 20  -  Sodium 134 - 144 mmol/L 139 140 139  Potassium 3.5 - 5.2 mmol/L 4.6 4.9 4.7  Chloride 96 - 106 mmol/L 104 102 105  CO2 20 - 29 mmol/L 22 23 27   Calcium 8.6 - 10.2 mg/dL 9.6 9.6 9.3    Hepatic Function Latest Ref Rng & Units 02/15/2019 01/12/2018 02/25/2016  Total Protein 6.0 - 8.5 g/dL 6.6 6.7 6.6  Albumin 3.7 - 4.7 g/dL 4.4 4.6 4.2  AST 0 - 40 IU/L 19 24 20   ALT 0 - 44 IU/L 19 22 23   Alk Phosphatase 39 - 117 IU/L 40 34(L) 32(L)  Total Bilirubin 0.0 - 1.2 mg/dL 0.5 0.4 0.5    CBC Latest Ref Rng & Units 02/15/2019 01/12/2018 02/25/2016  WBC 3.4 - 10.8 x10E3/uL 6.5 7.1 6.6  Hemoglobin 13.0 - 17.7 g/dL 13.4 13.7 14.1  Hematocrit 37.5 - 51.0 % 41.5 41.6 42.9  Platelets 150 - 450 x10E3/uL 235 231 244   Lab Results  Component Value Date   MCV 94 02/15/2019   MCV 90 01/12/2018   MCV 90.1 02/25/2016   Lab Results  Component Value Date   TSH 4.380 02/15/2019   Lab Results  Component Value Date   HGBA1C 6.7 (H) 01/12/2018   No results found for: PROBNP  Lipid Panel     Component Value Date/Time   CHOL 142 02/15/2019 0830   CHOL 152 10/25/2013 0804   TRIG 91 02/15/2019 0830   TRIG 96 10/25/2013 0804   HDL 64 02/15/2019 0830   HDL 55 10/25/2013 0804   CHOLHDL 2.2 02/15/2019 0830   CHOLHDL 2.8 02/25/2016 0803   VLDL 20 02/25/2016 0803   LDLCALC 61 02/15/2019 0830    LDLCALC 78 10/25/2013 0804    NMR Profile 10/25/2013   Ref Range 30moago  157mogo      LDL Particle Number <1000 nmol/L 799    Comments: Reference Range  Low < 1000  Moderate 1000 - 1299  Borderline-High 1300 - 1599  High 1600 - 2000  Very High > 2000     LDL (calc) <100 mg/dL 78 72R, CM   Comments: LDL-C is inaccurate if patient is nonfasting. Reference Range  Optimal < 100  Near/Above Optimal 100 - 129  Borderline High 130 - 159  High 160 - 189  Very High >= 190     HDL-C >=40 mg/dL 55     Triglycerides <150 mg/dL 96 398 (H)    Cholesterol, Total <200 mg/dL 152     HDL Particle Number >=30.5 umol/L 37.9     Large HDL-P >=4.8 umol/L 7.6     Large VLDL-P <=2.7 nmol/L 2.5     Small LDL Particle Number <=527 nmol/L 275     LDL Size >20.5 nm 20.9     HDL Size >=9.2 nm 9.0 (L)     VLDL Size <=46.6 nm 41.4     LP-IR  Score <=45  37       RADIOLOGY: No results found.   IMPRESSION:  1. PAF (paroxysmal atrial fibrillation) ([place])   2. Essential hypertension   3. Mixed hyperlipidemia   4. Type 2 diabetes mellitus without complication, without long-term current use of insulin ([place])   5. Anticoagulation adequate     ASSESSMENT AND PLAN: Mr. Boniface Goffe is a young appearing 78 year-old gentleman who had  normal coronary arteries and normal LV function at cardiac catheterization in1999 after a nuclear perfusion study raised the possibility of subtle ischemia. In 2015, he had developed overt diabetes, and also had episodes of paroxysmal atrial fibrillation.  With significant lifestyle adjustment he initially lost approximately 25 pounds,  his blood pressure  normalized and his hemoglobin A1c significantly improved.  Over the 5-year period, he has now lost a total of approximately 40 pounds.  Since I last saw him in October 2019 his weight has reduced from 221 pounds down to 205 pounds today.  Blood pressure is excellent without orthostatic change.  He is  unaware of any recurrent episodes of atrial fibrillation.  He continues to be on Eliquis 5 mg twice a day for anticoagulation.  There is no bleeding.  He continues to be on metoprolol succinate 50 mg daily.  ECG reveals sinus bradycardia.  He continues to be on fenofibrate and rosuvastatin for mixed hyperlipidemia.  Most recent lipid studies are excellent.  He is diabetic and despite proper diet, most recent fasting glucose is increased at 153 and a hemoglobin A1c obtained in August by Dr. Osborne Casco was significantly increased at 8.1.  He is now followed by Dr. Lang Snow for primary care in place of Dr. Osborne Casco.  Dr. Brigitte Pulse is considering initiation of Ozempic for diabetic medication.  He has remained active.  However he is not able to exercise as he had in the past due to bilateral knee discomfort.  But typically he does walk up to 3 miles per day with his dogs at a slow pace.  I will see him in 1 year for reevaluation or sooner if problems arise.  Time spent: 25 minutes Troy Sine, MD, [facility] 02/20/2019 10:16 AM

## 2019-02-18 NOTE — Patient Instructions (Signed)
Medication Instructions:  medictons refilled *If you need a refill on your cardiac medications before your next appointment, please call your pharmacy*  Lab Work: Not needed If you have labs (blood work) drawn today and your tests are completely normal, you will receive your results only by: Marland Kitchen MyChart Message (if you have MyChart) OR . A paper copy in the mail If you have any lab test that is abnormal or we need to change your treatment, we will call you to review the results.  Testing/Procedures: Not needed  Follow-Up: At [facility], you and your health needs are our priority.  As part of our continuing mission to provide you with exceptional heart care, we have created designated Provider Care Teams.  These Care Teams include your primary Cardiologist (physician) and Advanced Practice Providers (APPs -  Physician Assistants and Nurse Practitioners) who all work together to provide you with the care you need, when you need it.  Your next appointment:   12 month(s)  The format for your next appointment:   In Person  Provider:   Shelva Majestic, MD  Other Instructions

## 2019-02-20 ENCOUNTER — Encounter: Payer: Self-pay | Admitting: Cardiovascular Disease

## 2019-02-23 DIAGNOSIS — E1149 Type 2 diabetes mellitus with other diabetic neurological complication: Secondary | ICD-10-CM | POA: Diagnosis not present

## 2019-03-28 DIAGNOSIS — Z23 Encounter for immunization: Secondary | ICD-10-CM | POA: Diagnosis not present

## 2019-03-30 DIAGNOSIS — R972 Elevated prostate specific antigen [PSA]: Secondary | ICD-10-CM | POA: Diagnosis not present

## 2019-03-30 DIAGNOSIS — N401 Enlarged prostate with lower urinary tract symptoms: Secondary | ICD-10-CM | POA: Diagnosis not present

## 2019-03-30 DIAGNOSIS — R350 Frequency of micturition: Secondary | ICD-10-CM | POA: Diagnosis not present

## 2019-04-14 ENCOUNTER — Ambulatory Visit: Payer: BLUE CROSS/BLUE SHIELD

## 2019-04-18 DIAGNOSIS — Z23 Encounter for immunization: Secondary | ICD-10-CM | POA: Diagnosis not present

## 2019-05-05 DIAGNOSIS — Z0000 Encounter for general adult medical examination without abnormal findings: Secondary | ICD-10-CM | POA: Diagnosis not present

## 2019-05-05 DIAGNOSIS — I10 Essential (primary) hypertension: Secondary | ICD-10-CM | POA: Diagnosis not present

## 2019-05-05 DIAGNOSIS — E1149 Type 2 diabetes mellitus with other diabetic neurological complication: Secondary | ICD-10-CM | POA: Diagnosis not present

## 2019-05-05 DIAGNOSIS — E781 Pure hyperglyceridemia: Secondary | ICD-10-CM | POA: Diagnosis not present

## 2019-05-05 DIAGNOSIS — E785 Hyperlipidemia, unspecified: Secondary | ICD-10-CM | POA: Diagnosis not present

## 2019-05-05 DIAGNOSIS — Z1331 Encounter for screening for depression: Secondary | ICD-10-CM | POA: Diagnosis not present

## 2019-07-12 DIAGNOSIS — M545 Low back pain: Secondary | ICD-10-CM | POA: Diagnosis not present

## 2019-07-15 DIAGNOSIS — I10 Essential (primary) hypertension: Secondary | ICD-10-CM | POA: Diagnosis not present

## 2019-07-15 DIAGNOSIS — R82998 Other abnormal findings in urine: Secondary | ICD-10-CM | POA: Diagnosis not present

## 2019-07-21 DIAGNOSIS — J0100 Acute maxillary sinusitis, unspecified: Secondary | ICD-10-CM | POA: Diagnosis not present

## 2019-07-21 DIAGNOSIS — G4733 Obstructive sleep apnea (adult) (pediatric): Secondary | ICD-10-CM | POA: Diagnosis not present

## 2019-07-21 DIAGNOSIS — E1149 Type 2 diabetes mellitus with other diabetic neurological complication: Secondary | ICD-10-CM | POA: Diagnosis not present

## 2019-07-21 DIAGNOSIS — R05 Cough: Secondary | ICD-10-CM | POA: Diagnosis not present

## 2019-07-27 DIAGNOSIS — N401 Enlarged prostate with lower urinary tract symptoms: Secondary | ICD-10-CM | POA: Diagnosis not present

## 2019-07-27 DIAGNOSIS — R972 Elevated prostate specific antigen [PSA]: Secondary | ICD-10-CM | POA: Diagnosis not present

## 2019-07-27 DIAGNOSIS — R350 Frequency of micturition: Secondary | ICD-10-CM | POA: Diagnosis not present

## 2019-08-03 DIAGNOSIS — H31091 Other chorioretinal scars, right eye: Secondary | ICD-10-CM | POA: Diagnosis not present

## 2019-08-03 DIAGNOSIS — H25813 Combined forms of age-related cataract, bilateral: Secondary | ICD-10-CM | POA: Diagnosis not present

## 2019-08-03 DIAGNOSIS — H43811 Vitreous degeneration, right eye: Secondary | ICD-10-CM | POA: Diagnosis not present

## 2019-08-10 DIAGNOSIS — L570 Actinic keratosis: Secondary | ICD-10-CM | POA: Diagnosis not present

## 2019-08-10 DIAGNOSIS — L820 Inflamed seborrheic keratosis: Secondary | ICD-10-CM | POA: Diagnosis not present

## 2019-08-10 DIAGNOSIS — D485 Neoplasm of uncertain behavior of skin: Secondary | ICD-10-CM | POA: Diagnosis not present

## 2019-08-10 DIAGNOSIS — L821 Other seborrheic keratosis: Secondary | ICD-10-CM | POA: Diagnosis not present

## 2019-08-10 DIAGNOSIS — L905 Scar conditions and fibrosis of skin: Secondary | ICD-10-CM | POA: Diagnosis not present

## 2019-08-23 DIAGNOSIS — E1149 Type 2 diabetes mellitus with other diabetic neurological complication: Secondary | ICD-10-CM | POA: Diagnosis not present

## 2019-08-23 DIAGNOSIS — R269 Unspecified abnormalities of gait and mobility: Secondary | ICD-10-CM | POA: Diagnosis not present

## 2019-08-23 DIAGNOSIS — I480 Paroxysmal atrial fibrillation: Secondary | ICD-10-CM | POA: Diagnosis not present

## 2019-08-23 DIAGNOSIS — R6882 Decreased libido: Secondary | ICD-10-CM | POA: Diagnosis not present

## 2019-08-23 DIAGNOSIS — R5383 Other fatigue: Secondary | ICD-10-CM | POA: Diagnosis not present

## 2019-08-23 DIAGNOSIS — R946 Abnormal results of thyroid function studies: Secondary | ICD-10-CM | POA: Diagnosis not present

## 2019-10-04 DIAGNOSIS — H6121 Impacted cerumen, right ear: Secondary | ICD-10-CM | POA: Diagnosis not present

## 2019-10-04 DIAGNOSIS — R2689 Other abnormalities of gait and mobility: Secondary | ICD-10-CM | POA: Diagnosis not present

## 2019-10-04 DIAGNOSIS — J343 Hypertrophy of nasal turbinates: Secondary | ICD-10-CM | POA: Diagnosis not present

## 2019-10-05 ENCOUNTER — Telehealth: Payer: Self-pay | Admitting: Cardiovascular Disease

## 2019-10-05 NOTE — Telephone Encounter (Signed)
   [facility] Pre-operative Risk Assessment    HEARTCARE STAFF: - Please ensure there is not already an duplicate clearance open for this procedure. - Under Visit Info/Reason for Call, type in Other and utilize the format Clearance MM/DD/YY or Clearance TBD. Do not use dashes or single digits. - If request is for dental extraction, please clarify the # of teeth to be extracted.  Request for surgical clearance:  1. What type of surgery is being performed? Urolift   2. When is this surgery scheduled? TBD based on clearance  3. What type of clearance is required (medical clearance vs. Pharmacy clearance to hold med vs. Both)? Both  4. Are there any medications that need to be held prior to surgery and how long? Eliquis, 48 hrs prior   5. Practice name and name of physician performing surgery? Dr. Remo Lipps Dahlstedt   6. What is the office phone number? [phone]   7.   What is the office fax number? [phone]   8.   Anesthesia type (None, local, MAC, general) ? Nitrous sedation    Johnna Acosta 10/05/2019, 9:29 AM  _________________________________________________________________   (provider comments below)

## 2019-10-05 NOTE — Telephone Encounter (Signed)
Primary Cardiologist:Thomas Tresa Endo, MD  Chart reviewed as part of pre-operative protocol coverage. Because of Victor Sanchez's past medical history and time since last visit, he/she will require a follow-up visit in order to better assess preoperative cardiovascular risk.  Pre-op covering staff: - Please schedule appointment and call patient to inform them. - Please contact requesting surgeon's office via preferred method (i.e, phone, fax) to inform them of need for appointment prior to surgery.  If applicable, this message will also be routed to pharmacy pool and/or primary cardiologist for input on holding anticoagulant/antiplatelet agent as requested below so that this information is available at time of patient's appointment.   Ronney Asters, NP  10/05/2019, 9:53 AM

## 2019-10-05 NOTE — Telephone Encounter (Signed)
LM2CB-needs appt-pre-op clearance

## 2019-10-05 NOTE — Telephone Encounter (Signed)
Forwarded to requesting party via EPIC fax function 

## 2019-10-11 NOTE — Telephone Encounter (Signed)
Patient has an appointment on 11/04/19 with Edd Fabian.

## 2019-11-01 NOTE — Progress Notes (Signed)
Cardiology Clinic Note   Patient Name: Victor Sanchez Date of Encounter: 11/04/2019  Primary Care Provider:  Marton Redwood, MD Primary Cardiologist:  Victor Majestic, MD  Patient Profile    Victor Sanchez 79 year old male presents today for follow-up evaluation of his paroxysmal atrial fibrillation, essential hypertension, and preoperative cardiac evaluation.  Past Medical History    Past Medical History:  Diagnosis Date  . Diabetes mellitus without complication ([place])   . Enlarged prostate   . Headache(784.0)    occ.  Marland Kitchen History of asthma    as a child  . Hyperlipidemia   . Hypertension   . Sleep apnea    2013   Past Surgical History:  Procedure Laterality Date  . APPENDECTOMY  1968  . CARDIAC CATHETERIZATION  09/06/1997   normal LV function, normal coronaries (Dr. Shelva Sanchez)  . KNEE ARTHROSCOPY  1990'   with baker's cyst excision unsure side  . NASAL SEPTOPLASTY W/ TURBINOPLASTY Bilateral 05/25/2013   Procedure: NASAL SEPTOPLASTY WITH TURBINATE REDUCTION;  Surgeon: Victor Marble, MD;  Location: [facility];  Service: ENT;  Laterality: Bilateral;  . NASOPHARYNGOSCOPY  1972   cyst removal  . NM MYOCAR PERF WALL MOTION  03/2006   bruce myoview - mild-mod perfusion defect in basal inferoseptal walls with mild reversibility at rest; mild to mod defect in mid inferior wall with mild reversibility at rest; EF 63%; intermediate risk scan  . TONSILLECTOMY     as a child  . TRANSTHORACIC ECHOCARDIOGRAM  03/2006   EF=>55%, mild conc LVH; borderline RV/RA enlargement; borderline MVP, prolapse of anterior leaflets, mild MR; mild TR; AV mildly sclerotic  . UVULECTOMY N/A 05/25/2013   Procedure: Victor Sanchez ASSISTED;  Surgeon: Victor Marble, MD;  Location: [facility] OR;  Service: ENT;  Laterality: N/A;  . VENTRAL HERNIA REPAIR     x 2    Allergies  No Known Allergies  History of Present Illness    [name] is a PMH of cardiac arrhythmia, PAF, OSA, deviated nasal septum, type 2 diabetes,  hyperlipidemia, mild obesity, metabolic syndrome, palpitations, and essential hypertension.  He was seen with atypical chest pain in 1999.  His nuclear stress test showed the possibility of inferior scar with possible inferior lateral redistribution.  A cardiac catheterization showed normal LV function and normal coronary arteries.  A nuclear stress test in 2008 showed diaphragmatic attenuation with normal interpretation.  He underwent ENT surgery for a deviated septum and 2015 and was found to have abnormal heart rhythms.  There was concern for atrial fibrillation and he reestablish care with Dr. Claiborne Sanchez after the episode.  An echocardiogram study showed an ejection fraction 55-60%.  A cardiac event monitor showed recurrent episodes of PAF with rate in the low 100sx1.  He also had a 10 beat run of PSVT during that time 4/ 9/15.  He continued to remain stable and was last seen by Dr. Claiborne Sanchez 02/18/2019.  He was noted to have intentional weight loss and had lost over 40 pounds.  He denied chest pain he was unaware of recurrent atrial fibrillation.  He was still having trouble managing his blood sugar although he was eating better.  His lab results 8/20 showed a hemoglobin A1c of 8.1.  His BUN and creatinine were 18 and 0.9.  Lipid panel showed a total cholesterol of 142 HDL 64, and LDL 61.  His rosuvastatin was continued in addition to his fenofibrate.  He was walking 3 miles per day at a slow pace  with his dog.  He presents to the clinic today for follow-up evaluation and preoperative card evaluation.  He states he continues to be very physically active walking 2 miles per day and doing yard work. He states that he watches his diet closely and monitors his sugar intake. However he does not monitor his salt intake as closely. He has no complaints of palpitations or atrial fibrillation. He denies bleeding issues. I will give him the salty 6 diet sheet and have him continue his physical activity and follow-up with Dr.  Claiborne Sanchez in 6 months.  Today he denies chest pain, shortness of breath, lower extremity edema, fatigue, palpitations, melena, hematuria, hemoptysis, diaphoresis, weakness, presyncope, syncope, orthopnea, and PND.    Home Medications    Prior to Admission medications   Medication Sig Start Date End Date Taking? Authorizing Provider  apixaban (ELIQUIS) 5 MG TABS tablet TAKE 1 TABLET(5 MG) BY MOUTH TWICE DAILY 02/18/19   Victor Sine, MD  fenofibrate (TRICOR) 145 MG tablet Take 1 tablet (145 mg total) by mouth daily. 02/18/19   Victor Sine, MD  finasteride (PROSCAR) 5 MG tablet Take 5 mg by mouth daily.    [provider]  metoprolol succinate (TOPROL-XL) 50 MG 24 hr tablet TAKE 1 TABLET BY MOUTH EVERY DAY WITH OR IMMEDIATELY FOLLOWING A MEAL. 02/18/19   Victor Sine, MD  Multiple Vitamins-Minerals (MULTIVITAMIN WITH MINERALS) tablet Take 1 tablet by mouth daily.    [provider]  rosuvastatin (CRESTOR) 10 MG tablet TAKE 1 TABLET(10 MG) BY MOUTH DAILY 02/18/19   Victor Sine, MD  tamsulosin (FLOMAX) 0.4 MG CAPS capsule Take 0.4 mg by mouth daily.    [provider]    Family History    Family History  Problem Relation Age of Onset  . Kidney failure Mother   . Emphysema Father   . Heart disease Other   . Breast cancer Sister   . Multiple myeloma Sister    He indicated that his mother is deceased. He indicated that his father is deceased. He indicated that two of his four sisters are alive. He indicated that the status of his other is unknown.  Social History    Social History   Socioeconomic History  . Marital status: Married    Spouse name: Not on file  . Number of children: 3  . Years of education: master's  . Highest education level: Not on file  Occupational History    Employer: [employer]  Tobacco Use  . Smoking status: Former Smoker    Packs/day: 1.00    Years: 6.00    Pack years: 6.00    Quit date: 03/18/1967    Years since  quitting: 52.6  . Smokeless tobacco: Never Used  Substance and Sexual Activity  . Alcohol use: Yes    Alcohol/week: 7.0 standard drinks    Types: 7 Glasses of wine per week    Comment: glass of wine or cocktail daily  . Drug use: No  . Sexual activity: Not on file  Other Topics Concern  . Not on file  Social History Narrative  . Not on file   Social Determinants of Health   Financial Resource Strain:   . Difficulty of Paying Living Expenses: Not on file  Food Insecurity:   . Worried About Charity fundraiser in the Last Year: Not on file  . Ran Out of Food in the Last Year: Not on file  Transportation Needs:   .  Lack of Transportation (Medical): Not on file  . Lack of Transportation (Non-Medical): Not on file  Physical Activity:   . Days of Exercise per Week: Not on file  . Minutes of Exercise per Session: Not on file  Stress:   . Feeling of Stress : Not on file  Social Connections:   . Frequency of Communication with Friends and Family: Not on file  . Frequency of Social Gatherings with Friends and Family: Not on file  . Attends Religious Services: Not on file  . Active Member of Clubs or Organizations: Not on file  . Attends Archivist Meetings: Not on file  . Marital Status: Not on file  Intimate Partner Violence:   . Fear of Current or Ex-Partner: Not on file  . Emotionally Abused: Not on file  . Physically Abused: Not on file  . Sexually Abused: Not on file     Review of Systems    General:  No chills, fever, night sweats or weight changes.  Cardiovascular:  No chest pain, dyspnea on exertion, edema, orthopnea, palpitations, paroxysmal nocturnal dyspnea. Dermatological: No rash, lesions/masses Respiratory: No cough, dyspnea Urologic: No hematuria, dysuria Abdominal:   No nausea, vomiting, diarrhea, bright red blood per rectum, melena, or hematemesis Neurologic:  No visual changes, wkns, changes in mental status. All other systems reviewed and are  otherwise negative except as noted above.  Physical Exam    VS:  BP 122/78 (BP Location: Left Arm, Patient Position: Sitting, Cuff Size: Normal)   Pulse 64   Ht _0  (1.854 m)   Wt 209 lb (94.8 kg)   BMI 27.57 kg/m  , BMI Body mass index is 27.57 kg/m. GEN: Well nourished, well developed, in no acute distress. HEENT: normal. Neck: Supple, no JVD, carotid bruits, or masses. Cardiac: RRR, no murmurs, rubs, or gallops. No clubbing, cyanosis, edema.  Radials/DP/PT 2+ and equal bilaterally.  Respiratory:  Respirations regular and unlabored, clear to auscultation bilaterally. GI: Soft, nontender, nondistended, BS + x 4. MS: no deformity or atrophy. Skin: warm and dry, no rash. Neuro:  Strength and sensation are intact. Psych: Normal affect.  Accessory Clinical Findings    Recent Labs: 02/15/2019: ALT 19; BUN 18; Creatinine, Sanchez 0.91; Hemoglobin 13.4; Platelets 235; Potassium 4.6; Sodium 139; TSH 4.380   Recent Lipid Panel    Component Value Date/Time   CHOL 142 02/15/2019 0830   CHOL 152 10/25/2013 0804   TRIG 91 02/15/2019 0830   TRIG 96 10/25/2013 0804   HDL 64 02/15/2019 0830   HDL 55 10/25/2013 0804   CHOLHDL 2.2 02/15/2019 0830   CHOLHDL 2.8 02/25/2016 0803   VLDL 20 02/25/2016 0803   LDLCALC 61 02/15/2019 0830   LDLCALC 78 10/25/2013 0804    ECG personally reviewed by me today-normal sinus rhythm 64 bpm- No acute changes  EKG 02/18/2019 Sinus bradycardia nonspecific ST abnormality 58 bpm   Assessment & Plan   1.  Paroxysmal atrial fibrillation-EKG today shows normal sinus rhythm 64 bpm on anticoagulation with Eliquis Continue metoprolol, Eliquis Heart healthy low-sodium diet-salty 6 given Increase physical activity as tolerated Avoid triggers  Essential hypertension-BP today 122/78.  Well-controlled at home Continue metoprolol Heart healthy low-sodium diet-salty 6 given Increase physical activity as tolerated  Mixed hyperlipidemia-02/15/2019: Cholesterol,  Total 142; HDL 64; LDL Chol Calc (NIH) 61; Triglycerides 91 Continue rosuvastatin, fenofibrate Heart healthy low-sodium high-fiber diet.   Increase physical activity as tolerated  Disposition: Follow-up with Dr. Claiborne Sanchez in 6 months.  Preoperative cardiac evaluation-requesting urolift, Dr. Preston Fleeting [phone]     Primary Cardiologist: Victor Majestic, MD  Chart reviewed as part of pre-operative protocol coverage. Given past medical history and time since last visit, based on ACC/AHA guidelines, ARGIL MAHL would be at acceptable risk for the planned procedure without further cardiovascular testing.   RCRI is a class I risk of 0.4% risk of major cardiac event. He is able to complete greater than 4 METS of physical activity.  I will route this recommendation to the requesting party via Epic fax function and remove from pre-op pool.  Please call with questions.   Jossie Ng. Tayshawn Purnell NP-C    11/04/2019, 12:01 PM [address] [place] [address] Office [phone] Fax [phone]  Notice: This dictation was prepared with Dragon dictation along with smaller phrase technology. Any transcriptional errors that result from this process are unintentional and may not be corrected upon review.

## 2019-11-04 ENCOUNTER — Other Ambulatory Visit: Payer: Self-pay

## 2019-11-04 ENCOUNTER — Ambulatory Visit (INDEPENDENT_AMBULATORY_CARE_PROVIDER_SITE_OTHER): Payer: BLUE CROSS/BLUE SHIELD | Admitting: General Practice

## 2019-11-04 ENCOUNTER — Encounter: Payer: Self-pay | Admitting: General Practice

## 2019-11-04 VITALS — BP 122/78 | HR 64 | Ht 73.0 in | Wt 209.0 lb

## 2019-11-04 DIAGNOSIS — E782 Mixed hyperlipidemia: Secondary | ICD-10-CM

## 2019-11-04 DIAGNOSIS — Z01810 Encounter for preprocedural cardiovascular examination: Secondary | ICD-10-CM | POA: Diagnosis not present

## 2019-11-04 DIAGNOSIS — I480 Paroxysmal atrial fibrillation: Secondary | ICD-10-CM | POA: Diagnosis not present

## 2019-11-04 DIAGNOSIS — I10 Essential (primary) hypertension: Secondary | ICD-10-CM | POA: Diagnosis not present

## 2019-11-04 NOTE — Patient Instructions (Signed)
Medication Instructions:  The current medical regimen is effective;  continue present plan and medications as directed. Please refer to the Current Medication list given to you today. *If you need a refill on your cardiac medications before your next appointment, please call your pharmacy*  Special Instructions CLEARED FOR UPCOMING SURGERY-WE WILL CONTACT THEIR OFFICE TO NOTIFY THE OFFICE  PLEASE READ AND FOLLOW SALTY 6-ATTACHED  PLEASE INCREASE PHYSICAL ACTIVITY AS TOLERATED  Follow-Up: Your next appointment:  6 month(s)  In Person with Nicki Guadalajara, MD OR JESSE CLEAVER, FNP-C  At [facility], you and your health needs are our priority.  As part of our continuing mission to provide you with exceptional heart care, we have created designated Provider Care Teams.  These Care Teams include your primary Cardiologist (physician) and Advanced Practice Providers (APPs -  Physician Assistants and Nurse Practitioners) who all work together to provide you with the care you need, when you need it.  We recommend signing up for the patient portal called "MyChart".  Sign up information is provided on this After Visit Summary.  MyChart is used to connect with patients for Virtual Visits (Telemedicine).  Patients are able to view lab/test results, encounter notes, upcoming appointments, etc.  Non-urgent messages can be sent to your provider as well.   To learn more about what you can do with MyChart, go to [email or web address].

## 2019-11-16 DIAGNOSIS — M25511 Pain in right shoulder: Secondary | ICD-10-CM | POA: Diagnosis not present

## 2019-11-16 DIAGNOSIS — M170 Bilateral primary osteoarthritis of knee: Secondary | ICD-10-CM | POA: Diagnosis not present

## 2019-11-22 DIAGNOSIS — E1149 Type 2 diabetes mellitus with other diabetic neurological complication: Secondary | ICD-10-CM | POA: Diagnosis not present

## 2019-11-22 DIAGNOSIS — I10 Essential (primary) hypertension: Secondary | ICD-10-CM | POA: Diagnosis not present

## 2019-11-23 DIAGNOSIS — M170 Bilateral primary osteoarthritis of knee: Secondary | ICD-10-CM | POA: Diagnosis not present

## 2019-11-30 DIAGNOSIS — M170 Bilateral primary osteoarthritis of knee: Secondary | ICD-10-CM | POA: Diagnosis not present

## 2019-12-17 ENCOUNTER — Other Ambulatory Visit: Payer: Self-pay | Admitting: Cardiovascular Disease

## 2020-01-02 DIAGNOSIS — R351 Nocturia: Secondary | ICD-10-CM | POA: Diagnosis not present

## 2020-01-02 DIAGNOSIS — N401 Enlarged prostate with lower urinary tract symptoms: Secondary | ICD-10-CM | POA: Diagnosis not present

## 2020-01-03 DIAGNOSIS — R339 Retention of urine, unspecified: Secondary | ICD-10-CM | POA: Diagnosis not present

## 2020-01-03 DIAGNOSIS — N401 Enlarged prostate with lower urinary tract symptoms: Secondary | ICD-10-CM | POA: Diagnosis not present

## 2020-01-03 DIAGNOSIS — R351 Nocturia: Secondary | ICD-10-CM | POA: Diagnosis not present

## 2020-01-06 DIAGNOSIS — R351 Nocturia: Secondary | ICD-10-CM | POA: Diagnosis not present

## 2020-01-06 DIAGNOSIS — N401 Enlarged prostate with lower urinary tract symptoms: Secondary | ICD-10-CM | POA: Diagnosis not present

## 2020-01-06 DIAGNOSIS — R310 Gross hematuria: Secondary | ICD-10-CM | POA: Diagnosis not present

## 2020-01-11 DIAGNOSIS — M170 Bilateral primary osteoarthritis of knee: Secondary | ICD-10-CM | POA: Diagnosis not present

## 2020-01-17 ENCOUNTER — Other Ambulatory Visit: Payer: Self-pay

## 2020-01-17 MED ORDER — METOPROLOL SUCCINATE ER 50 MG PO TB24: ORAL_TABLET | ORAL | 3 refills | Status: DC

## 2020-02-01 DIAGNOSIS — N401 Enlarged prostate with lower urinary tract symptoms: Secondary | ICD-10-CM | POA: Diagnosis not present

## 2020-02-01 DIAGNOSIS — R339 Retention of urine, unspecified: Secondary | ICD-10-CM | POA: Diagnosis not present

## 2020-02-02 DIAGNOSIS — Z23 Encounter for immunization: Secondary | ICD-10-CM | POA: Diagnosis not present

## 2020-02-02 DIAGNOSIS — L814 Other melanin hyperpigmentation: Secondary | ICD-10-CM | POA: Diagnosis not present

## 2020-02-02 DIAGNOSIS — D1801 Hemangioma of skin and subcutaneous tissue: Secondary | ICD-10-CM | POA: Diagnosis not present

## 2020-02-02 DIAGNOSIS — L821 Other seborrheic keratosis: Secondary | ICD-10-CM | POA: Diagnosis not present

## 2020-02-02 DIAGNOSIS — Z85828 Personal history of other malignant neoplasm of skin: Secondary | ICD-10-CM | POA: Diagnosis not present

## 2020-02-02 DIAGNOSIS — C44712 Basal cell carcinoma of skin of right lower limb, including hip: Secondary | ICD-10-CM | POA: Diagnosis not present

## 2020-02-02 DIAGNOSIS — L570 Actinic keratosis: Secondary | ICD-10-CM | POA: Diagnosis not present

## 2020-02-15 DIAGNOSIS — R350 Frequency of micturition: Secondary | ICD-10-CM | POA: Diagnosis not present

## 2020-02-15 DIAGNOSIS — N401 Enlarged prostate with lower urinary tract symptoms: Secondary | ICD-10-CM | POA: Diagnosis not present

## 2020-02-15 DIAGNOSIS — R3915 Urgency of urination: Secondary | ICD-10-CM | POA: Diagnosis not present

## 2020-02-21 ENCOUNTER — Other Ambulatory Visit: Payer: Self-pay | Admitting: Cardiovascular Disease

## 2020-03-15 DIAGNOSIS — Z1159 Encounter for screening for other viral diseases: Secondary | ICD-10-CM | POA: Diagnosis not present

## 2020-06-21 DIAGNOSIS — T161XXA Foreign body in right ear, initial encounter: Secondary | ICD-10-CM | POA: Diagnosis not present

## 2020-06-21 DIAGNOSIS — M2669 Other specified disorders of temporomandibular joint: Secondary | ICD-10-CM | POA: Diagnosis not present

## 2020-07-02 DIAGNOSIS — R946 Abnormal results of thyroid function studies: Secondary | ICD-10-CM | POA: Diagnosis not present

## 2020-07-02 DIAGNOSIS — Z125 Encounter for screening for malignant neoplasm of prostate: Secondary | ICD-10-CM | POA: Diagnosis not present

## 2020-07-02 DIAGNOSIS — E785 Hyperlipidemia, unspecified: Secondary | ICD-10-CM | POA: Diagnosis not present

## 2020-07-03 DIAGNOSIS — E1149 Type 2 diabetes mellitus with other diabetic neurological complication: Secondary | ICD-10-CM | POA: Diagnosis not present

## 2020-07-04 DIAGNOSIS — H524 Presbyopia: Secondary | ICD-10-CM | POA: Diagnosis not present

## 2020-07-04 DIAGNOSIS — H31091 Other chorioretinal scars, right eye: Secondary | ICD-10-CM | POA: Diagnosis not present

## 2020-07-04 DIAGNOSIS — H25813 Combined forms of age-related cataract, bilateral: Secondary | ICD-10-CM | POA: Diagnosis not present

## 2020-07-04 DIAGNOSIS — H43811 Vitreous degeneration, right eye: Secondary | ICD-10-CM | POA: Diagnosis not present

## 2020-07-09 DIAGNOSIS — Z1212 Encounter for screening for malignant neoplasm of rectum: Secondary | ICD-10-CM | POA: Diagnosis not present

## 2020-07-09 DIAGNOSIS — Z1331 Encounter for screening for depression: Secondary | ICD-10-CM | POA: Diagnosis not present

## 2020-07-09 DIAGNOSIS — I10 Essential (primary) hypertension: Secondary | ICD-10-CM | POA: Diagnosis not present

## 2020-07-09 DIAGNOSIS — Z1339 Encounter for screening examination for other mental health and behavioral disorders: Secondary | ICD-10-CM | POA: Diagnosis not present

## 2020-07-09 DIAGNOSIS — Z0000 Encounter for general adult medical examination without abnormal findings: Secondary | ICD-10-CM | POA: Diagnosis not present

## 2020-07-09 DIAGNOSIS — E1149 Type 2 diabetes mellitus with other diabetic neurological complication: Secondary | ICD-10-CM | POA: Diagnosis not present

## 2020-07-17 ENCOUNTER — Ambulatory Visit (INDEPENDENT_AMBULATORY_CARE_PROVIDER_SITE_OTHER): Payer: BLUE CROSS/BLUE SHIELD | Admitting: Cardiovascular Disease

## 2020-07-17 ENCOUNTER — Encounter: Payer: Self-pay | Admitting: Cardiovascular Disease

## 2020-07-17 ENCOUNTER — Other Ambulatory Visit: Payer: Self-pay

## 2020-07-17 VITALS — BP 126/77 | HR 56 | Ht 72.0 in | Wt 210.0 lb

## 2020-07-17 DIAGNOSIS — E782 Mixed hyperlipidemia: Secondary | ICD-10-CM | POA: Diagnosis not present

## 2020-07-17 DIAGNOSIS — Z7901 Long term (current) use of anticoagulants: Secondary | ICD-10-CM

## 2020-07-17 DIAGNOSIS — E119 Type 2 diabetes mellitus without complications: Secondary | ICD-10-CM | POA: Diagnosis not present

## 2020-07-17 DIAGNOSIS — I480 Paroxysmal atrial fibrillation: Secondary | ICD-10-CM

## 2020-07-17 DIAGNOSIS — I10 Essential (primary) hypertension: Secondary | ICD-10-CM

## 2020-07-17 MED ORDER — ROSUVASTATIN CALCIUM 20 MG PO TABS: 20.0000 mg | ORAL_TABLET | Freq: Every day | ORAL | 3 refills | Status: DC

## 2020-07-17 NOTE — Patient Instructions (Signed)
Medication Instructions:  Increase Crestor to 20 mg daily Continue all other medications *If you need a refill on your cardiac medications before your next appointment, please call your pharmacy*   Lab Work: None ordered   Testing/Procedures: Echo in 1 year  May 2023   Follow-Up: At [facility] you and your health needs are our priority.  As part of our continuing mission to provide you with exceptional heart care, we have created designated Provider Care Teams.  These Care Teams include your primary Cardiologist (physician) and Advanced Practice Providers (APPs -  Physician Assistants and Nurse Practitioners) who all work together to provide you with the care you need, when you need it.  We recommend signing up for the patient portal called "MyChart".  Sign up information is provided on this After Visit Summary.  MyChart is used to connect with patients for Virtual Visits (Telemedicine).  Patients are able to view lab/test results, encounter notes, upcoming appointments, etc.  Non-urgent messages can be sent to your provider as well.   To learn more about what you can do with MyChart, go to [email or web address].    Your next appointment:  1 year   Call in Feb to schedule Echo and follow up appointment in May   The format for your next appointment:  Office     Provider:  [facility]

## 2020-07-17 NOTE — Progress Notes (Signed)
Patient ID: Victor Sanchez, male   DOB: 21-Apr-1940, 80 y.o.   MRN: [id number]    Primary MD: Dr. Osborne Casco  HPI: [name] is a 80 year old white male who presents for an 52 month follow-up cardiology evaluation.  [name] developed atypical chest pain in 1999. A nuclear perfusion study raised the possibility of focal inferior scar with possible inferolateral redistribution. Cardiac catheterization revealed normal LV function and normal coronary arteries. In 2008 a nuclear study showed diaphragmatic attenuation although the ormal interpretation was interpreted as possible ischemia. In January 2015 he developed URI symptoms and had  significant difficulty with sleep. He also had significant deviated septum, and has had episodes with paroxysmal positional vertigo. As part of his preoperative evaluation for ENT surgery he was found to have possible abnormal heart rhythms. He underwent his ENT surgery for with a nasal septoplasty, reduction of his inferior turbinates, and LAUP. When Dr. Erik Obey saw him  he was concerned about the possibility of atrial fibrillation.  He reestablished care with me last year following this episode.  At that time prior to a planned trip to [place] for 2 weeks I recommended that he undergo laboratory as well as wear a CardioNet monitor.  Blood work was abnormal in that his total cholesterol was 203, triglycerides 398, HDL 51, VLDL 80 LDL 72.  Magnesium was normal.  His fasting glucose was 165 and the hemoglobin A1c was elevated at 8.0 indicative of a mean plasma glucose of approximately 183.  He had normal hemoglobin and hematocrit.  A 2-D echo Doppler study revealed an ejection fraction in the 55-60%.  On his monitor, he developed recurrent episodes of paroxysmal atrial fibrillation with rates in the low 100s, but in one instance  he developed a 10 beat run of PSVT approximately 178 beats per minute.  He was contacted regarding these results and was brought into the office on  06/23/2013.  An echo Doppler  revealed mild aortic valve sclerosis, mild tricuspid regurgitation, and mild MR, with normal systolic function.  His laboratory was suggestive of type 2 diabetes mellitus.  With his episodes of PAF noted on his monitor.  I recommended institution of low-dose Toprol initially at 25 mg and titrate this to 50 mg.  I also extended his cardiac monitor beyond the initial 10 days.  I recommended the addition of fenofibrate 145 mg in light of his significant triglyceride elevation and also suggested initiation of metformin 500 mg daily with plans for primary care followup.  The subsequent CardioNet monitor did not demonstrate any further episodes of PAF on his increased beta blocker therapy although he did have some sinus tachycardia during activity.  Repeat blood work was significantly improved with his fasting glucose improved from 165, and remotely, 202.  Liver function studies were normal.  Renal function was normal.  In August 2015 he had continued to be very dedicated both with exercise, as well as improved diet.  He was walking 3-4 miles per day and lost approximately 25 pounds.  He was unaware of any arrhythmia.  He denies chest pain or shortness of breath.  Followup blood work revealed marked improvement in his hemoglobin A1c from 8.0-6.1.  His lipid studies were markedly improved with triglycerides being reduced to 96 from 398.  An NMR lipoprotein showed an LDL particle number was excellent at 799, with calculated LDL 78.  HDL particle number was 37.9.  He was no longer in resistant with an insulin resistance score 37.    When  I saw him  in February 2016 he complained that his chest would quiver on the left and he would notice some occasional pounding from within.  He was uncertain if he was having potential breakthrough atrial fibrillation or for these were just muscle spasms.  They have been occurring daily.  The episodes would last 5-6 seconds and then resolve.  He denied  associated presyncope or syncope or nocturnal symptoms.  At that time, I felt most likely this symptomatology was due to pectoralis muscle fasciculations rather than recurrent AF.  He wore a 2 week event monitor from 04/19/2014 through 05/02/2014.  This did not demonstrate any significant arrhythmia, but showed predominantly sinus rhythm with sinus bradycardia and rare PACs and rare isolated PVCs.  Assessment laboratory was also done which I reviewed with him in detail today.  His hemoglobin and hematocrit were stable.  Fasting glucose was mildly increased at 126.  Normal renal function and electrolytes.  TSH was borderline increased at 4.7.  His studies were normal.  Lipid studies continue to be excellent with a total cholesterol of 171, triglycerides 90, HDL 57, and LDL 96, on his current therapy.    When I saw him in September 2016, he was doing  well.  He went to [place] and [place].   Later he underwent arthroscopic knee surgery which took him 3-4 months to recover.  As result, his exercise has been reduced.  He is unaware of any recurrent arrhythmia.  In September 2016 LDL cholesterol was 88.  Total cholesterol 160, triglycerides 102.   Blood work from May 2017 had shown an increase in his hemoglobin A1c to 7.0.  Over the past 6 months, he has made a conscious effort to have improved diet.  He is exercising regularly.  He denies chest pain.  He denies palpitations.  He underwent repeat blood work last week which showed a normal CBC.  Hemoglobin A1c has improved to 6.5.  Total cholesterol was 178, triglycerides 102, his LDL was 95.  He has continued to be on fenofibrate 145 mg and pravastatin 40 mg.    I  saw him in October 2018 at which time he was remaining stable.  He was active and exercise regularly.  He had been successful with weight loss and his weight is down to 202 pounds. He traveled to [place] and the [employer].  He has had some issues with his left knee and was contemplating  surgery but ultimately this was aborted.  He is unaware of any recurrent atrial fibrillation.   Dr. Rosana Hoes checked his blood work and his hemoglobin A1c was 6.3.  He was on Toprol-XL 50 mg with control of palpitations or arrhythmia; eliquis for anticoagulation.;combination rosuvastatin 10 mg and fenofibrate 145 mg for mixed hyperlipidemia.  He was on Proscar 5 mg for his prostate and Flomax.    I saw him in October 2019 at which time he continued to remain stable from a cardiac standpoint.  He had recently returned from a three-week trip to [place] and had gained 5 pounds during the excursion.  He denied any chest pain or palpitations.  Repeat laboratory from January 08, 2018 showed total cholesterol 142, triglycerides 94, HDL 64, LDL 59.  Hemoglobin hematocrit were stable.  Glucose was increased at 141.  He had normal renal function and LFTs.  TSH was minimally increased at 5.2.  Hemoglobin A1c was slightly elevated at 6.7.    I last saw him in December 2020 at which time he  had remained cardiovascularly stable.  He has had purposeful weight loss and compared to his peak weight he has lost over 40 pounds.  Since his prior evaluation he had lost 16 pounds.  He denies any chest pain.  He is unaware of any recurrent atrial fibrillation.  He has had difficulty with his blood sugar and is eating a proper diet.  Laboratory in August 2020 had shown his hemoglobin A1c increased at 8.1.  He had follow-up laboratory on February 15, 2019.  Fasting glucose was 153.  Renal function was normal with a BUN of 18 creatinine 0.91.  He was not anemic.  Lipid studies were excellent with total cholesterol 142, triglycerides 91, HDL 64, and LDL 61 on rosuvastatin 10 mg in addition to fenofibrate.  He continues to be on apixaban and denies any bleeding.  He has been taking Toprol-XL 50 mg daily and denies palpitations.   Since I last saw him, he was evaluated by Coletta Memos, NP in August 2021 for preoperative cardiac evaluation at  that time he remained physically active and was walking 2 miles per day and doing yard work.  He was unaware of any recurrent palpitations or atrial fibrillation.  Recently, he was reevaluated by Dr. Brigitte Pulse his primary physician.  Over the past year, his hemoglobin A1c has increased and in June 2021 was 7.5, was slightly improved in September at 6.9, but most recently on July 04, 2019 had risen to 9.1.  He is scheduled to initiate therapy with Ozempic next week at [facility].  He denies any chest pain.  He continues to walk daily.  He denies any palpitations or awareness of atrial fibrillation.  Recent lipid studies showed total cholesterol 168 triglycerides 102 HDL 60 LDL 88 and he has been on rosuvastatin 10 mg, fenofibrate 145 mg.  His blood pressure has been stable on metoprolol XL 50 mg daily and he is on Eliquis 5 mg twice a day with his history of PAF.  He presents for evaluation.  Past Medical History:  Diagnosis Date  . Diabetes mellitus without complication ([place])   . Enlarged prostate   . Headache(784.0)    occ.  Marland Kitchen History of asthma    as a child  . Hyperlipidemia   . Hypertension   . Sleep apnea    2013    Past Surgical History:  Procedure Laterality Date  . APPENDECTOMY  1968  . CARDIAC CATHETERIZATION  09/06/1997   normal LV function, normal coronaries (Dr. Shelva Majestic)  . KNEE ARTHROSCOPY  1990'   with baker's cyst excision unsure side  . NASAL SEPTOPLASTY W/ TURBINOPLASTY Bilateral 05/25/2013   Procedure: NASAL SEPTOPLASTY WITH TURBINATE REDUCTION;  Surgeon: Jodi Marble, MD;  Location: [facility];  Service: ENT;  Laterality: Bilateral;  . NASOPHARYNGOSCOPY  1972   cyst removal  . NM MYOCAR PERF WALL MOTION  03/2006   bruce myoview - mild-mod perfusion defect in basal inferoseptal walls with mild reversibility at rest; mild to mod defect in mid inferior wall with mild reversibility at rest; EF 63%; intermediate risk scan  . TONSILLECTOMY     as a child  .  TRANSTHORACIC ECHOCARDIOGRAM  03/2006   EF=>55%, mild conc LVH; borderline RV/RA enlargement; borderline MVP, prolapse of anterior leaflets, mild MR; mild TR; AV mildly sclerotic  . UVULECTOMY N/A 05/25/2013   Procedure: Myrtis Ser ASSISTED;  Surgeon: Jodi Marble, MD;  Location: [facility];  Service: ENT;  Laterality: N/A;  . [place]  x 2    No Known Allergies  Current Outpatient Medications  Medication Sig Dispense Refill  . apixaban (ELIQUIS) 5 MG TABS tablet TAKE 1 TABLET(5 MG) BY MOUTH TWICE DAILY 180 tablet 3  . fenofibrate (TRICOR) 145 MG tablet TAKE 1 TABLET(145 MG) BY MOUTH DAILY 90 tablet 2  . finasteride (PROSCAR) 5 MG tablet Take 5 mg by mouth daily.    . metoprolol succinate (TOPROL-XL) 50 MG 24 hr tablet TAKE 1 TABLET BY MOUTH EVERY DAY WITH OR IMMEDIATELY FOLLOWING A MEAL. 90 tablet 3  . Multiple Vitamins-Minerals (MULTIVITAMIN WITH MINERALS) tablet Take 1 tablet by mouth daily.    . rosuvastatin (CRESTOR) 20 MG tablet Take 1 tablet (20 mg total) by mouth daily. 90 tablet 3  . tamsulosin (FLOMAX) 0.4 MG CAPS capsule Take 0.4 mg by mouth daily.     No current facility-administered medications for this visit.    Socially he is married. He has 3 children 4 grandchildren. There is no tobacco use. He did smoke for short-term and quit in 1969. He does drink alcohol.  Family History  Problem Relation Age of Onset  . Kidney failure Mother   . Emphysema Father   . Heart disease Other   . Breast cancer Sister   . Multiple myeloma Sister      ROS General: Negative; No fevers, chills, or night sweats; positive for purposeful 6 pound weight loss HEENT: Negative; No changes in vision or hearing, sinus congestion, difficulty swallowing Pulmonary: Negative; No cough, wheezing, shortness of breath, hemoptysis Cardiovascular: See history of present illness GI: Negative; No nausea, vomiting, diarrhea, or abdominal pain GU: Negative; No dysuria, hematuria, or  difficulty voiding Musculoskeletal: Negative; no myalgias, joint pain, or weakness Hematologic/Oncology: Negative; no easy bruising, bleeding Endocrine: Positive for increasing hemoglobin A1c consistent with overt diabetes mellitus Neuro: Negative; no changes in balance, headaches Skin: Negative; No rashes or skin lesions Psychiatric: Negative; No behavioral problems, depression Sleep: Positive for sleep apnea, currently not on CPAP, but status post recent ENT surgery; No snoring, daytime sleepiness, hypersomnolence, bruxism, restless legs, hypnogognic hallucinations, no cataplexy Other comprehensive 14 point system review is negative.   PE BP 126/77   Pulse (!) 56   Ht 6' (1.829 m)   Wt 210 lb (95.3 kg)   SpO2 97%   BMI 28.48 kg/m    Repeat blood pressure by me was 122/76  Wt Readings from Last 3 Encounters:  07/17/20 210 lb (95.3 kg)  11/04/19 209 lb (94.8 kg)  02/18/19 205 lb 3.2 oz (93.1 kg)   General: Alert, oriented, no distress.  Skin: normal turgor, no rashes, warm and dry HEENT: Normocephalic, atraumatic. Pupils equal round and reactive to light; sclera anicteric; extraocular muscles intact; Nose without nasal septal hypertrophy Mouth/Parynx benign; Mallinpatti scale 3 Neck: No JVD, no carotid bruits; normal carotid upstroke Lungs: clear to ausculatation and percussion; no wheezing or rales Chest wall: without tenderness to palpitation Heart: PMI not displaced, RRR, s1 s2 normal, 1/6 systolic murmur, no diastolic murmur, no rubs, gallops, thrills, or heaves Abdomen: soft, nontender; no hepatosplenomehaly, BS+; abdominal aorta nontender and not dilated by palpation. Back: no CVA tenderness Pulses 2+ Musculoskeletal: full range of motion, normal strength, no joint deformities Extremities: no clubbing cyanosis or edema, Homan's sign negative  Neurologic: grossly nonfocal; Cranial nerves grossly wnl Psychologic: Normal mood and affect   ECG (independently read by  me): Sinus bradycardia 56 bpm.  No significant ST-T abnormalities.  Normal intervals.  No ectopy  December  2020 ECG (independently read by me): Sinus bradycardia 58 bpm.  Nonspecific ST changes.  Normal intervals.  No ectopy.  October 2019 ECG (independently read by me): Normal sinus rhythm at 60 bpm.  PAC.  Nonspecific ST changes.  October 2018 ECG (independently read by me): sinus bradycardia at 62 bpm.  Premature ventricular contractions.  Normal intervals.  No ST segment changes.  December 2017 ECG (independently read by me): Normal sinus rhythm at 68 bpm.  Rare PAC, occasional PVC.  Nonspecific ST changes.  April 2017 ECG (independently read by me): Normal sinus rhythm with PAC pattern of atrial bigeminy  September 2016 ECG (independently read by me): Sinus rhythm with PAC's, atrial bigeminy for which he is entirely asymptomatic.  05/10/2014 ECG (independently read by me): Sinus rhythm with sinus arrhythmia and  PACs with an atrial bigeminal rhythm  04/19/2014 ECG (independently read by me): Normal sinus rhythm at 61 bpm.  One isolated PVC.  No significant ST segment changes.  Normal intervals.  Prior ECG (independently read by me) normal sinus rhythm at 64 beats per minute.  Normal intervals.  No ST segment changes.  ECG on 06/23/13 (independently read by me) normal sinus rhythm at 63 beats per minute.  PR interval 150 ms, QTc interval 433 ms  Prior 06/15/2013 ECG (independently read by me): Normal sinus rhythm at 72 beats per minute. PR interval 144 ms; QTc interval 451 ms  LABS:  BMP Latest Ref Rng & Units 02/15/2019 01/12/2018 02/25/2016  Glucose 65 - 99 mg/dL 153(H) 141(H) 154(H)  BUN 8 - 27 mg/dL _0 Creatinine 0.76 - 1.27 mg/dL 0.91 0.96 0.94  BUN/Creat Ratio 10 - _1 -  Sodium 134 - 144 mmol/L 139 140 139  Potassium 3.5 - 5.2 mmol/L 4.6 4.9 4.7  Chloride 96 - 106 mmol/L 104 102 105  CO2 20 - 29 mmol/L _2 Calcium 8.6 - 10.2 mg/dL 9.6 9.6 9.3    Hepatic  Function Latest Ref Rng & Units 02/15/2019 01/12/2018 02/25/2016  Total Protein 6.0 - 8.5 g/dL 6.6 6.7 6.6  Albumin 3.7 - 4.7 g/dL 4.4 4.6 4.2  AST 0 - 40 IU/L _3 ALT 0 - 44 IU/L _4 Alk Phosphatase 39 - 117 IU/L 40 34(L) 32(L)  Total Bilirubin 0.0 - 1.2 mg/dL 0.5 0.4 0.5    CBC Latest Ref Rng & Units 02/15/2019 01/12/2018 02/25/2016  WBC 3.4 - 10.8 x10E3/uL 6.5 7.1 6.6  Hemoglobin 13.0 - 17.7 g/dL 13.4 13.7 14.1  Hematocrit 37.5 - 51.0 % 41.5 41.6 42.9  Platelets 150 - 450 x10E3/uL 235 231 244   Lab Results  Component Value Date   MCV 94 02/15/2019   MCV 90 01/12/2018   MCV 90.1 02/25/2016   Lab Results  Component Value Date   TSH 4.380 02/15/2019   Lab Results  Component Value Date   HGBA1C 6.7 (H) 01/12/2018   No results found for: PROBNP  Lipid Panel     Component Value Date/Time   CHOL 142 02/15/2019 0830   CHOL 152 10/25/2013 0804   TRIG 91 02/15/2019 0830   TRIG 96 10/25/2013 0804   HDL 64 02/15/2019 0830   HDL 55 10/25/2013 0804   CHOLHDL 2.2 02/15/2019 0830   CHOLHDL 2.8 02/25/2016 0803   VLDL 20 02/25/2016 0803   LDLCALC 61 02/15/2019 0830   LDLCALC 78 10/25/2013 0804    NMR Profile 10/25/2013   Ref Range 26moago  25moago      LDL Particle Number <1000 nmol/L 799    Comments: Reference Range  Low < 1000  Moderate 1000 - 1299  Borderline-High 1300 - 1599  High 1600 - 2000  Very High > 2000     LDL (calc) <100 mg/dL 78 72R, CM   Comments: LDL-C is inaccurate if patient is nonfasting. Reference Range  Optimal < 100  Near/Above Optimal 100 - 129  Borderline High 130 - 159  High 160 - 189  Very High >= 190     HDL-C >=40 mg/dL 55     Triglycerides <150 mg/dL 96 398 (H)    Cholesterol, Total <200 mg/dL 152     HDL Particle Number >=30.5 umol/L 37.9     Large HDL-P >=4.8 umol/L 7.6     Large VLDL-P <=2.7 nmol/L 2.5     Small LDL Particle Number <=527 nmol/L 275     LDL Size >20.5 nm 20.9     HDL Size >=9.2  nm 9.0 (L)     VLDL Size <=46.6 nm 41.4     LP-IR Score <=45  37       RADIOLOGY: No results found.   IMPRESSION:  1. PAF (paroxysmal atrial fibrillation) ([place]   2. Essential hypertension   3. Mixed hyperlipidemia   4. Type 2 diabetes mellitus without complication, without long-term current use of insulin ([place]   5. Anticoagulation adequate     ASSESSMENT AND PLAN: Mr. JSederick Jacobsenis a young appearing 89ear-old gentleman who had  normal coronary arteries and normal LV function at cardiac catheterization [phone] a nuclear perfusion study raised the possibility of subtle ischemia. In 2015, he had developed overt diabetes, and also had episodes of paroxysmal atrial fibrillation.  With significant lifestyle adjustment he initially lost approximately 25 pounds, his blood pressure  normalized and his hemoglobin A1c significantly improved.  He subsequently has lost almost 40 pounds and his weight today is 210 not significantly changed from when I last saw him in 2020 when his weight was 205.  However, despite being very careful with his diet, it appears that he has developed progressive diabetes mellitus with most recent hemoglobin A1c increasing to 9.1.  [facility]  With his overt diabetes, I have recommended target LDL cholesterol less than 70.  His most recent LDL cholesterol is 88 which has risen from 665in December 2020 and 70 from February 2021.  I have recommended further titration of rosuvastatin to 20 mg daily.  He is unaware of any recurrent atrial fibrillation and he continues to be on Eliquis 5 mg twice daily for anticoagulation.  He will be turning 80 later this year.  However with his weight and normal renal function he would still maintain the 5 mg twice daily dose.  We discussed the importance of continued proper diet and exercise.  He is not having any chest pain symptomatology.  He plans to resume  significant travel in the future.  He is sleeping well.  His last echo Doppler study was in 2015.  I have recommended a follow-up echo Doppler study prior to his next evaluation.  I will see him in 1 year for reevaluation.   TTroy Sine MD, [facility]5/07/2020 10:30 AM

## 2020-07-18 DIAGNOSIS — R351 Nocturia: Secondary | ICD-10-CM | POA: Diagnosis not present

## 2020-07-18 DIAGNOSIS — N401 Enlarged prostate with lower urinary tract symptoms: Secondary | ICD-10-CM | POA: Diagnosis not present

## 2020-07-18 DIAGNOSIS — R350 Frequency of micturition: Secondary | ICD-10-CM | POA: Diagnosis not present

## 2020-07-19 ENCOUNTER — Encounter: Payer: Self-pay | Admitting: Cardiovascular Disease

## 2020-07-26 DIAGNOSIS — I10 Essential (primary) hypertension: Secondary | ICD-10-CM | POA: Diagnosis not present

## 2020-07-26 DIAGNOSIS — E1149 Type 2 diabetes mellitus with other diabetic neurological complication: Secondary | ICD-10-CM | POA: Diagnosis not present

## 2020-08-22 IMAGING — US US AORTA
2 series · 14 of 25 positions shown · non-contrast
Comparison: None.

CLINICAL DATA: History of tobacco use

EXAM:
ULTRASOUND OF ABDOMINAL AORTA
TECHNIQUE: Ultrasound examination of the abdominal aorta and proximal common
iliac arteries was performed to evaluate for aneurysm. Additional
color and Doppler images of the distal aorta were obtained to
document patency.

[Series 1: us aorta · 0.22mm/px · 13 of 24 slices shown (1 of 2)]
[im 1/24]
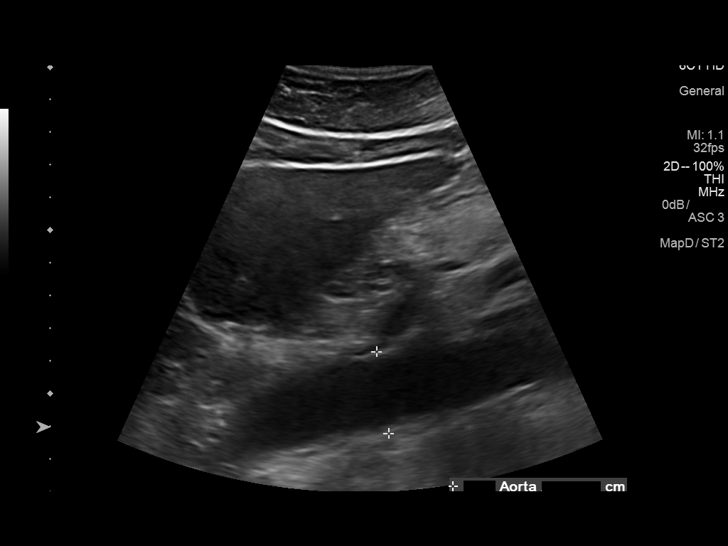
[im 3/24]
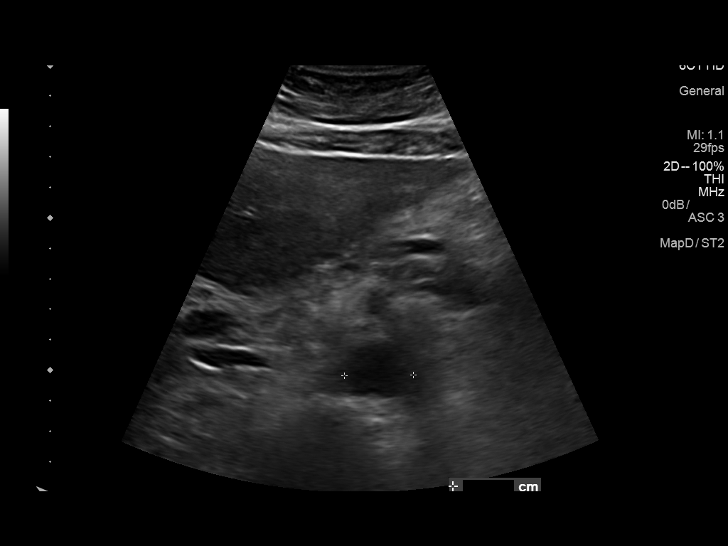
[im 5/24]
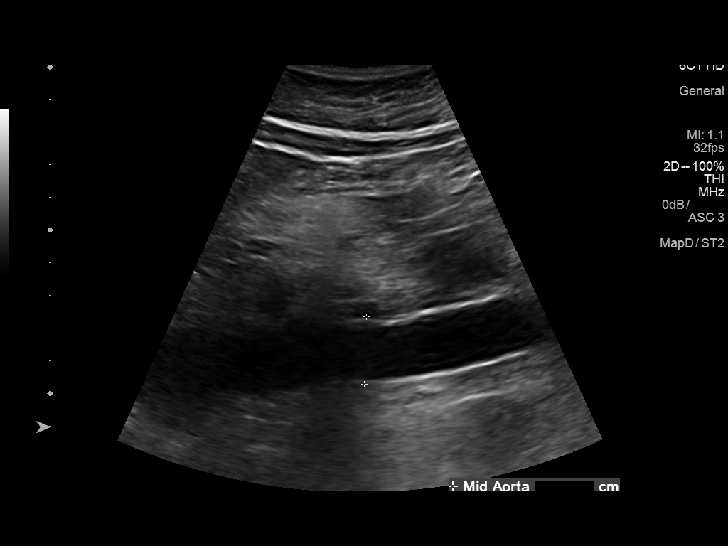
[im 7/24]
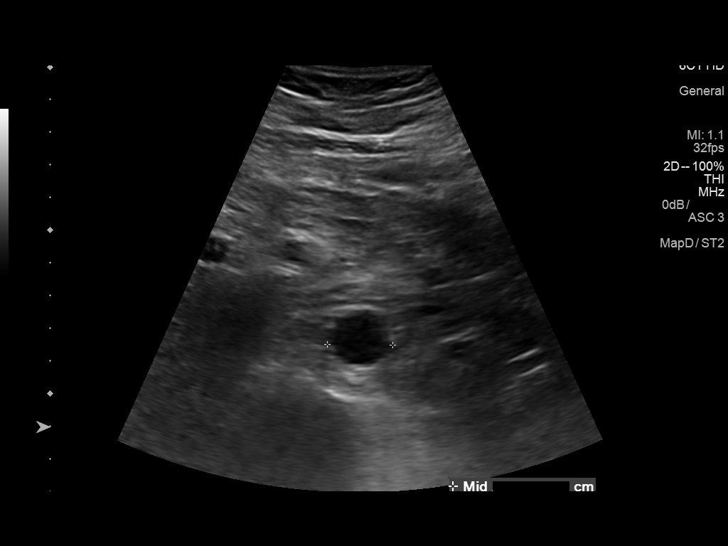
[im 9/24]
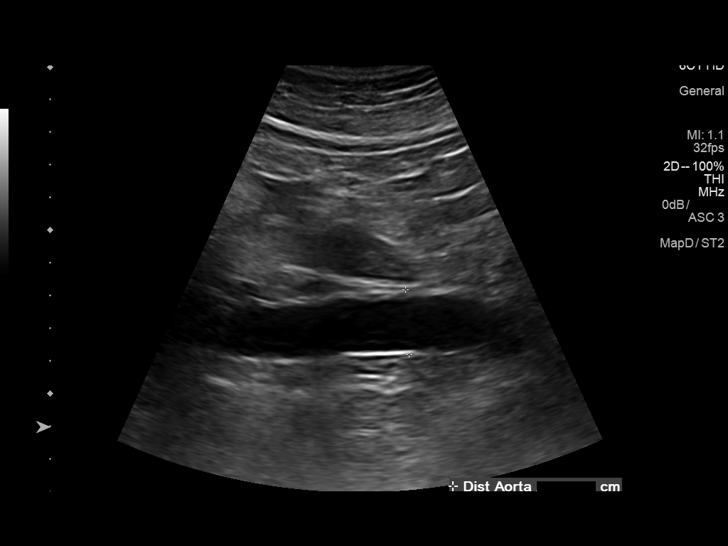
[im 10/24]
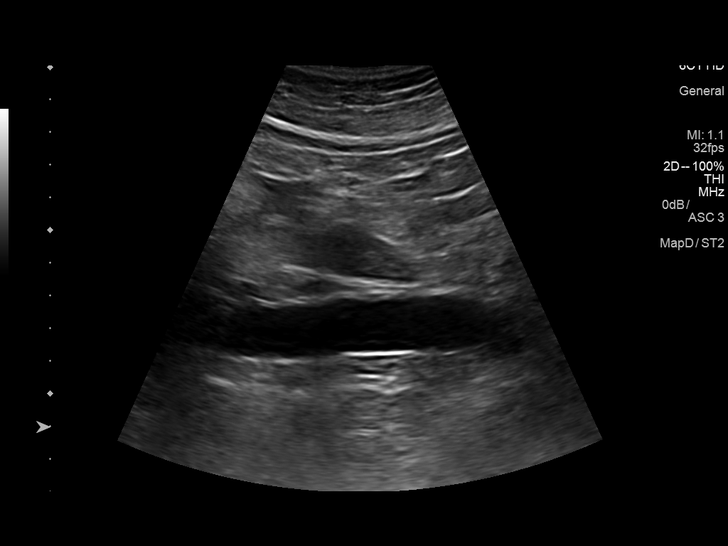
[im 12/24]
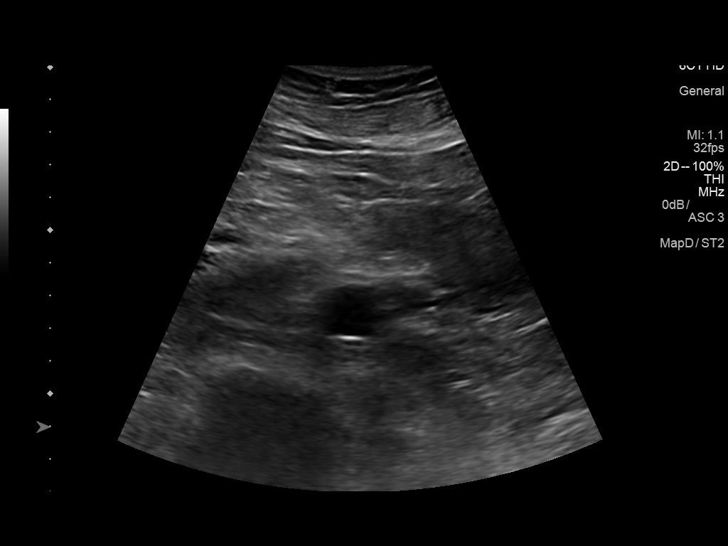
[im 14/24]
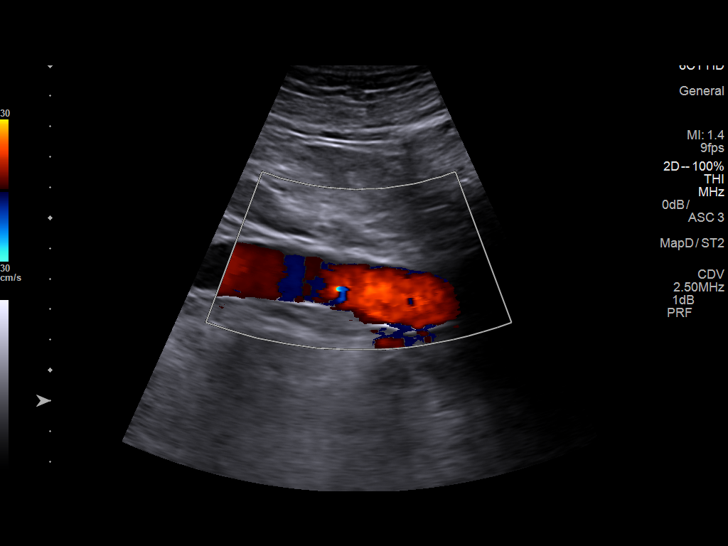
[im 16/24]
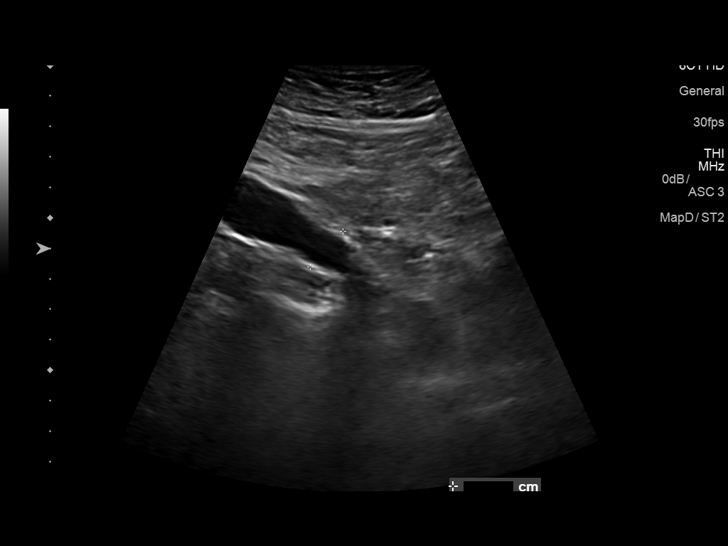
[im 17/24]
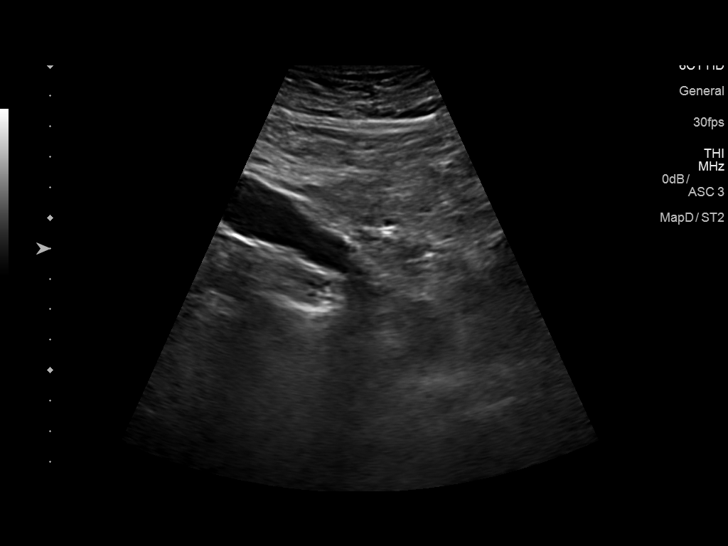
[im 19/24]
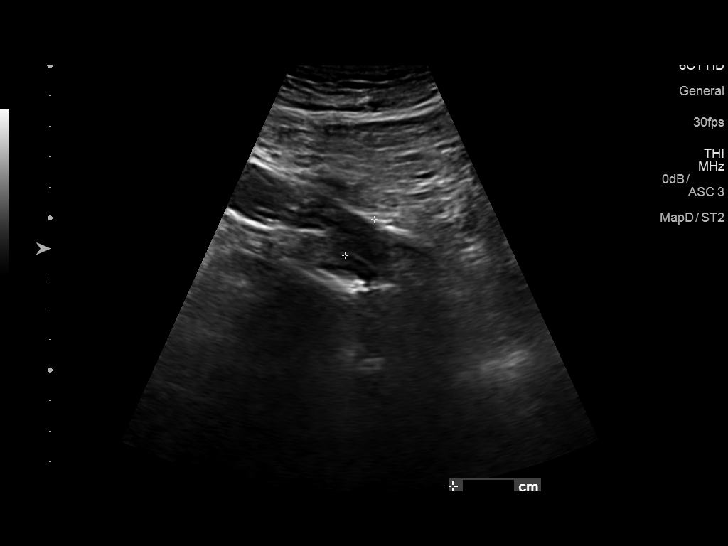
[im 21/24]
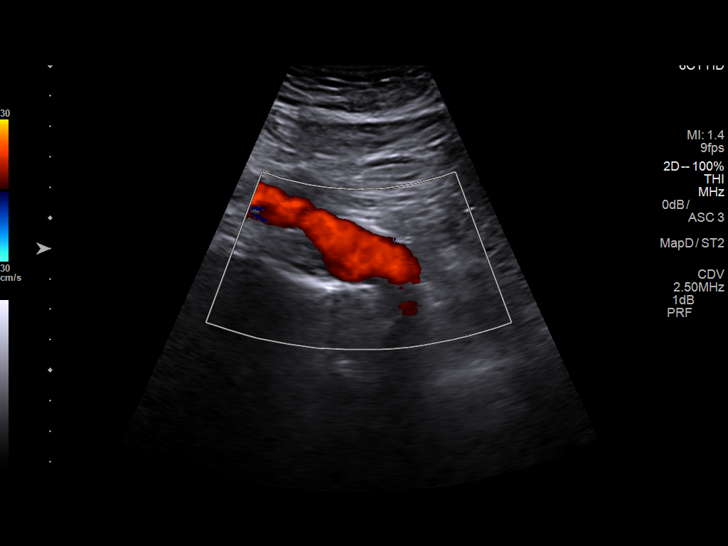
[im 23/24]
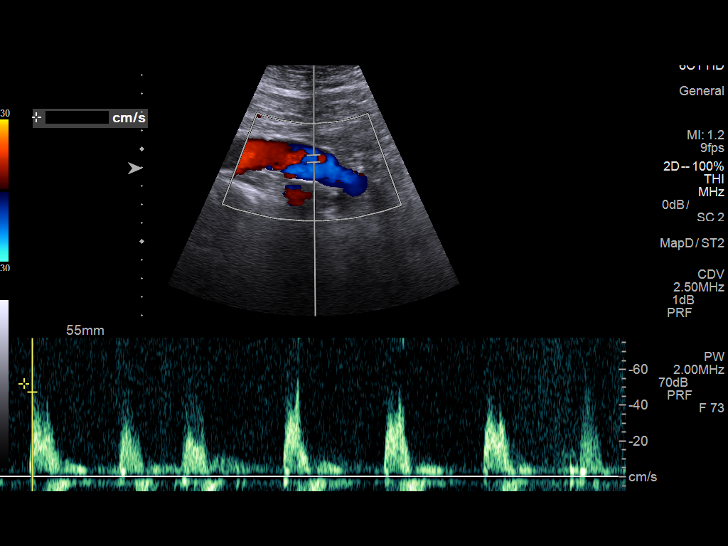

[Series 2001: us aorta · 0.23mm/px · 1 of 1 slices shown (2 of 2)]
[im 1/1]
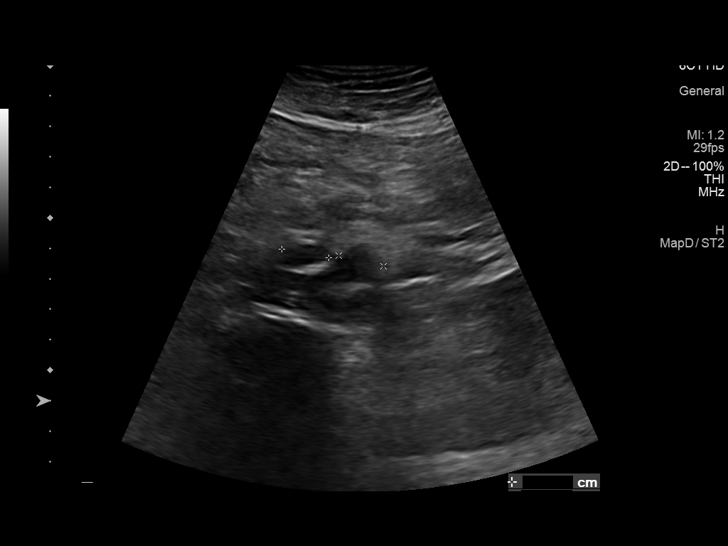

[14 of 25 positions shown; findings below may reference images not displayed]

FINDINGS: Abdominal aortic measurements as follows:

Proximal:  2.5 cm

Mid:  2.1 cm

Distal:  2.0 cm
Patent: Yes, peak systolic velocity is 49 cm/s

Right common iliac artery: 1.6 cm

Left common iliac artery: 1.6 cm
IMPRESSION: No evidence of aortic aneurysm is identified.

## 2020-09-04 DIAGNOSIS — M170 Bilateral primary osteoarthritis of knee: Secondary | ICD-10-CM | POA: Diagnosis not present

## 2020-09-20 DIAGNOSIS — M170 Bilateral primary osteoarthritis of knee: Secondary | ICD-10-CM | POA: Diagnosis not present

## 2020-10-08 ENCOUNTER — Other Ambulatory Visit: Payer: Self-pay | Admitting: Cardiovascular Disease

## 2020-10-16 DIAGNOSIS — C44519 Basal cell carcinoma of skin of other part of trunk: Secondary | ICD-10-CM | POA: Diagnosis not present

## 2020-10-16 DIAGNOSIS — Z85828 Personal history of other malignant neoplasm of skin: Secondary | ICD-10-CM | POA: Diagnosis not present

## 2020-10-16 DIAGNOSIS — L570 Actinic keratosis: Secondary | ICD-10-CM | POA: Diagnosis not present

## 2020-10-16 DIAGNOSIS — L821 Other seborrheic keratosis: Secondary | ICD-10-CM | POA: Diagnosis not present

## 2020-10-16 DIAGNOSIS — L814 Other melanin hyperpigmentation: Secondary | ICD-10-CM | POA: Diagnosis not present

## 2020-10-18 ENCOUNTER — Telehealth: Payer: Self-pay | Admitting: Cardiovascular Disease

## 2020-10-18 NOTE — Telephone Encounter (Signed)
[facility] calling about fax they sent over for pt to receive discount on Rosuvastatin and Metoprolol , checked chart and didn't see fax, please advise

## 2020-10-18 NOTE — Telephone Encounter (Addendum)
Returned call to [place] from [employer], who was calling to check if Dr. Tresa Endo is willing to change pt's rosuvastatin to 1/2 of a 40 mg tablet daily rather than 20mg  tablets, and possibly changing pt's metoprolol succinate to atenolol in order to decrease cost for the patient. This RN did receive fax from [facility] about changing rosuvastatin. Dr. signed, but information was not complete when faxed, so pharmacy faxed form back for Dr. Tresa Endo to fill in the remaining information before changing.   This RN called patient, asked patient about his desire to change medications. Pt reports he does not want to change any of his medications at this time and is satisfied with the cost of his prescriptions. This RN called [facility] back to notify that pt is not interested in changing medications at this time. This RN notified pharmacy that a new form with complete information would not be faxed, as the patient is not interested.

## 2020-11-28 DIAGNOSIS — E1149 Type 2 diabetes mellitus with other diabetic neurological complication: Secondary | ICD-10-CM | POA: Diagnosis not present

## 2020-11-28 DIAGNOSIS — I10 Essential (primary) hypertension: Secondary | ICD-10-CM | POA: Diagnosis not present

## 2020-12-01 ENCOUNTER — Other Ambulatory Visit: Payer: Self-pay | Admitting: Cardiovascular Disease

## 2021-02-27 ENCOUNTER — Other Ambulatory Visit (HOSPITAL_COMMUNITY): Payer: Self-pay

## 2021-02-27 MED ORDER — OZEMPIC (1 MG/DOSE) 4 MG/3ML ~~LOC~~ SOPN
1.0000 mg | PEN_INJECTOR | SUBCUTANEOUS | 4 refills | Status: DC
  Filled 2021-02-27: qty 3, 28d supply, fill #0

## 2021-03-19 DIAGNOSIS — L570 Actinic keratosis: Secondary | ICD-10-CM | POA: Diagnosis not present

## 2021-03-19 DIAGNOSIS — L820 Inflamed seborrheic keratosis: Secondary | ICD-10-CM | POA: Diagnosis not present

## 2021-03-21 DIAGNOSIS — R0989 Other specified symptoms and signs involving the circulatory and respiratory systems: Secondary | ICD-10-CM | POA: Diagnosis not present

## 2021-03-21 DIAGNOSIS — R490 Dysphonia: Secondary | ICD-10-CM | POA: Diagnosis not present

## 2021-03-25 ENCOUNTER — Other Ambulatory Visit (HOSPITAL_COMMUNITY): Payer: Self-pay

## 2021-03-25 MED ORDER — OZEMPIC (1 MG/DOSE) 4 MG/3ML ~~LOC~~ SOPN
1.0000 mg | PEN_INJECTOR | SUBCUTANEOUS | 4 refills | Status: DC
  Filled 2021-03-25: qty 9, 84d supply, fill #0

## 2021-07-04 DIAGNOSIS — M170 Bilateral primary osteoarthritis of knee: Secondary | ICD-10-CM | POA: Diagnosis not present

## 2021-07-11 DIAGNOSIS — M170 Bilateral primary osteoarthritis of knee: Secondary | ICD-10-CM | POA: Diagnosis not present

## 2021-07-12 DIAGNOSIS — Z125 Encounter for screening for malignant neoplasm of prostate: Secondary | ICD-10-CM | POA: Diagnosis not present

## 2021-07-12 DIAGNOSIS — E1149 Type 2 diabetes mellitus with other diabetic neurological complication: Secondary | ICD-10-CM | POA: Diagnosis not present

## 2021-07-12 DIAGNOSIS — E785 Hyperlipidemia, unspecified: Secondary | ICD-10-CM | POA: Diagnosis not present

## 2021-07-15 DIAGNOSIS — R946 Abnormal results of thyroid function studies: Secondary | ICD-10-CM | POA: Diagnosis not present

## 2021-07-16 ENCOUNTER — Ambulatory Visit (HOSPITAL_COMMUNITY): Payer: BC Managed Care – PPO | Attending: Internal Medicine

## 2021-07-16 DIAGNOSIS — Z1339 Encounter for screening examination for other mental health and behavioral disorders: Secondary | ICD-10-CM | POA: Diagnosis not present

## 2021-07-16 DIAGNOSIS — Z0000 Encounter for general adult medical examination without abnormal findings: Secondary | ICD-10-CM | POA: Diagnosis not present

## 2021-07-16 DIAGNOSIS — I10 Essential (primary) hypertension: Secondary | ICD-10-CM | POA: Diagnosis not present

## 2021-07-16 DIAGNOSIS — I480 Paroxysmal atrial fibrillation: Secondary | ICD-10-CM | POA: Diagnosis not present

## 2021-07-16 DIAGNOSIS — Z1331 Encounter for screening for depression: Secondary | ICD-10-CM | POA: Diagnosis not present

## 2021-07-16 DIAGNOSIS — E782 Mixed hyperlipidemia: Secondary | ICD-10-CM

## 2021-07-16 DIAGNOSIS — R82998 Other abnormal findings in urine: Secondary | ICD-10-CM | POA: Diagnosis not present

## 2021-07-16 LAB — ECHOCARDIOGRAM COMPLETE
Area-P 1/2: 2.39 cm2
S' Lateral: 4.5 cm

## 2021-07-18 DIAGNOSIS — M170 Bilateral primary osteoarthritis of knee: Secondary | ICD-10-CM | POA: Diagnosis not present

## 2021-08-07 ENCOUNTER — Ambulatory Visit (INDEPENDENT_AMBULATORY_CARE_PROVIDER_SITE_OTHER): Payer: BC Managed Care – PPO | Admitting: Cardiovascular Disease

## 2021-08-07 ENCOUNTER — Encounter: Payer: Self-pay | Admitting: Cardiovascular Disease

## 2021-08-07 VITALS — BP 112/66 | HR 62 | Ht 73.0 in | Wt 184.8 lb

## 2021-08-07 DIAGNOSIS — I480 Paroxysmal atrial fibrillation: Secondary | ICD-10-CM

## 2021-08-07 DIAGNOSIS — I10 Essential (primary) hypertension: Secondary | ICD-10-CM | POA: Diagnosis not present

## 2021-08-07 NOTE — Progress Notes (Signed)
Patient ID: Victor Sanchez, male   DOB: 09-10-1940, 81 y.o.   MRN: [id number]    Primary MD: Dr. Osborne Casco  HPI: Mr. Berley Gambrell is a 81 year old white male who presents for an 54 month follow-up cardiology evaluation.  Mr. Hedeen developed atypical chest pain in 1999. A nuclear perfusion study raised the possibility of focal inferior scar with possible inferolateral redistribution. Cardiac catheterization revealed normal LV function and normal coronary arteries. In 2008 a nuclear study showed diaphragmatic attenuation although the ormal interpretation was interpreted as possible ischemia. In January 2015 he developed URI symptoms and had  significant difficulty with sleep. He also had significant deviated septum, and has had episodes with paroxysmal positional vertigo. As part of his preoperative evaluation for ENT surgery he was found to have possible abnormal heart rhythms. He underwent his ENT surgery for with a nasal septoplasty, reduction of his inferior turbinates, and LAUP. When Dr. Erik Obey saw him  he was concerned about the possibility of atrial fibrillation.  He reestablished care with me last year following this episode.  At that time prior to a planned trip to [place] for 2 weeks I recommended that he undergo laboratory as well as wear a CardioNet monitor.  Blood work was abnormal in that his total cholesterol was 203, triglycerides 398, HDL 51, VLDL 80 LDL 72.  Magnesium was normal.  His fasting glucose was 165 and the hemoglobin A1c was elevated at 8.0 indicative of a mean plasma glucose of approximately 183.  He had normal hemoglobin and hematocrit.  A 2-D echo Doppler study revealed an ejection fraction in the 55-60%.  On his monitor, he developed recurrent episodes of paroxysmal atrial fibrillation with rates in the low 100s, but in one instance  he developed a 10 beat run of PSVT approximately 178 beats per minute.  He was contacted regarding these results and was brought into the office on  06/23/2013.  An echo Doppler  revealed mild aortic valve sclerosis, mild tricuspid regurgitation, and mild MR, with normal systolic function.  His laboratory was suggestive of type 2 diabetes mellitus.  With his episodes of PAF noted on his monitor.  I recommended institution of low-dose Toprol initially at 25 mg and titrate this to 50 mg.  I also extended his cardiac monitor beyond the initial 10 days.  I recommended the addition of fenofibrate 145 mg in light of his significant triglyceride elevation and also suggested initiation of metformin 500 mg daily with plans for primary care followup.  The subsequent CardioNet monitor did not demonstrate any further episodes of PAF on his increased beta blocker therapy although he did have some sinus tachycardia during activity.  Repeat blood work was significantly improved with his fasting glucose improved from 165, and remotely, 202.  Liver function studies were normal.  Renal function was normal.  In August 2015 he had continued to be very dedicated both with exercise, as well as improved diet.  He was walking 3-4 miles per day and lost approximately 25 pounds.  He was unaware of any arrhythmia.  He denies chest pain or shortness of breath.  Followup blood work revealed marked improvement in his hemoglobin A1c from 8.0-6.1.  His lipid studies were markedly improved with triglycerides being reduced to 96 from 398.  An NMR lipoprotein showed an LDL particle number was excellent at 799, with calculated LDL 78.  HDL particle number was 37.9.  He was no longer in resistant with an insulin resistance score 37.    When  I saw him  in February 2016 he complained that his chest would quiver on the left and he would notice some occasional pounding from within.  He was uncertain if he was having potential breakthrough atrial fibrillation or for these were just muscle spasms.  They have been occurring daily.  The episodes would last 5-6 seconds and then resolve.  He denied  associated presyncope or syncope or nocturnal symptoms.  At that time, I felt most likely this symptomatology was due to pectoralis muscle fasciculations rather than recurrent AF.  He wore a 2 week event monitor from 04/19/2014 through 05/02/2014.  This did not demonstrate any significant arrhythmia, but showed predominantly sinus rhythm with sinus bradycardia and rare PACs and rare isolated PVCs.  Assessment laboratory was also done which I reviewed with him in detail today.  His hemoglobin and hematocrit were stable.  Fasting glucose was mildly increased at 126.  Normal renal function and electrolytes.  TSH was borderline increased at 4.7.  His studies were normal.  Lipid studies continue to be excellent with a total cholesterol of 171, triglycerides 90, HDL 57, and LDL 96, on his current therapy.    When I saw him in September 2016, he was doing  well.  He went to [place] and [place].   Later he underwent arthroscopic knee surgery which took him 3-4 months to recover.  As result, his exercise has been reduced.  He is unaware of any recurrent arrhythmia.  In September 2016 LDL cholesterol was 88.  Total cholesterol 160, triglycerides 102.   Blood work from May 2017 had shown an increase in his hemoglobin A1c to 7.0.  Over the past 6 months, he has made a conscious effort to have improved diet.  He is exercising regularly.  He denies chest pain.  He denies palpitations.  He underwent repeat blood work last week which showed a normal CBC.  Hemoglobin A1c has improved to 6.5.  Total cholesterol was 178, triglycerides 102, his LDL was 95.  He has continued to be on fenofibrate 145 mg and pravastatin 40 mg.    I saw him in October 2018 at which time he was remaining stable.  He was active and exercise regularly.  He had been successful with weight loss and his weight is down to 202 pounds. He traveled to [place] and the [employer].  He has had some issues with his left knee and was contemplating surgery  but ultimately this was aborted.  He is unaware of any recurrent atrial fibrillation.   Dr. Rosana Hoes checked his blood work and his hemoglobin A1c was 6.3.  He was on Toprol-XL 50 mg with control of palpitations or arrhythmia; eliquis for anticoagulation.;combination rosuvastatin 10 mg and fenofibrate 145 mg for mixed hyperlipidemia.  He was on Proscar 5 mg for his prostate and Flomax.    I saw him in October 2019 at which time he continued to remain stable from a cardiac standpoint.  He had recently returned from a three-week trip to [place] and had gained 5 pounds during the excursion.  He denied any chest pain or palpitations.  Repeat laboratory from January 08, 2018 showed total cholesterol 142, triglycerides 94, HDL 64, LDL 59.  Hemoglobin hematocrit were stable.  Glucose was increased at 141.  He had normal renal function and LFTs.  TSH was minimally increased at 5.2.  Hemoglobin A1c was slightly elevated at 6.7.    I saw him in December 2020 at which time he had remained  cardiovascularly stable.  He has had purposeful weight loss and compared to his peak weight he has lost over 40 pounds.  Since his prior evaluation he had lost 16 pounds.  He denies any chest pain.  He is unaware of any recurrent atrial fibrillation.  He has had difficulty with his blood sugar and is eating a proper diet.  Laboratory in August 2020 had shown his hemoglobin A1c increased at 8.1.  He had follow-up laboratory on February 15, 2019.  Fasting glucose was 153.  Renal function was normal with a BUN of 18 creatinine 0.91.  He was not anemic.  Lipid studies were excellent with total cholesterol 142, triglycerides 91, HDL 64, and LDL 61 on rosuvastatin 10 mg in addition to fenofibrate.  He continues to be on apixaban and denies any bleeding.  He has been taking Toprol-XL 50 mg daily and denies palpitations.   Since I last saw him, he was evaluated by Coletta Memos, NP in August 2021 for preoperative cardiac evaluation at that time he  remained physically active and was walking 2 miles per day and doing yard work.  He was unaware of any recurrent palpitations or atrial fibrillation.  I last saw him on Jul 17, 2020 and he had recently been  reevaluated by Dr. Brigitte Pulse his primary physician.  Over the past year, his hemoglobin A1c has increased and in June 2021 was 7.5, was slightly improved in September at 6.9, but most recently on July 04, 2019 had risen to 9.1.  He is scheduled to initiate therapy with Ozempic next week at [facility].  He denies any chest pain.  He continues to walk daily.  He denies any palpitations or awareness of atrial fibrillation.  Recent lipid studies showed total cholesterol 168 triglycerides 102 HDL 60 LDL 88 and he has been on rosuvastatin 10 mg, fenofibrate 145 mg.  His blood pressure has been stable on metoprolol XL 50 mg daily and he is on Eliquis 5 mg twice a day with his history of PAF.    Since I last saw him, he has had purposeful weight loss with his weight reducing from 210 down to 184 pounds.  He was started on Ozempic for diabetes in August 2022 which resulted in significant reduction in appetite.  He denies any chest pain or shortness of breath.  He is unaware of recurrent atrial fibrillation.  He underwent an echo Doppler study on Jul 16, 2021 which remained stable with a EF of 55 to 60% with normal diastolic function with minimal dilatation of his LV internal cavity.  He and his wife have been traveling and over the past year had gone to [place] and will be planning a trip to [place] in [place] and a future trip to [place].  He presents for evaluation.  Past Medical History:  Diagnosis Date   Diabetes mellitus without complication ([place])    Enlarged prostate    Headache(784.0)    occ.   History of asthma    as a child   Hyperlipidemia    Hypertension    Sleep apnea    2013    Past Surgical History:  Procedure Laterality Date   APPENDECTOMY  1968   CARDIAC CATHETERIZATION   09/06/1997   normal LV function, normal coronaries (Dr. Shelva Majestic)   KNEE ARTHROSCOPY  1990'   with baker's cyst excision unsure side   NASAL SEPTOPLASTY W/ TURBINOPLASTY Bilateral 05/25/2013   Procedure: NASAL SEPTOPLASTY WITH TURBINATE REDUCTION;  Surgeon: Jodi Marble,  MD;  Location: [facility];  Service: ENT;  Laterality: Bilateral;   NASOPHARYNGOSCOPY  1972   cyst removal   NM MYOCAR PERF WALL MOTION  03/2006   bruce myoview - mild-mod perfusion defect in basal inferoseptal walls with mild reversibility at rest; mild to mod defect in mid inferior wall with mild reversibility at rest; EF 63%; intermediate risk scan   TONSILLECTOMY     as a child   TRANSTHORACIC ECHOCARDIOGRAM  03/2006   EF=>55%, mild conc LVH; borderline RV/RA enlargement; borderline MVP, prolapse of anterior leaflets, mild MR; mild TR; AV mildly sclerotic   UVULECTOMY N/A 05/25/2013   Procedure: Myrtis Ser ASSISTED;  Surgeon: Jodi Marble, MD;  Location: [facility];  Service: ENT;  Laterality: N/A;   VENTRAL HERNIA REPAIR     x 2    No Known Allergies  Current Outpatient Medications  Medication Sig Dispense Refill   apixaban (ELIQUIS) 5 MG TABS tablet TAKE 1 TABLET(5 MG) BY MOUTH TWICE DAILY 180 tablet 3   fenofibrate (TRICOR) 145 MG tablet TAKE 1 TABLET(145 MG) BY MOUTH DAILY 90 tablet 2   finasteride (PROSCAR) 5 MG tablet Take 5 mg by mouth daily.     metoprolol succinate (TOPROL-XL) 50 MG 24 hr tablet TAKE 1 TABLET BY MOUTH EVERY DAY WITH OR IMMEDIATELY FOLLOWING A MEAL 90 tablet 3   Multiple Vitamins-Minerals (MULTIVITAMIN WITH MINERALS) tablet Take 1 tablet by mouth daily.     rosuvastatin (CRESTOR) 20 MG tablet Take 1 tablet (20 mg total) by mouth daily. 90 tablet 3   Semaglutide, 1 MG/DOSE, (OZEMPIC, 1 MG/DOSE,) 4 MG/3ML SOPN Inject 1 mg into the skin once a week. 9 mL 4   tamsulosin (FLOMAX) 0.4 MG CAPS capsule Take 0.4 mg by mouth daily.     No current facility-administered medications for this visit.     Socially he is married. He has 3 children 4 grandchildren. There is no tobacco use. He did smoke for short-term and quit in 1969. He does drink alcohol.  Family History  Problem Relation Age of Onset   Kidney failure Mother    Emphysema Father    Heart disease Other    Breast cancer Sister    Multiple myeloma Sister      ROS General: Negative; No fevers, chills, or night sweats; positive for purposeful 6 pound weight loss HEENT: Negative; No changes in vision or hearing, sinus congestion, difficulty swallowing Pulmonary: Negative; No cough, wheezing, shortness of breath, hemoptysis Cardiovascular: See history of present illness GI: Negative; No nausea, vomiting, diarrhea, or abdominal pain GU: Negative; No dysuria, hematuria, or difficulty voiding Musculoskeletal: Negative; no myalgias, joint pain, or weakness Hematologic/Oncology: Negative; no easy bruising, bleeding Endocrine: Positive for increasing hemoglobin A1c consistent with overt diabetes mellitus Neuro: Negative; no changes in balance, headaches Skin: Negative; No rashes or skin lesions Psychiatric: Negative; No behavioral problems, depression Sleep: Positive for sleep apnea, currently not on CPAP, but status post recent ENT surgery; No snoring, daytime sleepiness, hypersomnolence, bruxism, restless legs, hypnogognic hallucinations, no cataplexy Other comprehensive 14 point system review is negative.   PE BP 112/66   Pulse 62   Ht _0  (1.854 m)   Wt 184 lb 12.8 oz (83.8 kg)   SpO2 100%   BMI 24.38 kg/m    Repeat blood pressure by me was 122/60  Wt Readings from Last 3 Encounters:  08/07/21 184 lb 12.8 oz (83.8 kg)  07/17/20 210 lb (95.3 kg)  11/04/19 209 lb (94.8 kg)  General: Alert, oriented, no distress.  Skin: normal turgor, no rashes, warm and dry HEENT: Normocephalic, atraumatic. Pupils equal round and reactive to light; sclera anicteric; extraocular muscles intact;  Nose without nasal septal  hypertrophy Mouth/Parynx benign; Mallinpatti scale 3 Neck: No JVD, no carotid bruits; normal carotid upstroke Lungs: clear to ausculatation and percussion; no wheezing or rales Chest wall: without tenderness to palpitation Heart: PMI not displaced, RRR, s1 s2 normal, 1/6 systolic murmur, no diastolic murmur, no rubs, gallops, thrills, or heaves Abdomen: soft, nontender; no hepatosplenomehaly, BS+; abdominal aorta nontender and not dilated by palpation. Back: no CVA tenderness Pulses 2+ Musculoskeletal: full range of motion, normal strength, no joint deformities Extremities: no clubbing cyanosis or edema, Homan's sign negative  Neurologic: grossly nonfocal; Cranial nerves grossly wnl Psychologic: Normal mood and affect   Aug 07, 2021 ECG (independently read by me): Normal sinus rhythm at 62 bpm, PAC.  Normal intervals.  No ST segment changes  Jul 17, 2020 ECG (independently read by me): Sinus bradycardia 56 bpm.  No significant ST-T abnormalities.  Normal intervals.  No ectopy  December 2020 ECG (independently read by me): Sinus bradycardia 58 bpm.  Nonspecific ST changes.  Normal intervals.  No ectopy.  October 2019 ECG (independently read by me): Normal sinus rhythm at 60 bpm.  PAC.  Nonspecific ST changes.  October 2018 ECG (independently read by me): sinus bradycardia at 62 bpm.  Premature ventricular contractions.  Normal intervals.  No ST segment changes.  December 2017 ECG (independently read by me): Normal sinus rhythm at 68 bpm.  Rare PAC, occasional PVC.  Nonspecific ST changes.  April 2017 ECG (independently read by me): Normal sinus rhythm with PAC pattern of atrial bigeminy  September 2016 ECG (independently read by me): Sinus rhythm with PAC's, atrial bigeminy for which he is entirely asymptomatic.  05/10/2014 ECG (independently read by me): Sinus rhythm with sinus arrhythmia and  PACs with an atrial bigeminal rhythm  04/19/2014 ECG (independently read by me): Normal  sinus rhythm at 61 bpm.  One isolated PVC.  No significant ST segment changes.  Normal intervals.  Prior ECG (independently read by me) normal sinus rhythm at 64 beats per minute.  Normal intervals.  No ST segment changes.  ECG on 06/23/13 (independently read by me) normal sinus rhythm at 63 beats per minute.  PR interval 150 ms, QTc interval 433 ms  Prior 06/15/2013 ECG (independently read by me): Normal sinus rhythm at 72 beats per minute. PR interval 144 ms; QTc interval 451 ms  LABS:     Latest Ref Rng & Units 02/15/2019    8:30 AM 01/12/2018    8:15 AM 02/25/2016    9:51 AM  BMP  Glucose 65 - 99 mg/dL 153   141   154    BUN 8 - 27 mg/dL _0 Creatinine 0.76 - 1.27 mg/dL 0.91   0.96   0.94    BUN/Creat Ratio 10 - _1 Sodium 134 - 144 mmol/L 139   140   139    Potassium 3.5 - 5.2 mmol/L 4.6   4.9   4.7    Chloride 96 - 106 mmol/L 104   102   105    CO2 20 - 29 mmol/L _2 Calcium 8.6 - 10.2 mg/dL 9.6   9.6   9.3  Latest Ref Rng & Units 02/15/2019    8:30 AM 01/12/2018    8:15 AM 02/25/2016    9:51 AM  Hepatic Function  Total Protein 6.0 - 8.5 g/dL 6.6   6.7   6.6    Albumin 3.7 - 4.7 g/dL 4.4   4.6   4.2    AST 0 - 40 IU/L _0 ALT 0 - 44 IU/L _1 Alk Phosphatase 39 - 117 IU/L 40   34   32    Total Bilirubin 0.0 - 1.2 mg/dL 0.5   0.4   0.5         Latest Ref Rng & Units 02/15/2019    8:30 AM 01/12/2018    8:15 AM 02/25/2016    9:51 AM  CBC  WBC 3.4 - 10.8 x10E3/uL 6.5   7.1   6.6    Hemoglobin 13.0 - 17.7 g/dL 13.4   13.7   14.1    Hematocrit 37.5 - 51.0 % 41.5   41.6   42.9    Platelets 150 - 450 x10E3/uL 235   231   244     Lab Results  Component Value Date   MCV 94 02/15/2019   MCV 90 01/12/2018   MCV 90.1 02/25/2016   Lab Results  Component Value Date   TSH 4.380 02/15/2019   Lab Results  Component Value Date   HGBA1C 6.7 (H) 01/12/2018   No results found for: PROBNP  Lipid Panel      Component Value Date/Time   CHOL 142 02/15/2019 0830   CHOL 152 10/25/2013 0804   TRIG 91 02/15/2019 0830   TRIG 96 10/25/2013 0804   HDL 64 02/15/2019 0830   HDL 55 10/25/2013 0804   CHOLHDL 2.2 02/15/2019 0830   CHOLHDL 2.8 02/25/2016 0803   VLDL 20 02/25/2016 0803   LDLCALC 61 02/15/2019 0830   LDLCALC 78 10/25/2013 0804    NMR Profile 10/25/2013   Ref Range 21moago  177mogo       LDL Particle Number <1000 nmol/L 799     Comments: Reference Range   Low < 1000   Moderate 1000 - 1299   Borderline-High 1300 - 1599   High 1600 - 2000   Very High > 2000      LDL (calc) <100 mg/dL 78 72R, CM    Comments: LDL-C is inaccurate if patient is nonfasting. Reference Range   Optimal < 100   Near/Above Optimal 100 - 129   Borderline High 130 - 159   High 160 - 189   Very High >= 190      HDL-C >=40 mg/dL 55      Triglycerides <150 mg/dL 96 398 (H)     Cholesterol, Total <200 mg/dL 152      HDL Particle Number >=30.5 umol/L 37.9      Large HDL-P >=4.8 umol/L 7.6      Large VLDL-P <=2.7 nmol/L 2.5      Small LDL Particle Number <=527 nmol/L 275      LDL Size >20.5 nm 20.9      HDL Size >=9.2 nm 9.0 (L)      VLDL Size <=46.6 nm 41.4      LP-IR Score <=45  37       RADIOLOGY: No results found.  ECHO: 07/16/2021  1. Left ventricular ejection fraction, by estimation, is 55 to  60%. The  left ventricle has normal function. The left ventricle has no regional  wall motion abnormalities. The left ventricular internal cavity size was  mildly dilated. Left ventricular  diastolic parameters were normal.   2. Right ventricular systolic function is normal. The right ventricular  size is normal. Tricuspid regurgitation signal is inadequate for assessing  PA pressure.   3. The mitral valve is grossly normal. Mild mitral valve regurgitation.  No evidence of mitral stenosis.   4. The aortic valve is grossly normal. Aortic valve regurgitation is  trivial. No aortic stenosis is present.    5. The inferior vena cava is normal in size with greater than 50%  respiratory variability, suggesting right atrial pressure of 3 mmHg.   Comparison(s): Report only 11/21/14 EF >55%.    IMPRESSION:  No diagnosis found.   ASSESSMENT AND PLAN: Mr. Rien Marland is a young appearing 81 year-old gentleman who had  normal coronary arteries and normal LV function at cardiac catheterization in1999 after a nuclear perfusion study raised the possibility of subtle ischemia. In 2015, he had developed overt diabetes, and also had episodes of paroxysmal atrial fibrillation.  With significant lifestyle adjustment he initially lost approximately 25 pounds, his blood pressure  normalized and his hemoglobin A1c significantly improved.  He subsequently has lost almost 40 pounds and his weight today was 210 when seen in May 2022.  However, despite being very careful with his diet, it appears that he has developed progressive diabetes mellitus with most recent hemoglobin A1c increasing to 9.1.  Since I last saw him, he initiated Ozempic for diabetic management in August 2022.  Subsequently, he has significantly lost additional weight with his weight today down at 184 and BMI at 24.4.  His blood pressure today is excellent.  Repeat by me was 122/60.  He continues to be on metoprolol succinate 50 mg daily.  He is unaware of any recurrent atrial fibrillation and continues to be on Eliquis.  He continues to be on rosuvastatin 20 mg and fenofibrate 145 mg for mixed hyperlipidemia.  He has slightly reduced his dose of Ozempic and now takes three-quarter of an injection.  His ECG remained stable.  He is traveling extensively with his wife.  I reviewed his most recent echo Doppler study from Jul 16, 2021 which remained stable with EF at 55 to 60% and mild MR.  There was mildly increased LV internal cavity size.  There is no LVH and abnormal diastolic function.  As long as he is stable I will see him in 1 year for reevaluation or sooner  as needed.   Troy Sine, MD, [facility] 08/07/2021 11:56 AM

## 2021-08-07 NOTE — Patient Instructions (Signed)
Medication Instructions:  ?The current medical regimen is effective;  continue present plan and medications as directed. Please refer to the Current Medication list given to you today.  ? ?*If you need a refill on your cardiac medications before your next appointment, please call your pharmacy* ? ?Lab Work:   Testing/Procedures:  ?NONE    NONE ?If you have labs (blood work) drawn today and your tests are completely normal, you will receive your results only by: ?MyChart Message (if you have MyChart) OR  A paper copy in the mail ?If you have any lab test that is abnormal or we need to change your treatment, we will call you to review the results. ? ?Follow-Up: ?Your next appointment:  12 month(s) In Person with Thomas Kelly, MD  ? ?Please call our office 2 months in advance to schedule this appointment  ? ?At [facility], you and your health needs are our priority.  As part of our continuing mission to provide you with exceptional heart care, we have created designated Provider Care Teams.  These Care Teams include your primary Cardiologist (physician) and Advanced Practice Providers (APPs -  Physician Assistants and Nurse Practitioners) who all work together to provide you with the care you need, when you need it. ? ?We recommend signing up for the patient portal called "MyChart".  Sign up information is provided on this After Visit Summary.  MyChart is used to connect with patients for Virtual Visits (Telemedicine).  Patients are able to view lab/test results, encounter notes, upcoming appointments, etc.  Non-urgent messages can be sent to your provider as well.   ?To learn more about what you can do with MyChart, go to [email or web address].   ? ? ? ?Important Information About Sugar ? ? ? ? ? ? ? ?  ? ?

## 2021-08-14 ENCOUNTER — Encounter: Payer: Self-pay | Admitting: Cardiovascular Disease

## 2021-08-19 ENCOUNTER — Ambulatory Visit: Payer: BLUE CROSS/BLUE SHIELD | Admitting: Cardiovascular Disease

## 2021-08-19 DIAGNOSIS — D1801 Hemangioma of skin and subcutaneous tissue: Secondary | ICD-10-CM | POA: Diagnosis not present

## 2021-08-19 DIAGNOSIS — D225 Melanocytic nevi of trunk: Secondary | ICD-10-CM | POA: Diagnosis not present

## 2021-08-19 DIAGNOSIS — L570 Actinic keratosis: Secondary | ICD-10-CM | POA: Diagnosis not present

## 2021-08-19 DIAGNOSIS — Z85828 Personal history of other malignant neoplasm of skin: Secondary | ICD-10-CM | POA: Diagnosis not present

## 2021-09-13 ENCOUNTER — Other Ambulatory Visit: Payer: Self-pay | Admitting: Cardiovascular Disease

## 2021-09-22 ENCOUNTER — Other Ambulatory Visit: Payer: Self-pay | Admitting: Cardiovascular Disease

## 2021-10-11 ENCOUNTER — Other Ambulatory Visit: Payer: Self-pay | Admitting: Cardiovascular Disease

## 2021-10-14 DIAGNOSIS — N401 Enlarged prostate with lower urinary tract symptoms: Secondary | ICD-10-CM | POA: Diagnosis not present

## 2021-10-14 DIAGNOSIS — R350 Frequency of micturition: Secondary | ICD-10-CM | POA: Diagnosis not present

## 2021-10-14 DIAGNOSIS — R972 Elevated prostate specific antigen [PSA]: Secondary | ICD-10-CM | POA: Diagnosis not present

## 2021-10-14 DIAGNOSIS — R351 Nocturia: Secondary | ICD-10-CM | POA: Diagnosis not present

## 2021-10-22 DIAGNOSIS — H9201 Otalgia, right ear: Secondary | ICD-10-CM | POA: Diagnosis not present

## 2021-10-24 DIAGNOSIS — H04123 Dry eye syndrome of bilateral lacrimal glands: Secondary | ICD-10-CM | POA: Diagnosis not present

## 2021-10-31 DIAGNOSIS — H04123 Dry eye syndrome of bilateral lacrimal glands: Secondary | ICD-10-CM | POA: Diagnosis not present

## 2021-11-12 DIAGNOSIS — Z86010 Personal history of colonic polyps: Secondary | ICD-10-CM | POA: Diagnosis not present

## 2021-11-13 ENCOUNTER — Telehealth: Payer: Self-pay

## 2021-11-13 NOTE — Telephone Encounter (Signed)
   Pre-operative Risk Assessment    Patient Name: Victor Sanchez  DOB: [birth year] MRN: [id number]      Request for Surgical Clearance    Procedure:   Colonoscopy  Date of Surgery:  Clearance 12/12/21                                 Surgeon:  Dr. Dulce Sellar Surgeon's Group or Practice Name:  [facility] Phone number:  [phone] Fax number:  [phone]   Type of Clearance Requested:   - Pharmacy:  Hold Apixaban (Eliquis)     Type of Anesthesia:   Propofol   Additional requests/questions:   Please advise how to HOLD Eliquis preoperatively.  Signed, Brunetta Genera   11/13/2021, 8:39 AM

## 2021-11-13 NOTE — Telephone Encounter (Signed)
Will route to PharmD for rec's re: holding anticoagulation. Tereso Newcomer, PA-C    11/13/2021 4:34 PM

## 2021-11-19 ENCOUNTER — Telehealth: Payer: Self-pay | Admitting: *Deleted

## 2021-11-19 NOTE — Telephone Encounter (Signed)
Lab results received from PCP. The labs are dated 07/12/21. I will notate the BMET and CBC values and forward to pre op and pharm-d.   K+ 4.5 Na 139 Creatinine 0.7 BUN 15 eGFR non-african american 108.5 Calcium 8.9  WBC 7.43 RBC 4.2 HGB 13.2 HCT 38.1 PLT 236

## 2021-11-19 NOTE — Telephone Encounter (Signed)
Preop callback team, please notify patient that he will need to come in for lab appointment for BMET and CBC so that recommendations regarding anticoagulation can be given. Additionally, he will need a virtual visit after recs from pharmacy are provided.   Primary Cardiologist:Thomas Tresa Endo, MD  Preoperative team, please contact this patient and set up a phone call appointment for further preoperative risk assessment. Please obtain consent and complete medication review. Thank you for your help.   I confirm that guidance regarding antiplatelet and oral anticoagulation therapy has been completed and, if necessary, noted below.   Levi Aland, NP-C    11/19/2021, 1:30 PM [address] Office [phone] Fax [phone]

## 2021-11-19 NOTE — Telephone Encounter (Signed)
Patient with diagnosis of atrial fibrillation on Eliquis for anticoagulation.    Procedure: colonoscopy Date of procedure: 12/12/21   CHA2DS2-VASc Score = 3   This indicates a 3.2% annual risk of stroke. The patient's score is based upon: CHF History: 0 HTN History: 0 Diabetes History: 1 Stroke History: 0 Vascular Disease History: 0 Age Score: 2 Gender Score: 0    Patient has not had BMET or CBC in greater than 1 year.  Please have labs drawn for final clearance.     **This guidance is not considered finalized until pre-operative APP has relayed final recommendations.**

## 2021-11-19 NOTE — Telephone Encounter (Signed)
I s/w the pt about needing lab work to be done. Pt states he is out of town , though still in [place]. States that he had recent lab work done with PCP. I stated that I will try and obtain the lab work from PCP office. Will d/w then with Pre op provider after review of when we will need to set up tele pre op appt. Pt will not be back in town until 12/10/21 and procedure is 12/12/21. I did confirm though the pt will still be in [place] so that we should be able to still proceed with tele visit. I assured the pt that I will call back once I have know when we need to set up tele. Pt said thank you.    Left message for Dr. Clelia Croft office if they could please fax over recent lab work that was done. In particular BMET, CBC. Once I have labs from PCP I will get scanned into the chart and have pre op and pharm-d review.

## 2021-11-19 NOTE — Telephone Encounter (Signed)
The pt is agreeable to plan of care for tele pre op appt 12/06/21 @ 10 am. Med rec not done as pt is out of town . Consent is done.

## 2021-11-19 NOTE — Telephone Encounter (Signed)
The pt is agreeable to plan of care for tele pre op appt 12/06/21 @ 10 am. Med rec not done as pt is out of town . Consent is done.     Patient Consent for Virtual Visit        Victor Sanchez has provided verbal consent on 11/19/2021 for a virtual visit (video or telephone).   CONSENT FOR VIRTUAL VISIT FOR:  Victor Sanchez  By participating in this virtual visit I agree to the following:  I hereby voluntarily request, consent and authorize [facility] and its employed or contracted physicians, physician assistants, nurse practitioners or other licensed health care professionals (the Practitioner), to provide me with telemedicine health care services (the "Services") as deemed necessary by the treating Practitioner. I acknowledge and consent to receive the Services by the Practitioner via telemedicine. I understand that the telemedicine visit will involve communicating with the Practitioner through live audiovisual communication technology and the disclosure of certain medical information by electronic transmission. I acknowledge that I have been given the opportunity to request an in-person assessment or other available alternative prior to the telemedicine visit and am voluntarily participating in the telemedicine visit.  I understand that I have the right to withhold or withdraw my consent to the use of telemedicine in the course of my care at any time, without affecting my right to future care or treatment, and that the Practitioner or I may terminate the telemedicine visit at any time. I understand that I have the right to inspect all information obtained and/or recorded in the course of the telemedicine visit and may receive copies of available information for a reasonable fee.  I understand that some of the potential risks of receiving the Services via telemedicine include:  Delay or interruption in medical evaluation due to technological equipment failure or disruption; Information  transmitted may not be sufficient (e.g. poor resolution of images) to allow for appropriate medical decision making by the Practitioner; and/or  In rare instances, security protocols could fail, causing a breach of personal health information.  Furthermore, I acknowledge that it is my responsibility to provide information about my medical history, conditions and care that is complete and accurate to the best of my ability. I acknowledge that Practitioner's advice, recommendations, and/or decision may be based on factors not within their control, such as incomplete or inaccurate data provided by me or distortions of diagnostic images or specimens that may result from electronic transmissions. I understand that the practice of medicine is not an exact science and that Practitioner makes no warranties or guarantees regarding treatment outcomes. I acknowledge that a copy of this consent can be made available to me via my patient portal [facility] MyChart), or I can request a printed copy by calling the office of [facility].    I understand that my insurance will be billed for this visit.   I have read or had this consent read to me. I understand the contents of this consent, which adequately explains the benefits and risks of the Services being provided via telemedicine.  I have been provided ample opportunity to ask questions regarding this consent and the Services and have had my questions answered to my satisfaction. I give my informed consent for the services to be provided through the use of telemedicine in my medical care

## 2021-11-19 NOTE — Telephone Encounter (Signed)
Patient with diagnosis of atrial fibrillation on Eliquis for anticoagulation.     Procedure: colonoscopy Date of procedure: 12/12/21     CHA2DS2-VASc Score = 3   This indicates a 3.2% annual risk of stroke. The patient's score is based upon: CHF History: 0 HTN History: 0 Diabetes History: 1 Stroke History: 0 Vascular Disease History: 0 Age Score: 2 Gender Score: 0  CrCl 100 Platelet count 236  Per office protocol, patient can hold Eliquis for 2 days prior to procedure.   Patient will not need bridging with Lovenox (enoxaparin) around procedure.  **This guidance is not considered finalized until pre-operative APP has relayed final recommendations.**

## 2021-12-06 ENCOUNTER — Ambulatory Visit: Payer: BC Managed Care – PPO | Attending: Cardiology | Admitting: Physician Assistant

## 2021-12-06 DIAGNOSIS — Z01810 Encounter for preprocedural cardiovascular examination: Secondary | ICD-10-CM | POA: Diagnosis not present

## 2021-12-06 NOTE — Progress Notes (Signed)
Virtual Visit via Telephone Note   Because of Victor Sanchez's co-morbid illnesses, he is at least at moderate risk for complications without adequate follow up.  This format is felt to be most appropriate for this patient at this time.  The patient did not have access to video technology/had technical difficulties with video requiring transitioning to audio format only (telephone).  All issues noted in this document were discussed and addressed.  No physical exam could be performed with this format.  Please refer to the patient's chart for his consent to telehealth for [facility].  Evaluation Performed:  Preoperative cardiovascular risk assessment _____________   Date:  12/06/2021   Patient ID:  Victor Sanchez, DOB 1940-03-21, MRN [id number] Patient Location:  Home Provider location:   Office  Primary Care Provider:  Ginger Organ., MD Primary Cardiologist:  Shelva Majestic, MD  Chief Complaint / Patient Profile   80 y.o. y/o male with a h/o diabetes mellitus without complication, enlarged prostate, hypertension, hyperlipidemia, OSA who is pending colonoscopy and presents today for telephonic preoperative cardiovascular risk assessment.  Past Medical History    Past Medical History:  Diagnosis Date   Diabetes mellitus without complication ([place])    Enlarged prostate    Headache(784.0)    occ.   History of asthma    as a child   Hyperlipidemia    Hypertension    Sleep apnea    2013   Past Surgical History:  Procedure Laterality Date   APPENDECTOMY  1968   CARDIAC CATHETERIZATION  09/06/1997   normal LV function, normal coronaries (Dr. Shelva Majestic)   KNEE ARTHROSCOPY  1990'   with baker's cyst excision unsure side   NASAL SEPTOPLASTY W/ TURBINOPLASTY Bilateral 05/25/2013   Procedure: NASAL SEPTOPLASTY WITH TURBINATE REDUCTION;  Surgeon: Jodi Marble, MD;  Location: [facility];  Service: ENT;  Laterality: Bilateral;   NASOPHARYNGOSCOPY  1972   cyst removal   NM  MYOCAR PERF WALL MOTION  03/2006   bruce myoview - mild-mod perfusion defect in basal inferoseptal walls with mild reversibility at rest; mild to mod defect in mid inferior wall with mild reversibility at rest; EF 63%; intermediate risk scan   TONSILLECTOMY     as a child   TRANSTHORACIC ECHOCARDIOGRAM  03/2006   EF=>55%, mild conc LVH; borderline RV/RA enlargement; borderline MVP, prolapse of anterior leaflets, mild MR; mild TR; AV mildly sclerotic   UVULECTOMY N/A 05/25/2013   Procedure: Myrtis Ser ASSISTED;  Surgeon: Jodi Marble, MD;  Location: [facility] OR;  Service: ENT;  Laterality: N/A;   VENTRAL HERNIA REPAIR     x 2    Allergies  No Known Allergies  History of Present Illness    Victor Sanchez is a 81 y.o. male who presents via audio/video conferencing for a telehealth visit today.  Pt was last seen in cardiology clinic on 08/07/2021 by Dr. Claiborne Billings.  At that time Victor Sanchez was doing well .  The patient is now pending procedure as outlined above. Since his last visit, he has been doing really well.  He denies chest pain, shortness of breath, and palpitations.  He states [place] the [place] hiking.  He has no issues with walking, stairs, or indoor and outdoor tasks.  Because of this, he is scored a 5.62 METS on the DASI.  This exceeds the 4 METS minimum requirement.  We discussed holding his Eliquis 2 days prior to his procedure.  He can restart it when medically safe to do so.   Home Medications    Prior to Admission medications   Medication Sig Start Date End Date Taking? Authorizing Provider  apixaban (ELIQUIS) 5 MG TABS tablet TAKE 1 TABLET(5 MG) BY MOUTH TWICE DAILY 02/18/19   Lennette Bihari, MD  fenofibrate (TRICOR) 145 MG tablet TAKE 1 TABLET(145 MG) BY MOUTH DAILY 09/23/21   Lennette Bihari, MD  finasteride (PROSCAR) 5 MG tablet Take 5 mg by mouth daily.    [provider]  metoprolol succinate (TOPROL-XL) 50 MG 24 hr tablet TAKE 1 TABLET BY  MOUTH EVERY DAY WITH OR IMMEDIATELY FOLLOWING A MEAL 10/11/21   Lennette Bihari, MD  Multiple Vitamins-Minerals (MULTIVITAMIN WITH MINERALS) tablet Take 1 tablet by mouth daily.    [provider]  rosuvastatin (CRESTOR) 20 MG tablet TAKE 1 TABLET(20 MG) BY MOUTH DAILY 09/13/21   Lennette Bihari, MD  Semaglutide, 1 MG/DOSE, (OZEMPIC, 1 MG/DOSE,) 4 MG/3ML SOPN Inject 1 mg into the skin once a week. 03/25/21     tamsulosin (FLOMAX) 0.4 MG CAPS capsule Take 0.4 mg by mouth daily.    [provider]    Physical Exam    Vital Signs:  Victor Sanchez does not have vital signs available for review today.  Given telephonic nature of communication, physical exam is limited. AAOx3. NAD. Normal affect.  Speech and respirations are unlabored.  Accessory Clinical Findings    None  Assessment & Plan    1.  Preoperative Cardiovascular Risk Assessment:  Victor Sanchez's perioperative risk of a major cardiac event is 0.4% according to the Revised Cardiac Risk Index (RCRI).  Therefore, he is at low risk for perioperative complications.   His functional capacity is good at 5.62 METs according to the [place] Activity Status Index (DASI). Recommendations: According to ACC/AHA guidelines, no further cardiovascular testing needed.  The patient may proceed to surgery at acceptable risk.   Antiplatelet and/or Anticoagulation Recommendations:  Eliquis (Apixaban) can be held for 2 days prior to surgery.  Please resume post op when felt to be safe.     A copy of this note will be routed to requesting surgeon.  Time:   Today, I have spent 10 minutes with the patient with telehealth technology discussing medical history, symptoms, and management plan.     Sharlene Dory, PA-C  12/06/2021, 10:04 AM

## 2021-12-12 DIAGNOSIS — K648 Other hemorrhoids: Secondary | ICD-10-CM | POA: Diagnosis not present

## 2021-12-12 DIAGNOSIS — K5730 Diverticulosis of large intestine without perforation or abscess without bleeding: Secondary | ICD-10-CM | POA: Diagnosis not present

## 2021-12-12 DIAGNOSIS — Z86010 Personal history of colonic polyps: Secondary | ICD-10-CM | POA: Diagnosis not present

## 2021-12-12 DIAGNOSIS — D124 Benign neoplasm of descending colon: Secondary | ICD-10-CM | POA: Diagnosis not present

## 2022-02-20 DIAGNOSIS — H25813 Combined forms of age-related cataract, bilateral: Secondary | ICD-10-CM | POA: Diagnosis not present

## 2022-02-20 DIAGNOSIS — H524 Presbyopia: Secondary | ICD-10-CM | POA: Diagnosis not present

## 2022-02-20 DIAGNOSIS — H52223 Regular astigmatism, bilateral: Secondary | ICD-10-CM | POA: Diagnosis not present

## 2022-02-20 DIAGNOSIS — H04123 Dry eye syndrome of bilateral lacrimal glands: Secondary | ICD-10-CM | POA: Diagnosis not present

## 2022-02-20 DIAGNOSIS — E119 Type 2 diabetes mellitus without complications: Secondary | ICD-10-CM | POA: Diagnosis not present

## 2022-03-14 DIAGNOSIS — I10 Essential (primary) hypertension: Secondary | ICD-10-CM | POA: Diagnosis not present

## 2022-03-14 DIAGNOSIS — Z87898 Personal history of other specified conditions: Secondary | ICD-10-CM | POA: Diagnosis not present

## 2022-03-14 DIAGNOSIS — N401 Enlarged prostate with lower urinary tract symptoms: Secondary | ICD-10-CM | POA: Diagnosis not present

## 2022-03-14 DIAGNOSIS — E785 Hyperlipidemia, unspecified: Secondary | ICD-10-CM | POA: Diagnosis not present

## 2022-03-14 DIAGNOSIS — Z7901 Long term (current) use of anticoagulants: Secondary | ICD-10-CM | POA: Diagnosis not present

## 2022-03-14 DIAGNOSIS — E781 Pure hyperglyceridemia: Secondary | ICD-10-CM | POA: Diagnosis not present

## 2022-03-14 DIAGNOSIS — Z86010 Personal history of colonic polyps: Secondary | ICD-10-CM | POA: Diagnosis not present

## 2022-03-14 DIAGNOSIS — E1149 Type 2 diabetes mellitus with other diabetic neurological complication: Secondary | ICD-10-CM | POA: Diagnosis not present

## 2022-03-14 DIAGNOSIS — I480 Paroxysmal atrial fibrillation: Secondary | ICD-10-CM | POA: Diagnosis not present

## 2022-03-14 DIAGNOSIS — G4700 Insomnia, unspecified: Secondary | ICD-10-CM | POA: Diagnosis not present

## 2022-03-14 DIAGNOSIS — D6869 Other thrombophilia: Secondary | ICD-10-CM | POA: Diagnosis not present

## 2022-03-19 DIAGNOSIS — L570 Actinic keratosis: Secondary | ICD-10-CM | POA: Diagnosis not present

## 2022-03-20 DIAGNOSIS — M170 Bilateral primary osteoarthritis of knee: Secondary | ICD-10-CM | POA: Diagnosis not present

## 2022-03-27 DIAGNOSIS — M170 Bilateral primary osteoarthritis of knee: Secondary | ICD-10-CM | POA: Diagnosis not present

## 2022-04-03 DIAGNOSIS — M170 Bilateral primary osteoarthritis of knee: Secondary | ICD-10-CM | POA: Diagnosis not present

## 2022-04-15 DIAGNOSIS — L820 Inflamed seborrheic keratosis: Secondary | ICD-10-CM | POA: Diagnosis not present

## 2022-04-17 HISTORY — PX: CHOLECYSTECTOMY: SHX55

## 2022-04-24 DIAGNOSIS — E785 Hyperlipidemia, unspecified: Secondary | ICD-10-CM | POA: Diagnosis not present

## 2022-04-24 DIAGNOSIS — K821 Hydrops of gallbladder: Secondary | ICD-10-CM | POA: Diagnosis not present

## 2022-04-24 DIAGNOSIS — E1165 Type 2 diabetes mellitus with hyperglycemia: Secondary | ICD-10-CM | POA: Diagnosis not present

## 2022-04-24 DIAGNOSIS — I10 Essential (primary) hypertension: Secondary | ICD-10-CM | POA: Diagnosis not present

## 2022-04-24 DIAGNOSIS — Z7901 Long term (current) use of anticoagulants: Secondary | ICD-10-CM | POA: Diagnosis not present

## 2022-04-24 DIAGNOSIS — I2510 Atherosclerotic heart disease of native coronary artery without angina pectoris: Secondary | ICD-10-CM | POA: Diagnosis not present

## 2022-04-24 DIAGNOSIS — R0789 Other chest pain: Secondary | ICD-10-CM | POA: Diagnosis not present

## 2022-04-24 DIAGNOSIS — K810 Acute cholecystitis: Secondary | ICD-10-CM | POA: Diagnosis not present

## 2022-04-24 DIAGNOSIS — R748 Abnormal levels of other serum enzymes: Secondary | ICD-10-CM | POA: Diagnosis not present

## 2022-04-24 DIAGNOSIS — R1011 Right upper quadrant pain: Secondary | ICD-10-CM | POA: Diagnosis not present

## 2022-04-24 DIAGNOSIS — R0602 Shortness of breath: Secondary | ICD-10-CM | POA: Diagnosis not present

## 2022-04-24 DIAGNOSIS — K8012 Calculus of gallbladder with acute and chronic cholecystitis without obstruction: Secondary | ICD-10-CM | POA: Diagnosis not present

## 2022-04-24 DIAGNOSIS — K828 Other specified diseases of gallbladder: Secondary | ICD-10-CM | POA: Diagnosis not present

## 2022-04-24 DIAGNOSIS — Z794 Long term (current) use of insulin: Secondary | ICD-10-CM | POA: Diagnosis not present

## 2022-04-24 DIAGNOSIS — I119 Hypertensive heart disease without heart failure: Secondary | ICD-10-CM | POA: Diagnosis not present

## 2022-04-24 DIAGNOSIS — R739 Hyperglycemia, unspecified: Secondary | ICD-10-CM | POA: Diagnosis not present

## 2022-04-24 DIAGNOSIS — Z9049 Acquired absence of other specified parts of digestive tract: Secondary | ICD-10-CM | POA: Diagnosis not present

## 2022-04-24 DIAGNOSIS — K8000 Calculus of gallbladder with acute cholecystitis without obstruction: Secondary | ICD-10-CM | POA: Diagnosis not present

## 2022-04-24 DIAGNOSIS — Z87891 Personal history of nicotine dependence: Secondary | ICD-10-CM | POA: Diagnosis not present

## 2022-04-24 DIAGNOSIS — R079 Chest pain, unspecified: Secondary | ICD-10-CM | POA: Diagnosis not present

## 2022-04-24 DIAGNOSIS — K760 Fatty (change of) liver, not elsewhere classified: Secondary | ICD-10-CM | POA: Diagnosis not present

## 2022-04-24 DIAGNOSIS — R7989 Other specified abnormal findings of blood chemistry: Secondary | ICD-10-CM | POA: Diagnosis not present

## 2022-04-24 DIAGNOSIS — N400 Enlarged prostate without lower urinary tract symptoms: Secondary | ICD-10-CM | POA: Diagnosis not present

## 2022-04-24 DIAGNOSIS — I4891 Unspecified atrial fibrillation: Secondary | ICD-10-CM | POA: Diagnosis not present

## 2022-04-24 DIAGNOSIS — I480 Paroxysmal atrial fibrillation: Secondary | ICD-10-CM | POA: Diagnosis not present

## 2022-04-25 DIAGNOSIS — K8000 Calculus of gallbladder with acute cholecystitis without obstruction: Secondary | ICD-10-CM | POA: Diagnosis not present

## 2022-04-25 DIAGNOSIS — R748 Abnormal levels of other serum enzymes: Secondary | ICD-10-CM | POA: Diagnosis not present

## 2022-04-25 DIAGNOSIS — Z7901 Long term (current) use of anticoagulants: Secondary | ICD-10-CM | POA: Diagnosis not present

## 2022-04-25 DIAGNOSIS — I480 Paroxysmal atrial fibrillation: Secondary | ICD-10-CM | POA: Diagnosis not present

## 2022-04-26 DIAGNOSIS — I480 Paroxysmal atrial fibrillation: Secondary | ICD-10-CM | POA: Diagnosis not present

## 2022-04-26 DIAGNOSIS — K8000 Calculus of gallbladder with acute cholecystitis without obstruction: Secondary | ICD-10-CM | POA: Diagnosis not present

## 2022-04-26 DIAGNOSIS — R748 Abnormal levels of other serum enzymes: Secondary | ICD-10-CM | POA: Diagnosis not present

## 2022-04-26 DIAGNOSIS — Z7901 Long term (current) use of anticoagulants: Secondary | ICD-10-CM | POA: Diagnosis not present

## 2022-04-27 DIAGNOSIS — I480 Paroxysmal atrial fibrillation: Secondary | ICD-10-CM | POA: Diagnosis not present

## 2022-04-27 DIAGNOSIS — K8000 Calculus of gallbladder with acute cholecystitis without obstruction: Secondary | ICD-10-CM | POA: Diagnosis not present

## 2022-04-27 DIAGNOSIS — E1165 Type 2 diabetes mellitus with hyperglycemia: Secondary | ICD-10-CM | POA: Diagnosis not present

## 2022-04-27 DIAGNOSIS — I2510 Atherosclerotic heart disease of native coronary artery without angina pectoris: Secondary | ICD-10-CM | POA: Diagnosis not present

## 2022-04-27 DIAGNOSIS — N400 Enlarged prostate without lower urinary tract symptoms: Secondary | ICD-10-CM | POA: Diagnosis not present

## 2022-04-27 DIAGNOSIS — I10 Essential (primary) hypertension: Secondary | ICD-10-CM | POA: Diagnosis not present

## 2022-04-27 DIAGNOSIS — K8012 Calculus of gallbladder with acute and chronic cholecystitis without obstruction: Secondary | ICD-10-CM | POA: Diagnosis not present

## 2022-04-27 DIAGNOSIS — R0602 Shortness of breath: Secondary | ICD-10-CM | POA: Diagnosis not present

## 2022-04-27 DIAGNOSIS — K821 Hydrops of gallbladder: Secondary | ICD-10-CM | POA: Diagnosis not present

## 2022-04-27 DIAGNOSIS — Z7901 Long term (current) use of anticoagulants: Secondary | ICD-10-CM | POA: Diagnosis not present

## 2022-04-27 DIAGNOSIS — I119 Hypertensive heart disease without heart failure: Secondary | ICD-10-CM | POA: Diagnosis not present

## 2022-04-27 DIAGNOSIS — E785 Hyperlipidemia, unspecified: Secondary | ICD-10-CM | POA: Diagnosis not present

## 2022-04-27 DIAGNOSIS — K812 Acute cholecystitis with chronic cholecystitis: Secondary | ICD-10-CM | POA: Diagnosis not present

## 2022-04-27 DIAGNOSIS — R748 Abnormal levels of other serum enzymes: Secondary | ICD-10-CM | POA: Diagnosis not present

## 2022-05-08 DIAGNOSIS — K8000 Calculus of gallbladder with acute cholecystitis without obstruction: Secondary | ICD-10-CM | POA: Diagnosis not present

## 2022-05-08 DIAGNOSIS — Z7901 Long term (current) use of anticoagulants: Secondary | ICD-10-CM | POA: Diagnosis not present

## 2022-06-27 ENCOUNTER — Other Ambulatory Visit (HOSPITAL_COMMUNITY): Payer: Self-pay

## 2022-06-30 ENCOUNTER — Other Ambulatory Visit (HOSPITAL_COMMUNITY): Payer: Self-pay

## 2022-06-30 MED ORDER — ZOLPIDEM TARTRATE 10 MG PO TABS
5.0000 mg | ORAL_TABLET | Freq: Every day | ORAL | 0 refills | Status: DC
  Filled 2022-06-30: qty 30, 30d supply, fill #0

## 2022-06-30 MED ORDER — AZITHROMYCIN 250 MG PO TABS
ORAL_TABLET | ORAL | 0 refills | Status: AC
  Filled 2022-06-30: qty 6, 5d supply, fill #0

## 2022-07-24 DIAGNOSIS — E785 Hyperlipidemia, unspecified: Secondary | ICD-10-CM | POA: Diagnosis not present

## 2022-07-24 DIAGNOSIS — E1149 Type 2 diabetes mellitus with other diabetic neurological complication: Secondary | ICD-10-CM | POA: Diagnosis not present

## 2022-07-24 DIAGNOSIS — R946 Abnormal results of thyroid function studies: Secondary | ICD-10-CM | POA: Diagnosis not present

## 2022-07-24 DIAGNOSIS — Z125 Encounter for screening for malignant neoplasm of prostate: Secondary | ICD-10-CM | POA: Diagnosis not present

## 2022-07-24 DIAGNOSIS — R7989 Other specified abnormal findings of blood chemistry: Secondary | ICD-10-CM | POA: Diagnosis not present

## 2022-07-24 DIAGNOSIS — R350 Frequency of micturition: Secondary | ICD-10-CM | POA: Diagnosis not present

## 2022-07-24 DIAGNOSIS — N401 Enlarged prostate with lower urinary tract symptoms: Secondary | ICD-10-CM | POA: Diagnosis not present

## 2022-07-24 DIAGNOSIS — I10 Essential (primary) hypertension: Secondary | ICD-10-CM | POA: Diagnosis not present

## 2022-07-31 DIAGNOSIS — N401 Enlarged prostate with lower urinary tract symptoms: Secondary | ICD-10-CM | POA: Diagnosis not present

## 2022-07-31 DIAGNOSIS — R197 Diarrhea, unspecified: Secondary | ICD-10-CM | POA: Diagnosis not present

## 2022-07-31 DIAGNOSIS — D6869 Other thrombophilia: Secondary | ICD-10-CM | POA: Diagnosis not present

## 2022-07-31 DIAGNOSIS — E781 Pure hyperglyceridemia: Secondary | ICD-10-CM | POA: Diagnosis not present

## 2022-07-31 DIAGNOSIS — I10 Essential (primary) hypertension: Secondary | ICD-10-CM | POA: Diagnosis not present

## 2022-07-31 DIAGNOSIS — K9189 Other postprocedural complications and disorders of digestive system: Secondary | ICD-10-CM | POA: Diagnosis not present

## 2022-07-31 DIAGNOSIS — Z0000 Encounter for general adult medical examination without abnormal findings: Secondary | ICD-10-CM | POA: Diagnosis not present

## 2022-07-31 DIAGNOSIS — I480 Paroxysmal atrial fibrillation: Secondary | ICD-10-CM | POA: Diagnosis not present

## 2022-07-31 DIAGNOSIS — R82998 Other abnormal findings in urine: Secondary | ICD-10-CM | POA: Diagnosis not present

## 2022-07-31 DIAGNOSIS — E1149 Type 2 diabetes mellitus with other diabetic neurological complication: Secondary | ICD-10-CM | POA: Diagnosis not present

## 2022-07-31 DIAGNOSIS — E785 Hyperlipidemia, unspecified: Secondary | ICD-10-CM | POA: Diagnosis not present

## 2022-07-31 DIAGNOSIS — Z1339 Encounter for screening examination for other mental health and behavioral disorders: Secondary | ICD-10-CM | POA: Diagnosis not present

## 2022-07-31 DIAGNOSIS — Z1331 Encounter for screening for depression: Secondary | ICD-10-CM | POA: Diagnosis not present

## 2022-08-21 ENCOUNTER — Other Ambulatory Visit (HOSPITAL_COMMUNITY): Payer: Self-pay

## 2022-08-21 MED ORDER — OZEMPIC (1 MG/DOSE) 4 MG/3ML ~~LOC~~ SOPN
1.0000 mg | PEN_INJECTOR | SUBCUTANEOUS | 3 refills | Status: AC
  Filled 2022-08-21: qty 3, 28d supply, fill #0

## 2022-08-22 ENCOUNTER — Other Ambulatory Visit (HOSPITAL_COMMUNITY): Payer: Self-pay

## 2022-08-27 ENCOUNTER — Ambulatory Visit: Payer: BC Managed Care – PPO | Admitting: General Practice

## 2022-09-22 NOTE — Progress Notes (Unsigned)
Cardiology Clinic Note   Patient Name: Victor Sanchez Date of Encounter: 09/22/2022  Primary Care Provider:  Cleatis Polka., MD Primary Cardiologist:  Nicki Guadalajara, MD  Patient Profile    Victor Sanchez 82 year old male presents to the clinic today for follow-up evaluation of his paroxysmal atrial fibrillation and essential hypertension.  Past Medical History    Past Medical History:  Diagnosis Date   Diabetes mellitus without complication (HCC)    Enlarged prostate    Headache(784.0)    occ.   History of asthma    as a child   Hyperlipidemia    Hypertension    Sleep apnea    2013   Past Surgical History:  Procedure Laterality Date   APPENDECTOMY  1968   CARDIAC CATHETERIZATION  09/06/1997   normal LV function, normal coronaries (Dr. Nicki Guadalajara)   KNEE ARTHROSCOPY  1990'   with baker's cyst excision unsure side   NASAL SEPTOPLASTY W/ TURBINOPLASTY Bilateral 05/25/2013   Procedure: NASAL SEPTOPLASTY WITH TURBINATE REDUCTION;  Surgeon: Flo Shanks, MD;  Location: [facility] OR;  Service: ENT;  Laterality: Bilateral;   NASOPHARYNGOSCOPY  1972   cyst removal   NM MYOCAR PERF WALL MOTION  03/2006   bruce myoview - mild-mod perfusion defect in basal inferoseptal walls with mild reversibility at rest; mild to mod defect in mid inferior wall with mild reversibility at rest; EF 63%; intermediate risk scan   TONSILLECTOMY     as a child   TRANSTHORACIC ECHOCARDIOGRAM  03/2006   EF=>55%, mild conc LVH; borderline RV/RA enlargement; borderline MVP, prolapse of anterior leaflets, mild MR; mild TR; AV mildly sclerotic   UVULECTOMY N/A 05/25/2013   Procedure: [place] ASSISTED;  Surgeon: Flo Shanks, MD;  Location: [facility] OR;  Service: ENT;  Laterality: N/A;   VENTRAL HERNIA REPAIR     x 2    Allergies  No Known Allergies  History of Present Illness    Victor Sanchez is a PMH of cardiac arrhythmia, PAF, OSA, deviated nasal septum, type 2 diabetes, hyperlipidemia, mild obesity,  metabolic syndrome, palpitations, and essential hypertension.  Echocardiogram 5/23 showed an EF of 55-60%, minimal dilation of his LV internal cavity.   He was seen with atypical chest pain in 1999.  His nuclear stress test showed the possibility of inferior scar with possible inferior lateral redistribution.  A cardiac catheterization showed normal LV function and normal coronary arteries.  A nuclear stress test in 2008 showed diaphragmatic attenuation with normal interpretation.  He underwent ENT surgery for a deviated septum and 2015 and was found to have abnormal heart rhythms.  There was concern for atrial fibrillation and he reestablish care with Dr. Tresa Endo after the episode.  An echocardiogram study showed an ejection fraction 55-60%.  A cardiac event monitor showed recurrent episodes of PAF with rate in the low 100sx1.  He also had a 10 beat run of PSVT during that time 4/ 9/15.   He continued to remain stable and was last seen by Dr. Tresa Endo 02/18/2019.  He was noted to have intentional weight loss and had lost over 40 pounds.  He denied chest pain he was unaware of recurrent atrial fibrillation.  He was still having trouble managing his blood sugar although he was eating better.  His lab results 8/20 showed a hemoglobin A1c of 8.1.  His BUN and creatinine were 18 and 0.9.  Lipid panel showed a total cholesterol of 142 HDL 64, and LDL 61.  His  rosuvastatin was continued in addition to his fenofibrate.  He was walking 3 miles per day at a slow pace with his dog.   He presented to the clinic 11/04/19 for follow-up evaluation and preoperative card evaluation.  He stated he continued to be very physically active walking 2 miles per day and doing yard work. He stated that he watched his diet closely and monitored his sugar intake. However he did not monitor his salt intake as closely. He had no complaints of palpitations or atrial fibrillation. He denied bleeding issues. I gave him the salty 6 diet sheet and  had him continue his physical activity and planned follow-up with Dr. Tresa Endo in 6 months.  He was seen in follow-up by Dr. Tresa Endo on 08/07/2021.  During that time he reported weight loss which was purposeful.  He had started on Ozempic for diabetes 8/22.  He noted significant appetite reduction.  He denied chest pain shortness of breath.  He was unaware of recurrent atrial fibrillation.  He reported that he and his wife had been traveling and had gone to [place].  They were planning to travel to [place] and [place] as well.  He presents to the clinic today for follow-up evaluation and states***.   Today he denies chest pain, shortness of breath, lower extremity edema, fatigue, palpitations, melena, hematuria, hemoptysis, diaphoresis, weakness, presyncope, syncope, orthopnea, and PND.  Paroxysmal atrial fibrillation-EKG today shows normal*** sinus rhythm 64 bpm.  Reports compliance with apixaban.  Denies bleeding issues Continue metoprolol, Eliquis Heart healthy low-sodium diet-salty 6 reviewed Maintain physical activity Avoid triggers   Mixed hyperlipidemia-LDL*** Continue rosuvastatin, fenofibrate High-fiber diet Increase physical activity as tolerated  Essential hypertension-BP today 12***2/78.  Well-controlled at home Continue metoprolol Maintain blood pressure log   Type 2 diabetes-glucose***. Continue current medical therapy Follows with PCP   Disposition: Follow-up with Dr. Tresa Endo in 6 months.  Home Medications    Prior to Admission medications   Medication Sig Start Date End Date Taking? Authorizing Provider  apixaban (ELIQUIS) 5 MG TABS tablet TAKE 1 TABLET(5 MG) BY MOUTH TWICE DAILY 02/18/19   Lennette Bihari, MD  fenofibrate (TRICOR) 145 MG tablet TAKE 1 TABLET(145 MG) BY MOUTH DAILY 09/23/21   Lennette Bihari, MD  finasteride (PROSCAR) 5 MG tablet Take 5 mg by mouth daily.    [provider]  metoprolol succinate (TOPROL-XL) 50 MG 24 hr tablet TAKE 1 TABLET BY  MOUTH EVERY DAY WITH OR IMMEDIATELY FOLLOWING A MEAL 10/11/21   Lennette Bihari, MD  Multiple Vitamins-Minerals (MULTIVITAMIN WITH MINERALS) tablet Take 1 tablet by mouth daily.    [provider]  rosuvastatin (CRESTOR) 20 MG tablet TAKE 1 TABLET(20 MG) BY MOUTH DAILY 09/13/21   Lennette Bihari, MD  Semaglutide, 1 MG/DOSE, (OZEMPIC, 1 MG/DOSE,) 4 MG/3ML SOPN Inject 1 mg into the skin once a week. 03/25/21     Semaglutide, 1 MG/DOSE, (OZEMPIC, 1 MG/DOSE,) 4 MG/3ML SOPN Inject 1 mg into the skin once a week. 08/21/22     tamsulosin (FLOMAX) 0.4 MG CAPS capsule Take 0.4 mg by mouth daily.    [provider]  zolpidem (AMBIEN) 10 MG tablet Take 1/2-1 tablet (5-10 mg total) by mouth daily. 06/27/22       Family History    Family History  Problem Relation Age of Onset   Kidney failure Mother    Emphysema Father    Heart disease Other    Breast cancer Sister    Multiple myeloma Sister  He indicated that his mother is deceased. He indicated that his father is deceased. He indicated that two of his four sisters are alive. He indicated that the status of his other is unknown.  Social History    Social History   Socioeconomic History   Marital status: Married    Spouse name: Not on file   Number of children: 3   Years of education: master's   Highest education level: Not on file  Occupational History    Employer: [employer]  Tobacco Use   Smoking status: Former    Packs/day: 1.00    Years: 6.00    Additional pack years: 0.00    Total pack years: 6.00    Types: Cigarettes    Quit date: 03/18/1967    Years since quitting: 55.5   Smokeless tobacco: Never  Substance and Sexual Activity   Alcohol use: Yes    Alcohol/week: 7.0 standard drinks of alcohol    Types: 7 Glasses of wine per week    Comment: glass of wine or cocktail daily   Drug use: No   Sexual activity: Not on file  Other Topics Concern   Not on file  Social History Narrative   Not on file   Social  Determinants of Health   Financial Resource Strain: Not on file  Food Insecurity: Not on file  Transportation Needs: Not on file  Physical Activity: Not on file  Stress: Not on file  Social Connections: Not on file  Intimate Partner Violence: Not on file     Review of Systems    General:  No chills, fever, night sweats or weight changes.  Cardiovascular:  No chest pain, dyspnea on exertion, edema, orthopnea, palpitations, paroxysmal nocturnal dyspnea. Dermatological: No rash, lesions/masses Respiratory: No cough, dyspnea Urologic: No hematuria, dysuria Abdominal:   No nausea, vomiting, diarrhea, bright red blood per rectum, melena, or hematemesis Neurologic:  No visual changes, wkns, changes in mental status. All other systems reviewed and are otherwise negative except as noted above.  Physical Exam    VS:  There were no vitals taken for this visit. , BMI There is no height or weight on file to calculate BMI. GEN: Well nourished, well developed, in no acute distress. HEENT: normal. Neck: Supple, no JVD, carotid bruits, or masses. Cardiac: RRR, no murmurs, rubs, or gallops. No clubbing, cyanosis, edema.  Radials/DP/PT 2+ and equal bilaterally.  Respiratory:  Respirations regular and unlabored, clear to auscultation bilaterally. GI: Soft, nontender, nondistended, BS + x 4. MS: no deformity or atrophy. Skin: warm and dry, no rash. Neuro:  Strength and sensation are intact. Psych: Normal affect.  Accessory Clinical Findings    Recent Labs: No results found for requested labs within last 365 days.   Recent Lipid Panel    Component Value Date/Time   CHOL 142 02/15/2019 0830   CHOL 152 10/25/2013 0804   TRIG 91 02/15/2019 0830   TRIG 96 10/25/2013 0804   HDL 64 02/15/2019 0830   HDL 55 10/25/2013 0804   CHOLHDL 2.2 02/15/2019 0830   CHOLHDL 2.8 02/25/2016 0803   VLDL 20 02/25/2016 0803   LDLCALC 61 02/15/2019 0830   LDLCALC 78 10/25/2013 0804    No BP recorded.   {Refresh Note OR Click here to enter BP  :1}***    ECG personally reviewed by me today- ***    - No acute changes  EKG 11/04/2019 normal sinus rhythm 64 bpm- No acute changes   EKG 02/18/2019 Sinus bradycardia  nonspecific ST abnormality 58 bpm   Assessment & Plan   1.  ***   Thomasene Ripple. Cleaver NP-C     09/22/2022, 10:43 AM [facility] [address] Office [phone] Fax [phone]    I spent***minutes examining this patient, reviewing medications, and using patient centered shared decision making involving her cardiac care.  Prior to her visit I spent greater than 20 minutes reviewing her past medical history,  medications, and prior cardiac tests.

## 2022-09-23 DIAGNOSIS — R350 Frequency of micturition: Secondary | ICD-10-CM | POA: Diagnosis not present

## 2022-09-23 DIAGNOSIS — N401 Enlarged prostate with lower urinary tract symptoms: Secondary | ICD-10-CM | POA: Diagnosis not present

## 2022-09-23 DIAGNOSIS — R3915 Urgency of urination: Secondary | ICD-10-CM | POA: Diagnosis not present

## 2022-09-23 DIAGNOSIS — R351 Nocturia: Secondary | ICD-10-CM | POA: Diagnosis not present

## 2022-09-25 ENCOUNTER — Encounter: Payer: Self-pay | Admitting: General Practice

## 2022-09-25 ENCOUNTER — Ambulatory Visit: Payer: Medicare HMO | Attending: General Practice | Admitting: Cardiology

## 2022-09-25 VITALS — BP 106/64 | HR 70 | Ht 72.0 in | Wt 185.6 lb

## 2022-09-25 DIAGNOSIS — Z7985 Long-term (current) use of injectable non-insulin antidiabetic drugs: Secondary | ICD-10-CM

## 2022-09-25 DIAGNOSIS — I480 Paroxysmal atrial fibrillation: Secondary | ICD-10-CM

## 2022-09-25 DIAGNOSIS — E119 Type 2 diabetes mellitus without complications: Secondary | ICD-10-CM

## 2022-09-25 DIAGNOSIS — E782 Mixed hyperlipidemia: Secondary | ICD-10-CM | POA: Diagnosis not present

## 2022-09-25 DIAGNOSIS — I10 Essential (primary) hypertension: Secondary | ICD-10-CM | POA: Diagnosis not present

## 2022-09-25 NOTE — Patient Instructions (Signed)
Medication Instructions:  The current medical regimen is effective;  continue present plan and medications as directed. Please refer to the Current Medication list given to you today.  *If you need a refill on your cardiac medications before your next appointment, please call your pharmacy*  Lab Work: NONE If you have labs (blood work) drawn today and your tests are completely normal, you will receive your results only by: MyChart Message (if you have MyChart) OR A paper copy in the mail If you have any lab test that is abnormal or we need to change your treatment, we will call you to review the results.  Testing/Procedures: DONE   Follow-Up: At [facility], you and your health needs are our priority.  As part of our continuing mission to provide you with exceptional heart care, we have created designated Provider Care Teams.  These Care Teams include your primary Cardiologist (physician) and Advanced Practice Providers (APPs -  Physician Assistants and Nurse Practitioners) who all work together to provide you with the care you need, when you need it.  Your next appointment:   12 month(s)  Provider:   Nicki Guadalajara, MD     Other Instructions

## 2022-10-02 ENCOUNTER — Other Ambulatory Visit: Payer: Self-pay | Admitting: Cardiovascular Disease

## 2022-11-14 DIAGNOSIS — M170 Bilateral primary osteoarthritis of knee: Secondary | ICD-10-CM | POA: Diagnosis not present

## 2022-11-24 DIAGNOSIS — M170 Bilateral primary osteoarthritis of knee: Secondary | ICD-10-CM | POA: Diagnosis not present

## 2022-11-24 DIAGNOSIS — H04123 Dry eye syndrome of bilateral lacrimal glands: Secondary | ICD-10-CM | POA: Diagnosis not present

## 2022-11-24 DIAGNOSIS — N401 Enlarged prostate with lower urinary tract symptoms: Secondary | ICD-10-CM | POA: Diagnosis not present

## 2022-11-24 DIAGNOSIS — R351 Nocturia: Secondary | ICD-10-CM | POA: Diagnosis not present

## 2022-12-03 DIAGNOSIS — M170 Bilateral primary osteoarthritis of knee: Secondary | ICD-10-CM | POA: Diagnosis not present

## 2023-03-03 DIAGNOSIS — L570 Actinic keratosis: Secondary | ICD-10-CM | POA: Diagnosis not present

## 2023-03-03 DIAGNOSIS — D485 Neoplasm of uncertain behavior of skin: Secondary | ICD-10-CM | POA: Diagnosis not present

## 2023-03-03 DIAGNOSIS — C4442 Squamous cell carcinoma of skin of scalp and neck: Secondary | ICD-10-CM | POA: Diagnosis not present

## 2023-03-03 DIAGNOSIS — D2339 Other benign neoplasm of skin of other parts of face: Secondary | ICD-10-CM | POA: Diagnosis not present

## 2023-04-08 DIAGNOSIS — H04123 Dry eye syndrome of bilateral lacrimal glands: Secondary | ICD-10-CM | POA: Diagnosis not present

## 2023-04-08 DIAGNOSIS — H0102A Squamous blepharitis right eye, upper and lower eyelids: Secondary | ICD-10-CM | POA: Diagnosis not present

## 2023-04-08 DIAGNOSIS — H0102B Squamous blepharitis left eye, upper and lower eyelids: Secondary | ICD-10-CM | POA: Diagnosis not present

## 2023-04-27 ENCOUNTER — Other Ambulatory Visit: Payer: Self-pay

## 2023-08-12 DIAGNOSIS — E785 Hyperlipidemia, unspecified: Secondary | ICD-10-CM | POA: Diagnosis not present

## 2023-08-12 DIAGNOSIS — R946 Abnormal results of thyroid function studies: Secondary | ICD-10-CM | POA: Diagnosis not present

## 2023-08-13 DIAGNOSIS — H04123 Dry eye syndrome of bilateral lacrimal glands: Secondary | ICD-10-CM | POA: Diagnosis not present

## 2023-08-13 DIAGNOSIS — H0102A Squamous blepharitis right eye, upper and lower eyelids: Secondary | ICD-10-CM | POA: Diagnosis not present

## 2023-08-13 DIAGNOSIS — E119 Type 2 diabetes mellitus without complications: Secondary | ICD-10-CM | POA: Diagnosis not present

## 2023-08-13 DIAGNOSIS — H25813 Combined forms of age-related cataract, bilateral: Secondary | ICD-10-CM | POA: Diagnosis not present

## 2023-08-18 ENCOUNTER — Ambulatory Visit: Payer: Medicare HMO | Attending: Cardiovascular Disease | Admitting: Cardiovascular Disease

## 2023-08-18 ENCOUNTER — Encounter: Payer: Self-pay | Admitting: Cardiovascular Disease

## 2023-08-18 VITALS — BP 120/72 | HR 62 | Ht 73.5 in | Wt 189.2 lb

## 2023-08-18 DIAGNOSIS — I480 Paroxysmal atrial fibrillation: Secondary | ICD-10-CM

## 2023-08-18 DIAGNOSIS — E782 Mixed hyperlipidemia: Secondary | ICD-10-CM

## 2023-08-18 DIAGNOSIS — I10 Essential (primary) hypertension: Secondary | ICD-10-CM | POA: Diagnosis not present

## 2023-08-18 DIAGNOSIS — E119 Type 2 diabetes mellitus without complications: Secondary | ICD-10-CM | POA: Diagnosis not present

## 2023-08-18 DIAGNOSIS — Z7901 Long term (current) use of anticoagulants: Secondary | ICD-10-CM | POA: Diagnosis not present

## 2023-08-18 MED ORDER — ROSUVASTATIN CALCIUM 20 MG PO TABS: 20.0000 mg | ORAL_TABLET | Freq: Every day | ORAL | 3 refills | Status: AC

## 2023-08-18 MED ORDER — APIXABAN 5 MG PO TABS: ORAL_TABLET | ORAL | 3 refills | Status: DC

## 2023-08-18 MED ORDER — METOPROLOL SUCCINATE ER 50 MG PO TB24: ORAL_TABLET | ORAL | 3 refills | Status: AC

## 2023-08-18 NOTE — Patient Instructions (Signed)
 Medication Instructions:  No changes *If you need a refill on your cardiac medications before your next appointment, please call your pharmacy*  Lab Work: No labs If you have labs (blood work) drawn today and your tests are completely normal, you will receive your results only by: MyChart Message (if you have MyChart) OR A paper copy in the mail If you have any lab test that is abnormal or we need to change your treatment, we will call you to review the results.  Testing/Procedures: No testing  Follow-Up: At [facility], you and your health needs are our priority.  As part of our continuing mission to provide you with exceptional heart care, our providers are all part of one team.  This team includes your primary Cardiologist (physician) and Advanced Practice Providers or APPs (Physician Assistants and Nurse Practitioners) who all work together to provide you with the care you need, when you need it.  Your next appointment:   1 year(s)  Provider:   Rosalin Colorado MD  We recommend signing up for the patient portal called "MyChart".  Sign up information is provided on this After Visit Summary.  MyChart is used to connect with patients for Virtual Visits (Telemedicine).  Patients are able to view lab/test results, encounter notes, upcoming appointments, etc.  Non-urgent messages can be sent to your provider as well.   To learn more about what you can do with MyChart, go to [email or web address].

## 2023-08-18 NOTE — Progress Notes (Unsigned)
 Patient ID: Victor Sanchez, male   DOB: [birth year], 83 y.o.   MRN: [id number]    Primary MD: Dr. Kandyce Sanchez  HPI: [name] is an 83 year old white male who presents for an 5 month follow-up cardiology evaluation.  [name] developed atypical chest pain in 1999. A nuclear perfusion study raised the possibility of focal inferior scar with possible inferolateral redistribution. Cardiac catheterization revealed normal LV function and normal coronary arteries. In 2008 a nuclear study showed diaphragmatic attenuation although the ormal interpretation was interpreted as possible ischemia. In January 2015 he developed URI symptoms and had  significant difficulty with sleep. He also had significant deviated septum, and has had episodes with paroxysmal positional vertigo. As part of his preoperative evaluation for ENT surgery he was found to have possible abnormal heart rhythms. He underwent his ENT surgery for with a nasal septoplasty, reduction of his inferior turbinates, and LAUP. When Dr. Archer Sanchez saw him  he was concerned about the possibility of atrial fibrillation.  He reestablished care with me last year following this episode.  At that time prior to a planned trip to [place] for 2 weeks I recommended that he undergo laboratory as well as wear a CardioNet monitor.  Blood work was abnormal in that his total cholesterol was 203, triglycerides 398, HDL 51, VLDL 80 LDL 72.  Magnesium was normal.  His fasting glucose was 165 and the hemoglobin A1c was elevated at 8.0 indicative of a mean plasma glucose of approximately 183.  He had normal hemoglobin and hematocrit.  A 2-D echo Doppler study revealed an ejection fraction in the 55-60%.  On his monitor, he developed recurrent episodes of paroxysmal atrial fibrillation with rates in the low 100s, but in one instance  he developed a 10 beat run of PSVT approximately 178 beats per minute.  He was contacted regarding these results and was brought into the office on  06/23/2013.  An echo Doppler  revealed mild aortic valve sclerosis, mild tricuspid regurgitation, and mild MR, with normal systolic function.  His laboratory was suggestive of type 2 diabetes mellitus.  With his episodes of PAF noted on his monitor.  I recommended institution of low-dose Toprol  initially at 25 mg and titrate this to 50 mg.  I also extended his cardiac monitor beyond the initial 10 days.  I recommended the addition of fenofibrate  145 mg in light of his significant triglyceride elevation and also suggested initiation of metformin  500 mg daily with plans for primary care followup.  The subsequent CardioNet monitor did not demonstrate any further episodes of PAF on his increased beta blocker therapy although he did have some sinus tachycardia during activity.  Repeat blood work was significantly improved with his fasting glucose improved from 165, and remotely, 202.  Liver function studies were normal.  Renal function was normal.  In August 2015 he had continued to be very dedicated both with exercise, as well as improved diet.  He was walking 3-4 miles per day and lost approximately 25 pounds.  He was unaware of any arrhythmia.  He denies chest pain or shortness of breath.  Followup blood work revealed marked improvement in his hemoglobin A1c from 8.0-6.1.  His lipid studies were markedly improved with triglycerides being reduced to 96 from 398.  An NMR lipoprotein showed an LDL particle number was excellent at 799, with calculated LDL 78.  HDL particle number was 37.9.  He was no longer in resistant with an insulin resistance score 37.    When  I saw him  in February 2016 he complained that his chest would quiver on the left and he would notice some occasional pounding from within.  He was uncertain if he was having potential breakthrough atrial fibrillation or for these were just muscle spasms.  They have been occurring daily.  The episodes would last 5-6 seconds and then resolve.  He denied  associated presyncope or syncope or nocturnal symptoms.  At that time, I felt most likely this symptomatology was due to pectoralis muscle fasciculations rather than recurrent AF.  He wore a 2 week event monitor from 04/19/2014 through 05/02/2014.  This did not demonstrate any significant arrhythmia, but showed predominantly sinus rhythm with sinus bradycardia and rare PACs and rare isolated PVCs.  Assessment laboratory was also done which I reviewed with him in detail today.  His hemoglobin and hematocrit were stable.  Fasting glucose was mildly increased at 126.  Normal renal function and electrolytes.  TSH was borderline increased at 4.7.  His studies were normal.  Lipid studies continue to be excellent with a total cholesterol of 171, triglycerides 90, HDL 57, and LDL 96, on his current therapy.    When I saw him in September 2016, he was doing  well.  He went to [place] and [place].   Later he underwent arthroscopic knee surgery which took him 3-4 months to recover.  As result, his exercise has been reduced.  He is unaware of any recurrent arrhythmia.  In September 2016 LDL cholesterol was 88.  Total cholesterol 160, triglycerides 102.   Blood work from May 2017 had shown an increase in his hemoglobin A1c to 7.0.  Over the past 6 months, he has made a conscious effort to have improved diet.  He is exercising regularly.  He denies chest pain.  He denies palpitations.  He underwent repeat blood work last week which showed a normal CBC.  Hemoglobin A1c has improved to 6.5.  Total cholesterol was 178, triglycerides 102, his LDL was 95.  He has continued to be on fenofibrate  145 mg and pravastatin 40 mg.    I saw him in October 2018 at which time he was remaining stable.  He was active and exercise regularly.  He had been successful with weight loss and his weight is down to 202 pounds. He traveled to [place] and the [employer].  He has had some issues with his left knee and was contemplating surgery  but ultimately this was aborted.  He is unaware of any recurrent atrial fibrillation.   Victor Sanchez checked his blood work and his hemoglobin A1c was 6.3.  He was on Toprol -XL 50 mg with control of palpitations or arrhythmia; eliquis  for anticoagulation.;combination rosuvastatin  10 mg and fenofibrate  145 mg for mixed hyperlipidemia.  He was on Proscar  5 mg for his prostate and Flomax .    I saw him in October 2019 at which time he continued to remain stable from a cardiac standpoint.  He had recently returned from a three-week trip to [place] and had gained 5 pounds during the excursion.  He denied any chest pain or palpitations.  Repeat laboratory from January 08, 2018 showed total cholesterol 142, triglycerides 94, HDL 64, LDL 59.  Hemoglobin hematocrit were stable.  Glucose was increased at 141.  He had normal renal function and LFTs.  TSH was minimally increased at 5.2.  Hemoglobin A1c was slightly elevated at 6.7.    I saw him in December 2020 at which time he had remained  cardiovascularly stable.  He has had purposeful weight loss and compared to his peak weight he has lost over 40 pounds.  Since his prior evaluation he had lost 16 pounds.  He denies any chest pain.  He is unaware of any recurrent atrial fibrillation.  He has had difficulty with his blood sugar and is eating a proper diet.  Laboratory in August 2020 had shown his hemoglobin A1c increased at 8.1.  He had follow-up laboratory on February 15, 2019.  Fasting glucose was 153.  Renal function was normal with a BUN of 18 creatinine 0.91.  He was not anemic.  Lipid studies were excellent with total cholesterol 142, triglycerides 91, HDL 64, and LDL 61 on rosuvastatin  10 mg in addition to fenofibrate .  He continues to be on apixaban  and denies any bleeding.  He has been taking Toprol -XL 50 mg daily and denies palpitations.   Since I last saw him, he was evaluated by Lawana Pray, NP in August 2021 for preoperative cardiac evaluation at that time he  remained physically active and was walking 2 miles per day and doing yard work.  He was unaware of any recurrent palpitations or atrial fibrillation.  I last saw him on Jul 17, 2020 and he had recently been  reevaluated by Dr. Bernetta Brilliant his primary physician.  Over the past year, his hemoglobin A1c has increased and in June 2021 was 7.5, was slightly improved in September at 6.9, but most recently on July 04, 2019 had risen to 9.1.  He is scheduled to initiate therapy with Ozempic  next week at [facility].  He denies any chest pain.  He continues to walk daily.  He denies any palpitations or awareness of atrial fibrillation.  Recent lipid studies showed total cholesterol 168 triglycerides 102 HDL 60 LDL 88 and he has been on rosuvastatin  10 mg, fenofibrate  145 mg.  His blood pressure has been stable on metoprolol  XL 50 mg daily and he is on Eliquis  5 mg twice a day with his history of PAF.    Since I last saw him, he has had purposeful weight loss with his weight reducing from 210 down to 184 pounds.  He was started on Ozempic  for diabetes in August 2022 which resulted in significant reduction in appetite.  He denies any chest pain or shortness of breath.  He is unaware of recurrent atrial fibrillation.  He underwent an echo Doppler study on Jul 16, 2021 which remained stable with a EF of 55 to 60% with normal diastolic function with minimal dilatation of his LV internal cavity.  He and his wife have been traveling and over the past year had gone to [place] and will be planning a trip to [place] in [place] and a future trip to [place].  He presents for evaluation.  Past Medical History:  Diagnosis Date   Diabetes mellitus without complication (HCC)    Enlarged prostate    Headache(784.0)    occ.   History of asthma    as a child   Hyperlipidemia    Hypertension    Sleep apnea    2013    Past Surgical History:  Procedure Laterality Date   APPENDECTOMY  1968   CARDIAC CATHETERIZATION   09/06/1997   normal LV function, normal coronaries (Dr. Magnus Schuller)   KNEE ARTHROSCOPY  1990'   with baker's cyst excision unsure side   NASAL SEPTOPLASTY W/ TURBINOPLASTY Bilateral 05/25/2013   Procedure: NASAL SEPTOPLASTY WITH TURBINATE REDUCTION;  Surgeon: Lenton Rail,  MD;  Location: [facility] OR;  Service: ENT;  Laterality: Bilateral;   NASOPHARYNGOSCOPY  1972   cyst removal   NM MYOCAR PERF WALL MOTION  03/2006   bruce myoview - mild-mod perfusion defect in basal inferoseptal walls with mild reversibility at rest; mild to mod defect in mid inferior wall with mild reversibility at rest; EF 63%; intermediate risk scan   TONSILLECTOMY     as a child   TRANSTHORACIC ECHOCARDIOGRAM  03/2006   EF=>55%, mild conc LVH; borderline RV/RA enlargement; borderline MVP, prolapse of anterior leaflets, mild MR; mild TR; AV mildly sclerotic   UVULECTOMY N/A 05/25/2013   Procedure: Lynetta Saran ASSISTED;  Surgeon: Lenton Rail, MD;  Location: [facility] OR;  Service: ENT;  Laterality: N/A;   VENTRAL HERNIA REPAIR     x 2    No Known Allergies  Current Outpatient Medications  Medication Sig Dispense Refill   apixaban  (ELIQUIS ) 5 MG TABS tablet TAKE 1 TABLET(5 MG) BY MOUTH TWICE DAILY 180 tablet 3   finasteride  (PROSCAR ) 5 MG tablet Take 5 mg by mouth daily.     metoprolol  succinate (TOPROL -XL) 50 MG 24 hr tablet TAKE 1 TABLET BY MOUTH EVERY DAY WITH OR IMMEDIATELY FOLLOWING A MEAL 90 tablet 3   Multiple Vitamins-Minerals (MULTIVITAMIN WITH MINERALS) tablet Take 1 tablet by mouth daily.     rosuvastatin  (CRESTOR ) 20 MG tablet TAKE 1 TABLET(20 MG) BY MOUTH DAILY 90 tablet 3   Semaglutide , 1 MG/DOSE, (OZEMPIC , 1 MG/DOSE,) 4 MG/3ML SOPN Inject 1 mg into the skin once a week. 3 mL 3   tamsulosin  (FLOMAX ) 0.4 MG CAPS capsule Take 0.4 mg by mouth daily.     fenofibrate  (TRICOR ) 145 MG tablet TAKE 1 TABLET(145 MG) BY MOUTH DAILY (Patient not taking: Reported on 08/18/2023) 90 tablet 3   No current  facility-administered medications for this visit.    Socially he is married. He has 3 children 4 grandchildren. There is no tobacco use. He did smoke for short-term and quit in 1969. He does drink alcohol .  Family History  Problem Relation Age of Onset   Kidney failure Mother    Emphysema Father    Heart disease Other    Breast cancer Sister    Multiple myeloma Sister      ROS General: Negative; No fevers, chills, or night sweats; positive for purposeful 6 pound weight loss HEENT: Negative; No changes in vision or hearing, sinus congestion, difficulty swallowing Pulmonary: Negative; No cough, wheezing, shortness of breath, hemoptysis Cardiovascular: See history of present illness GI: Negative; No nausea, vomiting, diarrhea, or abdominal pain GU: Negative; No dysuria, hematuria, or difficulty voiding Musculoskeletal: Negative; no myalgias, joint pain, or weakness Hematologic/Oncology: Negative; no easy bruising, bleeding Endocrine: Positive for increasing hemoglobin A1c consistent with overt diabetes mellitus Neuro: Negative; no changes in balance, headaches Skin: Negative; No rashes or skin lesions Psychiatric: Negative; No behavioral problems, depression Sleep: Positive for sleep apnea, currently not on CPAP, but status post recent ENT surgery; No snoring, daytime sleepiness, hypersomnolence, bruxism, restless legs, hypnogognic hallucinations, no cataplexy Other comprehensive 14 point system review is negative.   PE BP 120/72   Pulse 62   Ht 6' 1.5" (1.867 m)   Wt 189 lb 3.2 oz (85.8 kg)   SpO2 97%   BMI 24.62 kg/m    Repeat blood pressure by me was 122/60  Wt Readings from Last 3 Encounters:  08/18/23 189 lb 3.2 oz (85.8 kg)  09/25/22 185 lb 9.6 oz (84.2 kg)  08/07/21 184 lb 12.8 oz (83.8 kg)   General: Alert, oriented, no distress.  Skin: normal turgor, no rashes, warm and dry HEENT: Normocephalic, atraumatic. Pupils equal round and reactive to light; sclera  anicteric; extraocular muscles intact;  Nose without nasal septal hypertrophy Mouth/Parynx benign; Mallinpatti scale 3 Neck: No JVD, no carotid bruits; normal carotid upstroke Lungs: clear to ausculatation and percussion; no wheezing or rales Chest wall: without tenderness to palpitation Heart: PMI not displaced, RRR, s1 s2 normal, 1/6 systolic murmur, no diastolic murmur, no rubs, gallops, thrills, or heaves Abdomen: soft, nontender; no hepatosplenomehaly, BS+; abdominal aorta nontender and not dilated by palpation. Back: no CVA tenderness Pulses 2+ Musculoskeletal: full range of motion, normal strength, no joint deformities Extremities: no clubbing cyanosis or edema, Homan's sign negative  Neurologic: grossly nonfocal; Cranial nerves grossly wnl Psychologic: Normal mood and affect  EKG Interpretation Date/Time:  Tuesday August 18 2023 09:40:54 EDT Ventricular Rate:  62 PR Interval:  160 QRS Duration:  94 QT Interval:  428 QTC Calculation: 434 R Axis:   21  Text Interpretation: Unusual P axis, possible ectopic atrial rhythm with Premature atrial complexes When compared with ECG of 25-Sep-2022 10:36, Premature ventricular complexes are no longer Present Confirmed by Magnus Schuller ([id number]) on 08/18/2023 10:23:29 AM    Aug 07, 2021 ECG (independently read by me): Normal sinus rhythm at 62 bpm, PAC.  Normal intervals.  No ST segment changes  Jul 17, 2020 ECG (independently read by me): Sinus bradycardia 56 bpm.  No significant ST-T abnormalities.  Normal intervals.  No ectopy  December 2020 ECG (independently read by me): Sinus bradycardia 58 bpm.  Nonspecific ST changes.  Normal intervals.  No ectopy.  October 2019 ECG (independently read by me): Normal sinus rhythm at 60 bpm.  PAC.  Nonspecific ST changes.  October 2018 ECG (independently read by me): sinus bradycardia at 62 bpm.  Premature ventricular contractions.  Normal intervals.  No ST segment changes.  December 2017 ECG  (independently read by me): Normal sinus rhythm at 68 bpm.  Rare PAC, occasional PVC.  Nonspecific ST changes.  April 2017 ECG (independently read by me): Normal sinus rhythm with PAC pattern of atrial bigeminy  September 2016 ECG (independently read by me): Sinus rhythm with PAC's, atrial bigeminy for which he is entirely asymptomatic.  05/10/2014 ECG (independently read by me): Sinus rhythm with sinus arrhythmia and  PACs with an atrial bigeminal rhythm  04/19/2014 ECG (independently read by me): Normal sinus rhythm at 61 bpm.  One isolated PVC.  No significant ST segment changes.  Normal intervals.  Prior ECG (independently read by me) normal sinus rhythm at 64 beats per minute.  Normal intervals.  No ST segment changes.  ECG on 06/23/13 (independently read by me) normal sinus rhythm at 63 beats per minute.  PR interval 150 ms, QTc interval 433 ms  Prior 06/15/2013 ECG (independently read by me): Normal sinus rhythm at 72 beats per minute. PR interval 144 ms; QTc interval 451 ms  LABS:     Latest Ref Rng & Units 02/15/2019    8:30 AM 01/12/2018    8:15 AM 02/25/2016    9:51 AM  BMP  Glucose 65 - 99 mg/dL 841  324  401   BUN 8 - 27 mg/dL 18  19  21    Creatinine 0.76 - 1.27 mg/dL 0.27  2.53  6.64   BUN/Creat Ratio 10 - 24 20  20     Sodium 134 - 144 mmol/L 139  140  139   Potassium 3.5 - 5.2 mmol/L 4.6  4.9  4.7   Chloride 96 - 106 mmol/L 104  102  105   CO2 20 - 29 mmol/L 22  23  27    Calcium  8.6 - 10.2 mg/dL 9.6  9.6  9.3        Latest Ref Rng & Units 02/15/2019    8:30 AM 01/12/2018    8:15 AM 02/25/2016    9:51 AM  Hepatic Function  Total Protein 6.0 - 8.5 g/dL 6.6  6.7  6.6   Albumin  3.7 - 4.7 g/dL 4.4  4.6  4.2   AST 0 - 40 IU/L 19  24  20    ALT 0 - 44 IU/L 19  22  23    Alk Phosphatase 39 - 117 IU/L 40  34  32   Total Bilirubin 0.0 - 1.2 mg/dL 0.5  0.4  0.5        Latest Ref Rng & Units 02/15/2019    8:30 AM 01/12/2018    8:15 AM 02/25/2016    9:51 AM  CBC  WBC  3.4 - 10.8 x10E3/uL 6.5  7.1  6.6   Hemoglobin 13.0 - 17.7 g/dL 29.5  62.1  30.8   Hematocrit 37.5 - 51.0 % 41.5  41.6  42.9   Platelets 150 - 450 x10E3/uL 235  231  244    Lab Results  Component Value Date   MCV 94 02/15/2019   MCV 90 01/12/2018   MCV 90.1 02/25/2016   Lab Results  Component Value Date   TSH 4.380 02/15/2019   Lab Results  Component Value Date   HGBA1C 6.7 (H) 01/12/2018   No results found for: "PROBNP"  Lipid Panel     Component Value Date/Time   CHOL 142 02/15/2019 0830   CHOL 152 10/25/2013 0804   TRIG 91 02/15/2019 0830   TRIG 96 10/25/2013 0804   HDL 64 02/15/2019 0830   HDL 55 10/25/2013 0804   CHOLHDL 2.2 02/15/2019 0830   CHOLHDL 2.8 02/25/2016 0803   VLDL 20 02/25/2016 0803   LDLCALC 61 02/15/2019 0830   LDLCALC 78 10/25/2013 0804    NMR Profile 10/25/2013   Ref Range 39mo ago  54mo ago       LDL Particle Number <1000 nmol/L 799     Comments: Reference Range   Low < 1000   Moderate 1000 - 1299   Borderline-High 1300 - 1599   High 1600 - 2000   Very High > 2000      LDL (calc) <100 mg/dL 78 65H, CM    Comments: LDL-C is inaccurate if patient is nonfasting. Reference Range   Optimal < 100   Near/Above Optimal 100 - 129   Borderline High 130 - 159   High 160 - 189   Very High >= 190      HDL-C >=40 mg/dL 55      Triglycerides <846 mg/dL 96 962 (H)     Cholesterol, Total <200 mg/dL 952      HDL Particle Number >=30.5 umol/L 37.9      Large HDL-P >=4.8 umol/L 7.6      Large VLDL-P <=2.7 nmol/L 2.5      Small LDL Particle Number <=527 nmol/L 275      LDL Size >20.5 nm 20.9      HDL Size >=9.2 nm 9.0 (L)      VLDL Size <=46.6 nm 41.4      LP-IR  Score <=45  37       RADIOLOGY: No results found.  ECHO: 07/16/2021  1. Left ventricular ejection fraction, by estimation, is 55 to 60%. The  left ventricle has normal function. The left ventricle has no regional  wall motion abnormalities. The left ventricular internal cavity size  was  mildly dilated. Left ventricular  diastolic parameters were normal.   2. Right ventricular systolic function is normal. The right ventricular  size is normal. Tricuspid regurgitation signal is inadequate for assessing  PA pressure.   3. The mitral valve is grossly normal. Mild mitral valve regurgitation.  No evidence of mitral stenosis.   4. The aortic valve is grossly normal. Aortic valve regurgitation is  trivial. No aortic stenosis is present.   5. The inferior vena cava is normal in size with greater than 50%  respiratory variability, suggesting right atrial pressure of 3 mmHg.   Comparison(s): Report only 11/21/14 EF >55%.    IMPRESSION:  1. PAF (paroxysmal atrial fibrillation) (HCC)   2. Essential hypertension   3. Mixed hyperlipidemia      ASSESSMENT AND PLAN: [name] is a young appearing 83 year-old gentleman who had  normal coronary arteries and normal LV function at cardiac catheterization in1999 after a nuclear perfusion study raised the possibility of subtle ischemia. In 2015, he had developed overt diabetes, and also had episodes of paroxysmal atrial fibrillation.  With significant lifestyle adjustment he initially lost approximately 25 pounds, his blood pressure  normalized and his hemoglobin A1c significantly improved.  He subsequently has lost almost 40 pounds and his weight today was 210 when seen in May 2022.  However, despite being very careful with his diet, it appears that he has developed progressive diabetes mellitus with most recent hemoglobin A1c increasing to 9.1.  Since I last saw him, he initiated Ozempic  for diabetic management in August 2022.  Subsequently, he has significantly lost additional weight with his weight today down at 184 and BMI at 24.4.  His blood pressure today is excellent.  Repeat by me was 122/60.  He continues to be on metoprolol  succinate 50 mg daily.  He is unaware of any recurrent atrial fibrillation and continues to be on Eliquis .   He continues to be on rosuvastatin  20 mg and fenofibrate  145 mg for mixed hyperlipidemia.  He has slightly reduced his dose of Ozempic  and now takes three-quarter of an injection.  His ECG remained stable.  He is traveling extensively with his wife.  I reviewed his most recent echo Doppler study from Jul 16, 2021 which remained stable with EF at 55 to 60% and mild MR.  There was mildly increased LV internal cavity size.  There is no LVH and abnormal diastolic function.  As long as he is stable I will see him in 1 year for reevaluation or sooner as needed.   Millicent Ally, MD, FACC 08/18/2023 10:23 AM

## 2023-08-19 DIAGNOSIS — Z1331 Encounter for screening for depression: Secondary | ICD-10-CM | POA: Diagnosis not present

## 2023-08-19 DIAGNOSIS — R82998 Other abnormal findings in urine: Secondary | ICD-10-CM | POA: Diagnosis not present

## 2023-08-19 DIAGNOSIS — Z1339 Encounter for screening examination for other mental health and behavioral disorders: Secondary | ICD-10-CM | POA: Diagnosis not present

## 2023-08-20 ENCOUNTER — Encounter: Payer: Self-pay | Admitting: Cardiovascular Disease

## 2023-08-24 DIAGNOSIS — R350 Frequency of micturition: Secondary | ICD-10-CM | POA: Diagnosis not present

## 2023-08-24 DIAGNOSIS — R3915 Urgency of urination: Secondary | ICD-10-CM | POA: Diagnosis not present

## 2023-08-24 DIAGNOSIS — R351 Nocturia: Secondary | ICD-10-CM | POA: Diagnosis not present

## 2023-08-24 DIAGNOSIS — N401 Enlarged prostate with lower urinary tract symptoms: Secondary | ICD-10-CM | POA: Diagnosis not present

## 2023-08-26 ENCOUNTER — Other Ambulatory Visit: Payer: Self-pay | Admitting: Urology

## 2023-08-26 ENCOUNTER — Telehealth: Payer: Self-pay | Admitting: Cardiovascular Disease

## 2023-08-26 NOTE — Telephone Encounter (Signed)
   Pre-operative Risk Assessment    Patient Name: Victor Sanchez  DOB: 04/04/1940 MRN: [id number]   Date of last office visit: 08/18/2023 Date of next office visit: TBD   Request for Surgical Clearance    Procedure:  Aquablation of the prostate  Date of Surgery:  Clearance 09/08/23                                Surgeon:  Dr. Christina Coyer Surgeon's Group or Practice Name:  [facility]  Phone number:  [phone] [phone] Fax number:  [phone]   Type of Clearance Requested:   - Medical  - Pharmacy:  Hold Apixaban  (Eliquis ) 2 days prior    Type of Anesthesia:  General    Additional requests/questions:    [name]   08/26/2023, 9:42 AM

## 2023-08-27 NOTE — Telephone Encounter (Signed)
 Patient with diagnosis of afib on Eliquis  for anticoagulation.    Procedure: Aquablation of the prostate  Date of procedure: 09/08/23   CHA2DS2-VASc Score = 3   This indicates a 3.2% annual risk of stroke. The patient's score is based upon: CHF History: 0 HTN History: 0 Diabetes History: 1 Stroke History: 0 Vascular Disease History: 0 Age Score: 2 Gender Score: 0     CrCl 80 mL/min Platelet count 224  Patient has not had an Afib/aflutter ablation within the last 3 months or DCCV within the last 30 days   Per office protocol, patient can hold Eliquis  for 2 days prior to procedure.   Patient will not need bridging with Lovenox (enoxaparin) around procedure.  **This guidance is not considered finalized until pre-operative APP has relayed final recommendations.**

## 2023-08-31 NOTE — Telephone Encounter (Signed)
   Name: Victor Sanchez  DOB: Jun 15, 1940  MRN: [id number]   Primary Cardiologist: Magnus Schuller, MD  Chart reviewed as part of pre-operative protocol coverage. [name] was last seen on 08/18/2023 by Dr. Loetta Ringer.  He was doing well with no concerning cardiac complaints.  Spoke with patient via telephone today and he continues to do well.  He has not developed concerning cardiac symptoms.  Therefore, based on ACC/AHA guidelines, the patient would be an acceptable risk for the planned procedure without further cardiovascular testing.   Per Pharm D, patient may hold Eliquis  for 2 days prior to procedure. Patient will not need bridging with Lovenox around procedure.      I will route this recommendation to the requesting party via Epic fax function and remove from pre-op pool. Please call with questions.  Morey Ar, NP 08/31/2023, 4:33 PM

## 2023-09-01 NOTE — Patient Instructions (Signed)
 SURGICAL WAITING ROOM VISITATION  Patients having surgery or a procedure may have no more than 2 support people in the waiting area - these visitors may rotate.    Children under the age of 34 must have an adult with them who is not the patient.  Visitors with respiratory illnesses are discouraged from visiting and should remain at home.  If the patient needs to stay at the hospital during part of their recovery, the visitor guidelines for inpatient rooms apply. Pre-op nurse will coordinate an appropriate time for 1 support person to accompany patient in pre-op.  This support person may not rotate.    Please refer to the [facility] website for the visitor guidelines for Inpatients (after your surgery is over and you are in a regular room).       Your procedure is scheduled on: 09/08/23   Report to [facility] Main Entrance    Report to admitting at: 9:45 AM   Call this number if you have problems the morning of surgery [phone]   Do not eat food :After Midnight.   After Midnight you may have the following liquids until : 9:00 AM DAY OF SURGERY  Water  Non-Citrus Juices (without pulp, NO RED-Apple, White grape, White cranberry) Black Coffee (NO MILK/CREAM OR CREAMERS, sugar ok)  Clear Tea (NO MILK/CREAM OR CREAMERS, sugar ok) regular and decaf                             Plain Jell-O (NO RED)                                           Fruit ices (not with fruit pulp, NO RED)                                     Popsicles (NO RED)                                                               Sports drinks like Gatorade (NO RED)            FOLLOW ANY ADDITIONAL PRE OP INSTRUCTIONS YOU RECEIVED FROM YOUR SURGEON'S OFFICE!!!   Oral Hygiene is also important to reduce your risk of infection.                                    Remember - BRUSH YOUR TEETH THE MORNING OF SURGERY WITH YOUR REGULAR TOOTHPASTE  DENTURES WILL BE REMOVED PRIOR TO SURGERY PLEASE DO NOT APPLY Poly  grip OR ADHESIVES!!!   Do NOT smoke after Midnight   Stop all vitamins and herbal supplements 7 days before surgery.   Take these medicines the morning of surgery with A SIP OF WATER :metoprolol ,finasteride ,tamsulosin .  How to Manage Your Diabetes Before and After Surgery  Why is it important to control my blood sugar before and after surgery? Improving blood sugar levels before and after surgery helps healing and can limit problems. A way of improving  blood sugar control is eating a healthy diet by:  Eating less sugar and carbohydrates  Increasing activity/exercise  Talking with your doctor about reaching your blood sugar goals High blood sugars (greater than 180 mg/dL) can raise your risk of infections and slow your recovery, so you will need to focus on controlling your diabetes during the weeks before surgery. Make sure that the doctor who takes care of your diabetes knows about your planned surgery including the date and location.  How do I manage my blood sugar before surgery? Check your blood sugar at least 4 times a day, starting 2 days before surgery, to make sure that the level is not too high or low. Check your blood sugar the morning of your surgery when you wake up and every 2 hours until you get to the Short Stay unit. If your blood sugar is less than 70 mg/dL, you will need to treat for low blood sugar: Do not take insulin. Treat a low blood sugar (less than 70 mg/dL) with  cup of clear juice (cranberry or apple), 4 glucose tablets, OR glucose gel. Recheck blood sugar in 15 minutes after treatment (to make sure it is greater than 70 mg/dL). If your blood sugar is not greater than 70 mg/dL on recheck, call [phone] for further instructions. Report your blood sugar to the short stay nurse when you get to Short Stay.  If you are admitted to the hospital after surgery: Your blood sugar will be checked by the staff and you will probably be given insulin after surgery  (instead of oral diabetes medicines) to make sure you have good blood sugar levels. The goal for blood sugar control after surgery is 80-180 mg/dL.   WHAT DO I DO ABOUT MY DIABETES MEDICATION?      THE MORNING OF SURGERY, DO NOT TAKE ANY ORAL DIABETIC MEDICATIONS DAY OF YOUR SURGERY  DO NOT TAKE THE FOLLOWING 7 DAYS PRIOR TO SURGERY: Ozempic , Wegovy , Rybelsus  (Semaglutide ), Byetta (exenatide), Bydureon (exenatide ER), Victoza, Saxenda (liraglutide), or Trulicity (dulaglutide) Mounjaro (Tirzepatide) Adlyxin (Lixisenatide), Polyethylene Glycol Loxenatide. HOLD Semaglutide  after: 08/31/23                              You may not have any metal on your body including hair pins, jewelry, and body piercing             Do not wear lotions, powders, perfumes/cologne, or deodorant              Men may shave face and neck.   Do not bring valuables to the hospital. [facility] IS NOT             RESPONSIBLE   FOR VALUABLES.   Contacts, glasses, dentures or bridgework may not be worn into surgery.   Bring small overnight bag day of surgery.   DO NOT BRING YOUR HOME MEDICATIONS TO THE HOSPITAL. PHARMACY WILL DISPENSE MEDICATIONS LISTED ON YOUR MEDICATION LIST TO YOU DURING YOUR ADMISSION IN THE HOSPITAL!    Patients discharged on the day of surgery will not be allowed to drive home.  Someone NEEDS to stay with you for the first 24 hours after anesthesia.   Special Instructions: Bring a copy of your healthcare power of attorney and living will documents the day of surgery if you haven't scanned them before.              Please read over the following  fact sheets you were given: IF YOU HAVE QUESTIONS ABOUT YOUR PRE-OP INSTRUCTIONS PLEASE CALL [phone]   If you received a COVID test during your pre-op visit  it is requested that you wear a mask when out in public, stay away from anyone that may not be feeling well and notify your surgeon if you develop symptoms. If you test positive for Covid or  have been in contact with anyone that has tested positive in the last 10 days please notify you surgeon.    [place] - Preparing for Surgery Before surgery, you can play an important role.  Because skin is not sterile, your skin needs to be as free of germs as possible.  You can reduce the number of germs on your skin by washing with CHG (chlorahexidine gluconate) soap before surgery.  CHG is an antiseptic cleaner which kills germs and bonds with the skin to continue killing germs even after washing. Please DO NOT use if you have an allergy to CHG or antibacterial soaps.  If your skin becomes reddened/irritated stop using the CHG and inform your nurse when you arrive at Short Stay. Do not shave (including legs and underarms) for at least 48 hours prior to the first CHG shower.  You may shave your face/neck. Please follow these instructions carefully:  1.  Shower with CHG Soap the night before surgery and the  morning of Surgery.  2.  If you choose to wash your hair, wash your hair first as usual with your  normal  shampoo.  3.  After you shampoo, rinse your hair and body thoroughly to remove the  shampoo.                           4.  Use CHG as you would any other liquid soap.  You can apply chg directly  to the skin and wash                       Gently with a scrungie or clean washcloth.  5.  Apply the CHG Soap to your body ONLY FROM THE NECK DOWN.   Do not use on face/ open                           Wound or open sores. Avoid contact with eyes, ears mouth and genitals (private parts).                       Wash face,  Genitals (private parts) with your normal soap.             6.  Wash thoroughly, paying special attention to the area where your surgery  will be performed.  7.  Thoroughly rinse your body with warm water  from the neck down.  8.  DO NOT shower/wash with your normal soap after using and rinsing off  the CHG Soap.                9.  Pat yourself dry with a clean towel.             10.  Wear clean pajamas.            11.  Place clean sheets on your bed the night of your first shower and do not  sleep with pets. Day of Surgery : Do not apply any lotions/deodorants the morning of  surgery.  Please wear clean clothes to the hospital/surgery center.  FAILURE TO FOLLOW THESE INSTRUCTIONS MAY RESULT IN THE CANCELLATION OF YOUR SURGERY PATIENT SIGNATURE_________________________________  NURSE SIGNATURE__________________________________  ________________________________________________________________________

## 2023-09-02 ENCOUNTER — Other Ambulatory Visit: Payer: Self-pay

## 2023-09-02 ENCOUNTER — Encounter (HOSPITAL_COMMUNITY)
Admission: RE | Admit: 2023-09-02 | Discharge: 2023-09-02 | Disposition: A | Discharge status: Home / Self Care | Source: Ambulatory Visit | Attending: Urology | Admitting: Urology

## 2023-09-02 ENCOUNTER — Encounter (HOSPITAL_COMMUNITY): Payer: Self-pay

## 2023-09-02 VITALS — BP 114/71 | HR 69 | Temp 98.2°F | Ht 73.5 in | Wt 183.0 lb

## 2023-09-02 DIAGNOSIS — I480 Paroxysmal atrial fibrillation: Secondary | ICD-10-CM | POA: Insufficient documentation

## 2023-09-02 DIAGNOSIS — E119 Type 2 diabetes mellitus without complications: Secondary | ICD-10-CM | POA: Diagnosis not present

## 2023-09-02 DIAGNOSIS — N401 Enlarged prostate with lower urinary tract symptoms: Secondary | ICD-10-CM | POA: Insufficient documentation

## 2023-09-02 DIAGNOSIS — Z87891 Personal history of nicotine dependence: Secondary | ICD-10-CM | POA: Insufficient documentation

## 2023-09-02 DIAGNOSIS — Z7901 Long term (current) use of anticoagulants: Secondary | ICD-10-CM | POA: Diagnosis not present

## 2023-09-02 DIAGNOSIS — I10 Essential (primary) hypertension: Secondary | ICD-10-CM | POA: Insufficient documentation

## 2023-09-02 DIAGNOSIS — R338 Other retention of urine: Secondary | ICD-10-CM | POA: Diagnosis not present

## 2023-09-02 DIAGNOSIS — Z01812 Encounter for preprocedural laboratory examination: Secondary | ICD-10-CM | POA: Insufficient documentation

## 2023-09-02 HISTORY — DX: Cardiac arrhythmia, unspecified: I49.9

## 2023-09-02 LAB — CBC
HCT: 43.4 % (ref 39.0–52.0)
Hemoglobin: 14.4 g/dL (ref 13.0–17.0)
MCH: 30.8 pg (ref 26.0–34.0)
MCHC: 33.2 g/dL (ref 30.0–36.0)
MCV: 92.7 fL (ref 80.0–100.0)
Platelets: 225 10*3/uL (ref 150–400)
RBC: 4.68 MIL/uL (ref 4.22–5.81)
RDW: 13.4 % (ref 11.5–15.5)
WBC: 6.3 10*3/uL (ref 4.0–10.5)
nRBC: 0 % (ref 0.0–0.2)

## 2023-09-02 LAB — GLUCOSE, CAPILLARY: Glucose-Capillary: 190 mg/dL — ABNORMAL HIGH (ref 70–99)

## 2023-09-02 LAB — BASIC METABOLIC PANEL WITH GFR
Anion gap: 8 (ref 5–15)
BUN: 16 mg/dL (ref 8–23)
CO2: 26 mmol/L (ref 22–32)
Calcium: 9.2 mg/dL (ref 8.9–10.3)
Chloride: 103 mmol/L (ref 98–111)
Creatinine, Ser: 0.7 mg/dL (ref 0.61–1.24)
GFR, Estimated: >60 mL/min (ref >60)
Glucose, Bld: 183 mg/dL — ABNORMAL HIGH (ref 70–99)
Potassium: 3.9 mmol/L (ref 3.5–5.1)
Sodium: 137 mmol/L (ref 135–145)

## 2023-09-02 LAB — HEMOGLOBIN A1C
Hgb A1c MFr Bld: 6.3 % — ABNORMAL HIGH (ref 4.8–5.6)
Mean Plasma Glucose: 134.11 mg/dL

## 2023-09-02 NOTE — Progress Notes (Addendum)
 For Anesthesia: PCP - Jeannine Milroy., MD  Cardiologist - Millicent Ally, MD LOV: 08/18/23 Clearance: Morey Ar, NP : 08/31/23 Bowel Prep reminder:  Chest x-ray -  EKG - 08/18/23 Stress Test -  ECHO - 07/16/21 Cardiac Cath - 1999 Pacemaker/ICD device last checked: Pacemaker orders received: Device Rep notified:  Spinal Cord Stimulator:N/A  Sleep Study - Yes CPAP - NO  Fasting Blood Sugar - N/A Checks Blood Sugar ___0__ times a day Date and result of last Hgb A1c-  Last dose of GLP1 agonist- Semaglutide : On hold since: 08/26/23 GLP1 instructions: To keep it on hold.  Last dose of SGLT-2 inhibitors- N/A SGLT-2 instructions:   Blood Thinner Instructions: Eliquis  will be hold after: 09/05/23. Doesn't need bridge with Lovenox. Aspirin Instructions: Last Dose:  Activity level: Can go up a flight of stairs and activities of daily living without stopping and without chest pain and/or shortness of breath   Able to exercise without chest pain and/or shortness of breath  Anesthesia review: Hx: HTN,PAfib,DIA,OSA(NO CPAP).  Patient denies shortness of breath, fever, cough and chest pain at PAT appointment   Patient verbalized understanding of instructions that were reviewed over the telephone.

## 2023-09-03 DIAGNOSIS — R351 Nocturia: Secondary | ICD-10-CM | POA: Diagnosis not present

## 2023-09-03 DIAGNOSIS — R197 Diarrhea, unspecified: Secondary | ICD-10-CM | POA: Diagnosis not present

## 2023-09-03 DIAGNOSIS — I10 Essential (primary) hypertension: Secondary | ICD-10-CM | POA: Diagnosis not present

## 2023-09-03 DIAGNOSIS — R972 Elevated prostate specific antigen [PSA]: Secondary | ICD-10-CM | POA: Diagnosis not present

## 2023-09-03 DIAGNOSIS — I480 Paroxysmal atrial fibrillation: Secondary | ICD-10-CM | POA: Diagnosis not present

## 2023-09-03 NOTE — Anesthesia Preprocedure Evaluation (Signed)
 Anesthesia Evaluation    Airway        Dental   Pulmonary former smoker          Cardiovascular hypertension,      Neuro/Psych    GI/Hepatic   Endo/Other  diabetes    Renal/GU      Musculoskeletal   Abdominal   Peds  Hematology   Anesthesia Other Findings   Reproductive/Obstetrics                             Anesthesia Physical Anesthesia Plan  ASA:   Anesthesia Plan:    Post-op Pain Management:    Induction:   PONV Risk Score and Plan:   Airway Management Planned:   Additional Equipment:   Intra-op Plan:   Post-operative Plan:   Informed Consent:   Plan Discussed with:   Anesthesia Plan Comments: (See PAT note 09/02/2023)       Anesthesia Quick Evaluation

## 2023-09-03 NOTE — Progress Notes (Signed)
 Anesthesia Chart Review   Case: [id number] Date/Time: 09/08/23 1145   Procedure: ABLATION, PROSTATE, TRANSURETHRAL, USING WATERJET   Anesthesia type: General   Diagnosis: Enlarged prostate with urinary retention [N40.1, R33.8]   Pre-op diagnosis: BENIGN PROSTATIC HYPERPLASIA   Location: [facility] ROOM 08 / [facility]   Surgeons: Christina Coyer, MD       DISCUSSION:83 y.o. former smoker with h/o sleep apnea, paroxysmal a-fib, HTN, DM II, BPH scheduled for above procedure 09/08/2023 with Dr. Christina Coyer.   Per cardiology preoperative evaluation 08/31/2023, Chart reviewed as part of pre-operative protocol coverage. Victor Sanchez was last seen on 08/18/2023 by Dr. Loetta Ringer.  He was doing well with no concerning cardiac complaints.  Spoke with patient via telephone today and he continues to do well.  He has not developed concerning cardiac symptoms.   Therefore, based on ACC/AHA guidelines, the patient would be an acceptable risk for the planned procedure without further cardiovascular testing.    Per Pharm D, patient may hold Eliquis  for 2 days prior to procedure. Patient will not need bridging with Lovenox around procedure.  Cardiac Cath 2016 with normal coronary arteries.   VS: BP 114/71   Pulse 69   Temp 36.8 C (Oral)   Ht 6' 1.5 (1.867 m)   Wt 83 kg   SpO2 98%   BMI 23.82 kg/m   PROVIDERS: Jeannine Milroy., MD is PCP   Cardiologist - Millicent Ally, MD  LABS: Labs reviewed: Acceptable for surgery. (all labs ordered are listed, but only abnormal results are displayed)  Labs Reviewed  HEMOGLOBIN A1C - Abnormal; Notable for the following components:      Result Value   Hgb A1c MFr Bld 6.3 (*)    All other components within normal limits  BASIC METABOLIC PANEL WITH GFR - Abnormal; Notable for the following components:   Glucose, Bld 183 (*)    All other components within normal limits  GLUCOSE, CAPILLARY - Abnormal; Notable for the following components:   Glucose-Capillary 190  (*)    All other components within normal limits  CBC     IMAGES:   EKG:   CV: Echo 07/16/2021 1. Left ventricular ejection fraction, by estimation, is 55 to 60%. The  left ventricle has normal function. The left ventricle has no regional  wall motion abnormalities. The left ventricular internal cavity size was  mildly dilated. Left ventricular  diastolic parameters were normal.   2. Right ventricular systolic function is normal. The right ventricular  size is normal. Tricuspid regurgitation signal is inadequate for assessing  PA pressure.   3. The mitral valve is grossly normal. Mild mitral valve regurgitation.  No evidence of mitral stenosis.   4. The aortic valve is grossly normal. Aortic valve regurgitation is  trivial. No aortic stenosis is present.   5. The inferior vena cava is normal in size with greater than 50%  respiratory variability, suggesting right atrial pressure of 3 mmHg.   Comparison(s): Report only 11/21/14 EF >55%.    Past Medical History:  Diagnosis Date   Diabetes mellitus without complication (HCC)    Dysrhythmia    Enlarged prostate    Headache(784.0)    occ.   History of asthma    as a child   Hyperlipidemia    Hypertension    Sleep apnea    2013    Past Surgical History:  Procedure Laterality Date   APPENDECTOMY  03/17/1966   CARDIAC CATHETERIZATION  09/06/1997   normal  LV function, normal coronaries (Dr. Magnus Schuller)   CHOLECYSTECTOMY  04/2022   KNEE ARTHROSCOPY  1990'   with baker's cyst excision unsure side   NASAL SEPTOPLASTY W/ TURBINOPLASTY Bilateral 05/25/2013   Procedure: NASAL SEPTOPLASTY WITH TURBINATE REDUCTION;  Surgeon: Lenton Rail, MD;  [facility];  Service: ENT;  Laterality: Bilateral;   NASOPHARYNGOSCOPY  03/17/1970   cyst removal   NM MYOCAR PERF WALL MOTION  03/17/2006   bruce myoview - mild-mod perfusion defect in basal inferoseptal walls with mild reversibility at rest; mild to mod defect in mid inferior  wall with mild reversibility at rest; EF 63%; intermediate risk scan   TONSILLECTOMY     as a child   TRANSTHORACIC ECHOCARDIOGRAM  03/17/2006   EF=>55%, mild conc LVH; borderline RV/RA enlargement; borderline MVP, prolapse of anterior leaflets, mild MR; mild TR; AV mildly sclerotic   UVULECTOMY N/A 05/25/2013   Procedure: Lynetta Saran ASSISTED;  Surgeon: Lenton Rail, MD;  Location: [facility] OR;  Service: ENT;  Laterality: N/A;   VENTRAL HERNIA REPAIR     x 2    MEDICATIONS:  apixaban  (ELIQUIS ) 5 MG TABS tablet   fenofibrate  (TRICOR ) 145 MG tablet   finasteride  (PROSCAR ) 5 MG tablet   metoprolol  succinate (TOPROL -XL) 50 MG 24 hr tablet   Multiple Vitamins-Minerals (MULTIVITAMIN WITH MINERALS) tablet   rosuvastatin  (CRESTOR ) 20 MG tablet   Semaglutide , 1 MG/DOSE, (OZEMPIC , 1 MG/DOSE,) 4 MG/3ML SOPN   tamsulosin  (FLOMAX ) 0.4 MG CAPS capsule   No current facility-administered medications for this encounter.    Chick Cotton Ward, PA-C [facility] Pre-Surgical Testing [phone]

## 2023-09-07 NOTE — H&P (Signed)
 Office Visit Report     08/24/2023   --------------------------------------------------------------------------------   Victor Sanchez. Berlinger  MRN: [id number]  DOB: 1940-08-10, 83 year old Male  SSN: [id number]   PRIMARY CARE:  Richard W. Tisovec, MD  PRIMARY CARE FAX:  [phone]  REFERRING:  Donnice FABIENE Brooks, MD  PROVIDER:  Donnice Brooks, M.D.  TREATING:  Ubaldo Eagles, NP  LOCATION:  [facility] [phone]     --------------------------------------------------------------------------------   CC/HPI: F/u -   1) BPH - s/p a 2021 Urolift x 7. On dual medical Rx. IPSS 15. Prostate volume 49 mL. Prostate was 97 g in 2007 on ultrasound. After 5 alpha reductase inhibitor his prostate was 49 g in 2021 with a prostatic urethral length of 4.9 cm. His prior PSA was 1.6 with a postvoid of about 240.   He might have been worse after the PUL. Doesn't feel empty and weak stream. AUASS = 24. He got back from [place] and limits fluids to deal with it. If he drinks more he has frequency and urgency. He gets turn key and standing triggers.   No ED.   Today, seen for the above. His Jul 2024 pUS = 75 g. PVR 428. UC HP/HF (10.9 cc/ sec). He complains of a weak stream and worse at night when he gets up to void. Cystoscopy -September 2024-lateral lobe hypertrophy with obstruction. Not a good channel from the PUL. About 1 cm intravesical extension with PUL clips visible but not encrusted-mostly submucosal. Moderate trabeculation.   08/24/2023: Here today to begin rescheduling for aqua ablation which was discussed in detail at time of last exam. He remains on tamsulosin  and finasteride . Also on Eliquis . IPSS is 20 but nocturia ranges anywhere from 2-5 times nightly. He has had no recent dysuria or gross hematuria. Mostly bothered by force of stream, urgency and occasional urge incontinence as well as his nocturia. He has had no recent dysuria or gross hematuria.     CC: AUA Questions  Scoring.  HPI: Victor Sanchez is a 83 year-old male established patient who is here AUA Questions.  Nocturia ranges 2-5 times nightly  Quality of life: 4     AUA Symptom Score: 50% of the time he has the sensation of not emptying his bladder completely when finished urinating. 50% of the time he has to urinate again fewer than two hours after he has finished urinating. More than 50% of the time he has to start and stop again several times when he urinates. 50% of the time he finds it difficult to postpone urination. More than 50% of the time he has a weak urinary stream. He never has to push or strain to begin urination. He has to get up to urinate 3 times from the time he goes to bed until the time he gets up in the morning.   Calculated AUA Symptom Score: 20    ALLERGIES: No Allergies    MEDICATIONS: Finasteride  5 MG Tablet 1 tablet PO Daily  Metoprolol  Succinate ER 50 MG Tablet Extended Release 24 Hour  Tamsulosin  HCl 0.4 MG Capsule  Eliquis  5 MG Tablet  Multivitamin  Ozempic  (1 MG/DOSE) 4 MG/3ML Solution Pen-injector 1 PO Daily  Rosuvastatin  Calcium  10 MG Tablet     GU PSH: Complex cystometrogram, with voiding pressure studies, any technique - 09/23/2022 Complex Uroflow - 09/23/2022, 2021 Cystoscopy - 11/24/2022, 2021 Emg surf Electrd - 09/23/2022 Rpr Umbil Hern; Reduc < 5 Yr - 2009 UROLIFT - 2021 UROLIFT -  September 25, 2019     NON-GU PSH: Appendectomy - September 25, 2007 Knee Arthroscopy; Dx - 25-Sep-2014 Remove Tonsils - Sep 25, 2007 Visit Complexity (formerly GPC1X) - 07/24/2022     GU PMH: BPH w/LUTS, We discussed a variety of options including continuing medical therapy, Rezum, Optilume balloon for BPH, aqua ablation. I discussed with the patient the nature r/b/a to AQB of prostate including side effects of the procedure, expected post-op course and likelihood of success. We discussed flow symptoms and irritative symptoms typically improve, but frequency and urgency can persist and rarely worsen. We also discussed risk  of bleeding, infection, stricture, sexual dysfunction and incontinence among others. We also discussed expectations for a staged or repeat procedure in the future. All questions answered. He elects to proceed with AQB. - 11/24/2022, - 09/23/2022, We discussed the nature r/b of surveillance, alpha blocker, 5ari, daily pde5i, OAB meds and procedures such as TURP, OBD, LVP, LEP, RWJ, RSLP in the OR or PUL or WVT in the office. He has some mild balance issues. Cant stop tams but also cant increase. Check urocuff, pUS and cysto - make a plan. wants to have a better stream and get off meds. , - 07/24/2022, Symptoms still quite bothersome but stable. On dual medical therapy, - 10/14/2021, Symptoms may be a bit better than before his Urolift but he is still on dual medical therapy., September 24, 2020, September 24, 2020, Pt experiencing significant LUTS but trending towards improvement. PVR shows an improved ability to empty his bladder. He continues on finasteride  and tamsulosin ., September 25, 2019, Sep 25, 2019, s/p Urolift w/ hematuria induced retention--improving, catheter out, 09/25/2019, September 25, 2019, 09/25/19, 25-Sep-2019, Stable/bothersome voiding complaints -- still interested in surgical intervention. Prostate US  shows prostate volume is appropriately small for uroloft; cysto today normal w/o median lobe., September 25, 2019, Stably bothersome urinary sx's despite being on dual medical therapy. He would like to pursue a treatment option such as urolift. Prostate does not feel excessively large on exam. , - Sep 25, 2019 (Stable), Stable exam, stable symptoms on dual medical therapy, - 09/24/17 (Stable), Stable and dual medical therapy., 09/24/16 (Worsening), He is having somewhatof increasing lower urinary tract symptoms that are bothersome. He requests a change in medication., 2015/09/25, Benign prostatic hyperplasia with urinary obstruction, - 09/25/14 Nocturia - 11/24/2022, - 09/23/2022, - 10/14/2021, September 24, 2020, Sep 24, 2020, 2019-09-25, Sep 25, 2019, Nocturia, - 09/25/14 Urinary Frequency - 09/23/2022, - 07/24/2022, - 10/14/2021, 09-24-20,  Sep 24, 2020, 2019-09-25 Urinary Urgency - 09/23/2022, 09-25-2019 Elevated PSA, PSA stable at 1.6, according to him from his last blood draw with Dr. Loreli - 10/14/2021, PSA a year ago 1.56. Finasteride  correction 3.2. , Sep 25, 2019, Due repeat measurement -- this is drawn per his PCP and we usually get records promptly. Prostate exam normal. , - 09/25/2019 (Stable), - Sep 24, 2017 (Stable), PSA is just over 1, - 25-Sep-2015, Elevated prostate specific antigen (PSA), - 25-Sep-2014 Gross hematuria - 2019/09/25 Urinary Retention, Unspec Sep 25, 2019, Incomplete bladder emptying, - 2012/09/24 Epididymitis - 09-25-2015    NON-GU PMH: Encounter for general adult medical examination without abnormal findings, Encounter for preventive health examination - Sep 24, 2013 Asthma, Asthma - Sep 24, 2012 Personal history of other endocrine, nutritional and metabolic disease, History of hypercholesterolemia - September 24, 2012    FAMILY HISTORY: Death In The Family Father - Father Death In The Family Mother - Mother Family Health Status Number - Runs In Family Urologic Disorder - Runs In Family   SOCIAL HISTORY: Marital Status: Married Preferred Language: English; Ethnicity: Not Hispanic Or Latino;  Race: White Current Smoking Status: Patient does not smoke anymore. Has not smoked since 10/16/1967.  Does not use smokeless tobacco. Drinks 1 drink per day. Types of alcohol  consumed: Wine, Liquor. Light Drinker.  Does not use drugs. Drinks 2 caffeinated drinks per day. Has not had a blood transfusion. 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    REVIEW OF SYSTEMS:    GU Review Male:   Patient reports frequent urination, hard to postpone urination, get up at night to urinate, leakage of urine, and stream starts and stops. Patient denies burning/ pain with urination, trouble starting your stream, have to strain to urinate , erection problems, and penile pain.  Gastrointestinal (Upper):   Patient denies nausea, vomiting, and indigestion/ heartburn.  Gastrointestinal (Lower):    Patient denies diarrhea and constipation.  Constitutional:   Patient denies fever, night sweats, weight loss, and fatigue.  Skin:   Patient denies skin rash/ lesion and itching.  Eyes:   Patient denies blurred vision and double vision.  Ears/ Nose/ Throat:   Patient denies sore throat and sinus problems.  Hematologic/Lymphatic:   Patient denies swollen glands and easy bruising.  Cardiovascular:   Patient denies leg swelling and chest pains.  Respiratory:   Patient denies cough and shortness of breath.  Endocrine:   Patient denies excessive thirst.  Musculoskeletal:   Patient denies back pain and joint pain.  Neurological:   Patient denies headaches and dizziness.  Psychologic:   Patient denies depression and anxiety.   VITAL SIGNS:      08/24/2023 02:40 PM  Weight 185 lb / 83.91 kg  Height 73 in / 185.42 cm  BP 102/58 mmHg  Pulse 63 /min  BMI 24.4 kg/m   MULTI-SYSTEM PHYSICAL EXAMINATION:    Constitutional: Well-nourished. No physical deformities. Normally developed. Good grooming.  Neck: Neck symmetrical, not swollen. Normal tracheal position.  Respiratory: No labored breathing, no use of accessory muscles.   Cardiovascular: Normal temperature, normal extremity pulses, no swelling, no varicosities.  Skin: No paleness, no jaundice, no cyanosis. No lesion, no ulcer, no rash.  Neurologic / Psychiatric: Oriented to time, oriented to place, oriented to person. No depression, no anxiety, no agitation.  Musculoskeletal: Normal gait and station of head and neck.     Complexity of Data:  Source Of History:  Patient, Medical Record Summary  Records Review:   AUA Symptom Score, Previous Doctor Records, Previous Hospital Records, Previous Patient Records  Urine Test Review:   Urinalysis  Urodynamics Review:   Review Bladder Scan, Review Flow Rate   04/21/18 03/12/18 02/09/17 02/15/16 09/12/14 02/15/14 02/03/13 08/29/11  PSA  Total PSA 1.560 ng/dl 1.50 ng/mL 1.459 ng/dl 1.19  7.88  6.21   7.22  2.67   Free PSA     0.54      % Free PSA     26        08/24/23  Urinalysis  Urine Appearance Clear   Urine Color Yellow   Urine Glucose Neg mg/dL  Urine Bilirubin Neg mg/dL  Urine Ketones Neg mg/dL  Urine Specific Gravity 1.015   Urine Blood Neg ery/uL  Urine pH 6.5   Urine Protein Neg mg/dL  Urine Urobilinogen 0.2 mg/dL  Urine Nitrites Neg   Urine Leukocyte Esterase Neg leu/uL   PROCEDURES:         PVR Ultrasound - 48201  Scanned Volume: 114 cc         Visit Complexity - H7788  Urinalysis Dipstick Dipstick Cont'd  Color: Yellow Bilirubin: Neg mg/dL  Appearance: Clear Ketones: Neg mg/dL  Specific Gravity: 8.984 Blood: Neg ery/uL  pH: 6.5 Protein: Neg mg/dL  Glucose: Neg mg/dL Urobilinogen: 0.2 mg/dL    Nitrites: Neg    Leukocyte Esterase: Neg leu/uL    ASSESSMENT:      ICD-10 Details  1 GU:   BPH w/LUTS - N40.1 Chronic, Stable  2   Urinary Frequency - R35.0 Chronic, Stable  3   Urinary Urgency - R39.15 Chronic, Stable  4   Nocturia - R35.1 Chronic, Stable   PLAN:           Orders Labs Urine Culture          Schedule Return Visit/Planned Activity: Next Available Appointment - Follow up MD, Schedule Surgery          Document Letter(s):  Created for Patient: Clinical Summary         Notes:   Dr. Nieves is aware, he is working on submitting scheduling sheet and other prior authorization paperwork for aqua ablation. He will need cardiac clearance to hold Eliquis . Timing of the procedure may be variable, patient has a pending trip to [place] in mid August. We discussed in detail the expected peri and postoperative course. All questions answered to the best my ability regarding aqua ablation with understanding expressed by the patient. He elects to proceed. Precautionary urine culture sent.   * Signed by Ubaldo Eagles, NP on 08/24/23 at 3:31 PM (EDT)*     ADD: Urine cx negative.

## 2023-09-08 ENCOUNTER — Other Ambulatory Visit: Payer: Self-pay

## 2023-09-08 ENCOUNTER — Encounter (HOSPITAL_COMMUNITY)
Admission: RE | Disposition: A | Discharge status: Home / Self Care | Payer: Self-pay | Source: Ambulatory Visit | Attending: Urology

## 2023-09-08 ENCOUNTER — Ambulatory Visit (HOSPITAL_COMMUNITY)
Admission: RE | Admit: 2023-09-08 | Discharge: 2023-09-08 | Disposition: A | Discharge status: Home / Self Care | Source: Ambulatory Visit | Attending: Urology | Admitting: Urology

## 2023-09-08 ENCOUNTER — Ambulatory Visit (HOSPITAL_COMMUNITY): Payer: Self-pay | Admitting: Physician Assistant

## 2023-09-08 ENCOUNTER — Ambulatory Visit (HOSPITAL_BASED_OUTPATIENT_CLINIC_OR_DEPARTMENT_OTHER): Payer: Self-pay | Admitting: Anesthesiology

## 2023-09-08 ENCOUNTER — Encounter (HOSPITAL_COMMUNITY): Payer: Self-pay | Admitting: Urology

## 2023-09-08 DIAGNOSIS — R350 Frequency of micturition: Secondary | ICD-10-CM | POA: Diagnosis not present

## 2023-09-08 DIAGNOSIS — R3912 Poor urinary stream: Secondary | ICD-10-CM

## 2023-09-08 DIAGNOSIS — N401 Enlarged prostate with lower urinary tract symptoms: Secondary | ICD-10-CM | POA: Insufficient documentation

## 2023-09-08 DIAGNOSIS — I480 Paroxysmal atrial fibrillation: Secondary | ICD-10-CM

## 2023-09-08 DIAGNOSIS — Z87891 Personal history of nicotine dependence: Secondary | ICD-10-CM | POA: Diagnosis not present

## 2023-09-08 DIAGNOSIS — I4891 Unspecified atrial fibrillation: Secondary | ICD-10-CM | POA: Insufficient documentation

## 2023-09-08 DIAGNOSIS — I10 Essential (primary) hypertension: Secondary | ICD-10-CM | POA: Diagnosis not present

## 2023-09-08 DIAGNOSIS — G4730 Sleep apnea, unspecified: Secondary | ICD-10-CM | POA: Insufficient documentation

## 2023-09-08 DIAGNOSIS — Z7901 Long term (current) use of anticoagulants: Secondary | ICD-10-CM | POA: Insufficient documentation

## 2023-09-08 DIAGNOSIS — E119 Type 2 diabetes mellitus without complications: Secondary | ICD-10-CM | POA: Diagnosis not present

## 2023-09-08 DIAGNOSIS — N138 Other obstructive and reflux uropathy: Secondary | ICD-10-CM

## 2023-09-08 LAB — GLUCOSE, CAPILLARY
Glucose-Capillary: 136 mg/dL — ABNORMAL HIGH (ref 70–99)
Glucose-Capillary: 109 mg/dL — ABNORMAL HIGH (ref 70–99)

## 2023-09-08 SURGERY — ABLATION, PROSTATE, TRANSURETHRAL, USING WATERJET
Anesthesia: General | Site: Prostate

## 2023-09-08 MED ORDER — 0.9 % SODIUM CHLORIDE (POUR BTL) OPTIME
TOPICAL | Status: DC | PRN
  Administered 2023-09-08: 1000 mL

## 2023-09-08 MED ORDER — EPHEDRINE 5 MG/ML INJ
INTRAVENOUS | Status: AC
  Filled 2023-09-08: qty 5

## 2023-09-08 MED ORDER — ONDANSETRON HCL 4 MG/2ML IJ SOLN
INTRAMUSCULAR | Status: DC | PRN
  Administered 2023-09-08: 4 mg via INTRAVENOUS

## 2023-09-08 MED ORDER — CHLORHEXIDINE GLUCONATE 0.12 % MT SOLN
15.0000 mL | Freq: Once | OROMUCOSAL | Status: AC
  Administered 2023-09-08: 15 mL via OROMUCOSAL

## 2023-09-08 MED ORDER — FENTANYL CITRATE (PF) 100 MCG/2ML IJ SOLN
INTRAMUSCULAR | Status: AC
Start: 2023-09-08 — End: 2023-09-08
  Filled 2023-09-08: qty 2

## 2023-09-08 MED ORDER — LIDOCAINE 2% (20 MG/ML) 5 ML SYRINGE
INTRAMUSCULAR | Status: DC | PRN
  Administered 2023-09-08: 100 mg via INTRAVENOUS

## 2023-09-08 MED ORDER — PHENYLEPHRINE 80 MCG/ML (10ML) SYRINGE FOR IV PUSH (FOR BLOOD PRESSURE SUPPORT)
PREFILLED_SYRINGE | INTRAVENOUS | Status: AC
Start: 2023-09-08 — End: 2023-09-08
  Filled 2023-09-08: qty 10

## 2023-09-08 MED ORDER — OXYCODONE HCL 5 MG/5ML PO SOLN: 5.0000 mg | Freq: Once | ORAL | Status: DC | PRN

## 2023-09-08 MED ORDER — FENTANYL CITRATE (PF) 100 MCG/2ML IJ SOLN
INTRAMUSCULAR | Status: DC | PRN
  Administered 2023-09-08: 100 ug via INTRAVENOUS
  Administered 2023-09-08: 50 ug via INTRAVENOUS

## 2023-09-08 MED ORDER — ONDANSETRON HCL 4 MG/2ML IJ SOLN: 4.0000 mg | Freq: Once | INTRAMUSCULAR | Status: DC | PRN

## 2023-09-08 MED ORDER — SODIUM CHLORIDE 0.9 % IR SOLN
Status: DC | PRN
  Administered 2023-09-08: 3000 mL via INTRAVESICAL

## 2023-09-08 MED ORDER — FENTANYL CITRATE PF 50 MCG/ML IJ SOSY: 25.0000 ug | PREFILLED_SYRINGE | INTRAMUSCULAR | Status: DC | PRN

## 2023-09-08 MED ORDER — SODIUM CHLORIDE 0.9 % IV SOLN
2.0000 g | INTRAVENOUS | Status: AC
  Administered 2023-09-08: 2 g via INTRAVENOUS
  Filled 2023-09-08: qty 20

## 2023-09-08 MED ORDER — FENTANYL CITRATE (PF) 100 MCG/2ML IJ SOLN
INTRAMUSCULAR | Status: AC
  Filled 2023-09-08: qty 2

## 2023-09-08 MED ORDER — FENTANYL CITRATE PF 50 MCG/ML IJ SOSY
PREFILLED_SYRINGE | INTRAMUSCULAR | Status: AC
  Filled 2023-09-08: qty 1

## 2023-09-08 MED ORDER — EPHEDRINE SULFATE-NACL 50-0.9 MG/10ML-% IV SOSY
PREFILLED_SYRINGE | INTRAVENOUS | Status: DC | PRN
  Administered 2023-09-08: 7.5 mg via INTRAVENOUS
  Administered 2023-09-08 (×6): 5 mg via INTRAVENOUS

## 2023-09-08 MED ORDER — ACETAMINOPHEN 500 MG PO TABS: 1000.0000 mg | ORAL_TABLET | Freq: Once | ORAL | Status: DC

## 2023-09-08 MED ORDER — ACETAMINOPHEN 500 MG PO TABS
1000.0000 mg | ORAL_TABLET | Freq: Once | ORAL | Status: AC
  Administered 2023-09-08: 1000 mg via ORAL
  Filled 2023-09-08: qty 2

## 2023-09-08 MED ORDER — MEPERIDINE HCL 50 MG/ML IJ SOLN: 6.2500 mg | INTRAMUSCULAR | Status: DC | PRN

## 2023-09-08 MED ORDER — SUGAMMADEX SODIUM 200 MG/2ML IV SOLN
INTRAVENOUS | Status: DC | PRN
  Administered 2023-09-08: 200 mg via INTRAVENOUS

## 2023-09-08 MED ORDER — LACTATED RINGERS IV SOLN: INTRAVENOUS | Status: DC

## 2023-09-08 MED ORDER — TRANEXAMIC ACID-NACL 1000-0.7 MG/100ML-% IV SOLN
1000.0000 mg | INTRAVENOUS | Status: AC
  Administered 2023-09-08: 1000 mg via INTRAVENOUS
  Filled 2023-09-08: qty 100

## 2023-09-08 MED ORDER — OXYCODONE HCL 5 MG PO TABS
5.0000 mg | ORAL_TABLET | Freq: Once | ORAL | Status: AC | PRN
  Administered 2023-09-08: 5 mg via ORAL

## 2023-09-08 MED ORDER — ORAL CARE MOUTH RINSE
15.0000 mL | Freq: Once | OROMUCOSAL | Status: AC
Start: 2023-09-08 — End: 2023-09-08

## 2023-09-08 MED ORDER — EPHEDRINE 5 MG/ML INJ
INTRAVENOUS | Status: AC
  Filled 2023-09-08: qty 10

## 2023-09-08 MED ORDER — OXYCODONE HCL 5 MG/5ML PO SOLN: 5.0000 mg | Freq: Once | ORAL | Status: AC | PRN

## 2023-09-08 MED ORDER — ROCURONIUM BROMIDE 10 MG/ML (PF) SYRINGE
PREFILLED_SYRINGE | INTRAVENOUS | Status: DC | PRN
  Administered 2023-09-08: 80 mg via INTRAVENOUS

## 2023-09-08 MED ORDER — INSULIN ASPART 100 UNIT/ML IJ SOLN: 0.0000 [IU] | INTRAMUSCULAR | Status: DC | PRN

## 2023-09-08 MED ORDER — PROPOFOL 10 MG/ML IV BOLUS
INTRAVENOUS | Status: DC | PRN
  Administered 2023-09-08: 150 mg via INTRAVENOUS

## 2023-09-08 MED ORDER — OXYCODONE HCL 5 MG PO TABS
ORAL_TABLET | ORAL | Status: AC
  Filled 2023-09-08: qty 1

## 2023-09-08 MED ORDER — APIXABAN 5 MG PO TABS: ORAL_TABLET | ORAL | 3 refills | Status: AC

## 2023-09-08 MED ORDER — DEXAMETHASONE SODIUM PHOSPHATE 10 MG/ML IJ SOLN
INTRAMUSCULAR | Status: DC | PRN
  Administered 2023-09-08: 4 mg via INTRAVENOUS

## 2023-09-08 MED ORDER — OXYCODONE HCL 5 MG PO TABS: 5.0000 mg | ORAL_TABLET | Freq: Once | ORAL | Status: DC | PRN

## 2023-09-08 SURGICAL SUPPLY — 26 items
BAG URINE DRAIN 2000ML AR STRL (UROLOGICAL SUPPLIES) ×1 IMPLANT
BAND RUBBER #18 3X1/16 STRL (MISCELLANEOUS) IMPLANT
CATH HEMA 3WAY 30CC 22FR COUDE (CATHETERS) IMPLANT
CATH HEMA 3WAY 30CC 24FR COUDE (CATHETERS) IMPLANT
COVER MAYO STAND STRL (DRAPES) ×1 IMPLANT
DRAPE FOOT SWITCH (DRAPES) ×1 IMPLANT
DRAPE SURG IRRIG POUCH 19X23 (DRAPES) IMPLANT
GEL ULTRASOUND 8.5O AQUASONIC (MISCELLANEOUS) ×1 IMPLANT
GLOVE SURG LX STRL 7.5 STRW (GLOVE) ×1 IMPLANT
GOWN STRL REUS W/ TWL XL LVL3 (GOWN DISPOSABLE) ×1 IMPLANT
HANDPIECE AQUABEAM (MISCELLANEOUS) ×1 IMPLANT
HOLDER FOLEY CATH W/STRAP (MISCELLANEOUS) IMPLANT
KIT TURNOVER KIT A (KITS) ×1 IMPLANT
LOOP CUT BIPOLAR 24F LRG (ELECTROSURGICAL) IMPLANT
MANIFOLD NEPTUNE II (INSTRUMENTS) ×1 IMPLANT
MAT ABSORB FLUID 56X50 GRAY (MISCELLANEOUS) ×1 IMPLANT
PACK CYSTO (CUSTOM PROCEDURE TRAY) ×1 IMPLANT
PACK DRAPE AQUABEAM (MISCELLANEOUS) ×1 IMPLANT
PAD PREP 24X48 CUFFED NSTRL (MISCELLANEOUS) ×1 IMPLANT
PIN SAFETY STERILE (MISCELLANEOUS) IMPLANT
SYR 30ML LL (SYRINGE) ×1 IMPLANT
SYRINGE TOOMEY IRRIG 70ML (MISCELLANEOUS) ×2 IMPLANT
TOWEL OR 17X26 10 PK STRL BLUE (TOWEL DISPOSABLE) ×1 IMPLANT
TUBING CONNECTING 10 (TUBING) ×2 IMPLANT
TUBING UROLOGY SET (TUBING) ×1 IMPLANT
UNDERPAD 30X36 HEAVY ABSORB (UNDERPADS AND DIAPERS) ×1 IMPLANT

## 2023-09-08 NOTE — Anesthesia Postprocedure Evaluation (Signed)
 Anesthesia Post Note  Patient: Victor Sanchez  Procedure(s) Performed: ABLATION, PROSTATE, TRANSURETHRAL, USING WATERJET (Prostate)     Patient location during evaluation: PACU Anesthesia Type: General Level of consciousness: awake and alert Pain management: pain level controlled Vital Signs Assessment: post-procedure vital signs reviewed and stable Respiratory status: spontaneous breathing, nonlabored ventilation, respiratory function stable and patient connected to nasal cannula oxygen Cardiovascular status: blood pressure returned to baseline and stable Postop Assessment: no apparent nausea or vomiting Anesthetic complications: no   No notable events documented.  Last Vitals:  Vitals:   09/08/23 1630 09/08/23 1648  BP: (!) 115/54 123/85  Pulse: 65 74  Resp: 17 15  Temp: 36.4 C 36.6 C  SpO2: 97% 100%    Last Pain:  Vitals:   09/08/23 1648  TempSrc:   PainSc: 3                  Cabot Cromartie

## 2023-09-08 NOTE — Interval H&P Note (Signed)
 History and Physical Interval Note:  09/08/2023 12:35 PM  Victor Sanchez  has presented today for surgery, with the diagnosis of BENIGN PROSTATIC HYPERPLASIA.  The various methods of treatment have been discussed with the patient and family. After consideration of risks, benefits and other options for treatment, the patient has consented to  Procedure(s): ABLATION, PROSTATE, TRANSURETHRAL, USING WATERJET (N/A) as a surgical intervention.  The patient's history has been reviewed, patient examined, no change in status, stable for surgery.  I have reviewed the patient's chart and labs.  Questions were answered to the patient's satisfaction. No dysuria or gross hematuria. No fever.     Donnice Brooks

## 2023-09-08 NOTE — Transfer of Care (Signed)
 Immediate Anesthesia Transfer of Care Note  Patient: Victor Sanchez  Procedure(s) Performed: Procedure(s): ABLATION, PROSTATE, TRANSURETHRAL, USING WATERJET (N/A)  Patient Location: PACU  Anesthesia Type:General  Level of Consciousness:  sedated, patient cooperative and responds to stimulation  Airway & Oxygen Therapy:Patient Spontanous Breathing and Patient connected to face mask oxgen  Post-op Assessment:  Report given to PACU RN and Post -op Vital signs reviewed and stable  Post vital signs:  Reviewed and stable  Last Vitals:  Vitals:   09/08/23 1030  BP: 109/74  Resp: 18  Temp: 37.2 C  SpO2: 96%    Complications: No apparent anesthesia complications

## 2023-09-08 NOTE — Op Note (Signed)
 Preoperative diagnosis: BPH with lower urinary tract symptoms, weak stream, frequency  Postoperative diagnosis: Same   Procedure: Robotic water  jet ablation of the prostate   Surgeon: Nieves   Anesthesia: General   Indication for procedure:   Findings:   Cystoscopy revealed lateral lobe obstruction and a high bladder neck. Three PUL urethral end pieces were encountered and removed. No other PUL UEPs were noted.   Description of procedure:  Victor Sanchez was brought to the operating room and placed supine on the operating table.  After adequate anesthesia Victor Sanchez was placed lithotomy position. Timeout was performed to confirm the patient and procedure. The TRUS Stepper was mounted to the Articulating Arm and secured to OR bed. The ultrasound probe was attached to the stepper. Exam under anesthesia was performed and the TRUS was inserted per rectum.  There was no resistance. The ultrasound probe was aligned, and confirmation made that the prostate is centered and aligned using both transverse and sagittal views. The bladder neck, verumontanum and the central/transition zones were identified.  Genitalia were prepped and draped in the usual sterile fashion. The 28F AQUABEAM Handpiece is inserted into the prostatic urethra and a complete cystoscopic evaluation was performed by inspecting the prostate, bladder, and identifying the location of the verumontanum/external sphincter. The AQUABEAM Handpiece was secured to the Handpiece Articulating Arm. Confirmed alignment of AQUABEAM Handpiece and TRUS Probe to be parallel and colinear. Confirmation that AQUABEAM nozzle is centered and anterior of the bladder neck or the median lobe. The cystoscope was then retracted to visualize the verumontanum and external sphincter and the cystoscope tip was positioned just proximal to the external sphincter. Reconfirmed alignment of the TRUS probe with the AQUABEAM Handpiece and compression applied with TRUS probe. Horizontal alignment  of the Handpiece waterjet nozzle was performed. The Aquablation treatment zones were planned utilizing real-time TRUS to visualize the contour of the prostate and the depth and radial angles of resection were defined in the transverse view. In the sagittal view, the AQUABEAM nozzle is identified and position registered with software. The treatment contours were then adjusted to conform to the intended resection margins. The median lobe, bladder neck and verumontanum were marked and confirmed in the treatment contour. The Aquablation Treatment was then started following the resection contour confirmed under ultrasound guidance. TOTAL AQUABLATION RESECTION TIME: first 3:22, second 3:00 Once Aquablation resection was complete the 24 French aqua beam handpiece was carefully removed.  The continuous-flow sheath with the visual obturator was passed and then the loop and handle.  The trigone and the ureteral orifices were identified.  Resection of some of the residual median lobe and bladder neck tissue was done.  The bladder neck was identified at 6:00 and this was taken up to 12:00 with fulguration of the bladder neck and prostate for hemostasis.  Slight amount of anterior tissue was resected.  Similarly from 6:00 up to 12:00 on the left side of the bladder neck was identified by resecting some of the ablated tissue to identify the bladder neck and cauterize any bleeding.  Some anterior tissue on the left was resected.  This created excellent hemostasis.  All the chips were evacuated.  Ureteral orifices again identified and noted to be normal without injury. The fossa was inspected and 3 PUL end pieces were noted and were excised and removed. The scope was backed out and a 22 [place] hematuria catheter was placed with 30 cc in the balloon.  The balloon was seated at the bladder neck and it was irrigated  on light traction and noted to be clear to pink.  Victor Sanchez was hooked up to CBI.  Victor Sanchez was cleaned up and placed supine.   Catheter was placed on traction.  Victor Sanchez was awakened and taken to the cover room in stable condition.  Complications: None  Blood loss: 50 mL  Specimens: None  Drains: 22 French three-way hematuria catheter with 30 cc in the balloon  Disposition: Patient stable to PACU

## 2023-09-08 NOTE — Discharge Instructions (Addendum)
 Robotic Water  Jet Ablation of the Prostate  Transurethral resection of the prostate (TURP) is the removal, or resection, of part of the prostate tissue. This procedure is done to treat an enlarged prostate gland (benign prostatic hyperplasia). The goal of TURP is to remove enough prostate tissue to allow for a normal flow of urine. The procedure will allow you to empty your bladder more completely when you urinate so that you can urinate less often. In a transurethral resection, a thin telescope with a light, a camera, and an electric cutting edge (resectoscope) is passed through the urethra and into the prostate. The opening of the urethra is at the end of the penis. Tell a health care provider about: Any allergies you have. All medicines you are taking, including vitamins, herbs, eye drops, creams, and over-the-counter medicines. Any problems you or family members have had with anesthetic medicines. Any bleeding problems you have. Any surgeries you have had. Any medical conditions you have. Any prostate infections you have had. What are the risks? Generally, this is a safe procedure. However, problems may occur, including: Infection. Bleeding. Allergic reactions to medicines. Blood in the urine (hematuria). Damage to nearby structures or organs. Other problems may occur, but they are rare. They include: Dry ejaculation, or having no semen come out during orgasm. Erectile dysfunction, or being unable to have or keep an erection. Scarring that leads to narrowing of the urethra. This narrowing may block the flow of urine. Inability to control when you urinate (incontinence). Deep vein thrombosis. This is a blood clot that can develop in your leg. TURP syndrome. This can happen when you lose too much sodium during or after the procedure. Some signs and symptoms of this condition include: Weakness. Headaches. Nausea or vomiting. Muscle cramping. What happens before the procedure? When to  stop eating and drinking Follow instructions from your health care provider about what you may eat and drink before your procedure. These may include: 8 hours before your procedure Stop eating most foods. Do not eat meat, fried foods, or fatty foods. Eat only light foods, such as toast or crackers. All liquids are okay except energy drinks and alcohol . 6 hours before your procedure Stop eating. Drink only clear liquids, such as water , clear fruit juice, black coffee, plain tea, and sports drinks. Do not drink energy drinks or alcohol . 2 hours before your procedure Stop drinking all liquids. You may be allowed to take medicines with small sips of water . If you do not follow your health care provider's instructions, your procedure may be delayed or canceled. Medicines Ask your health care provider about: Changing or stopping your regular medicines. This is especially important if you are taking diabetes medicines or blood thinners. Taking medicines such as aspirin and ibuprofen. These medicines can thin your blood. Do not take these medicines unless your health care provider tells you to take them. Taking over-the-counter medicines, vitamins, herbs, and supplements. Surgery safety Ask your health care provider what steps will be taken to help prevent infection. These steps may include: Removing hair at the surgery site. Washing skin with a germ-killing soap. Taking antibiotic medicine. General instructions Do not use any products that contain nicotine or tobacco for at least 4 weeks before the procedure. These products include cigarettes, chewing tobacco, and vaping devices, such as e-cigarettes. If you need help quitting, ask your health care provider. If you will be going home right after the procedure, plan to have a responsible adult: Take you home from the hospital  or clinic. You will not be allowed to drive. Care for you for the time you are told. What happens during the  procedure?  An IV will be inserted into one of your veins. You will be given one or more of the following: A medicine to help you relax (sedative). A medicine to make you fall asleep (general anesthetic). A medicine that is injected into your spine to numb the area below and slightly above the injection site (spinal anesthetic). Your legs will be placed in foot rests (stirrups) so that your legs are apart and your knees are bent. The resectoscope will be passed through your urethra to your prostate. Parts of your prostate will be resected using the cutting edge of the resectoscope. Fluid will be passed to rinse out the cut tissues (irrigation). The resectoscope will be removed. A small, thin tube (catheter) will be passed through your urethra and into your bladder. The catheter will drain urine into a bag outside of your body. The procedure may vary among health care providers and hospitals. What happens after the procedure? Your blood pressure, heart rate, breathing rate, and blood oxygen level will be monitored until you leave the hospital or clinic. You will be given fluids through the IV. The IV will be removed when you start eating and drinking normally. You may have some pain. Pain medicine will be available to help you. You will have a catheter draining your urine. You may have blood in your urine. Your catheter may be kept in until your urine is clear. Your urinary drainage will be monitored. If necessary, your bladder may be rinsed out (irrigated) through your catheter. You will be encouraged to walk around as soon as possible. You may have to wear compression stockings. These stockings help to prevent blood clots and reduce swelling in your legs. If you were given a sedative during the procedure, it can affect you for several hours. Do not drive or operate machinery until your health care provider says that it is safe. Summary Transurethral resection of the prostate (TURP) is the  removal (resection) of part of the prostate tissue. The goal of this procedure is to remove enough prostate tissue to allow for a normal flow of urine. Follow instructions from your health care provider about taking medicines and about eating and drinking before the procedure. This information is not intended to replace advice given to you by your health care provider. Make sure you discuss any questions you have with your health care provider. Document Revised: 11/27/2020 Document Reviewed: 11/27/2020 [employer] Patient Education  2024 [employer].

## 2023-09-08 NOTE — Anesthesia Procedure Notes (Signed)
 Procedure Name: Intubation Date/Time: 09/08/2023 1:08 PM  Performed by: Vincenzo Show, CRNAPre-anesthesia Checklist: Patient identified, Emergency Drugs available, Suction available, Patient being monitored and Timeout performed Patient Re-evaluated:Patient Re-evaluated prior to induction Oxygen Delivery Method: Circle system utilized Preoxygenation: Pre-oxygenation with 100% oxygen Induction Type: IV induction Ventilation: Mask ventilation without difficulty Laryngoscope Size: Mac and 4 Grade View: Grade II Tube type: Oral Tube size: 7.5 mm Number of attempts: 1 Airway Equipment and Method: Stylet Placement Confirmation: ETT inserted through vocal cords under direct vision, positive ETCO2, CO2 detector and breath sounds checked- equal and bilateral Secured at: 23 cm Tube secured with: Tape Dental Injury: Teeth and Oropharynx as per pre-operative assessment  Comments: ATOI

## 2023-09-11 DIAGNOSIS — N401 Enlarged prostate with lower urinary tract symptoms: Secondary | ICD-10-CM | POA: Diagnosis not present

## 2023-10-09 DIAGNOSIS — R351 Nocturia: Secondary | ICD-10-CM | POA: Diagnosis not present

## 2023-10-09 DIAGNOSIS — R350 Frequency of micturition: Secondary | ICD-10-CM | POA: Diagnosis not present

## 2023-10-09 DIAGNOSIS — N401 Enlarged prostate with lower urinary tract symptoms: Secondary | ICD-10-CM | POA: Diagnosis not present

## 2023-10-27 DIAGNOSIS — M545 Low back pain, unspecified: Secondary | ICD-10-CM | POA: Diagnosis not present

## 2023-11-12 DIAGNOSIS — M170 Bilateral primary osteoarthritis of knee: Secondary | ICD-10-CM | POA: Diagnosis not present

## 2023-11-13 DIAGNOSIS — D1801 Hemangioma of skin and subcutaneous tissue: Secondary | ICD-10-CM | POA: Diagnosis not present

## 2023-11-13 DIAGNOSIS — D0472 Carcinoma in situ of skin of left lower limb, including hip: Secondary | ICD-10-CM | POA: Diagnosis not present

## 2023-11-13 DIAGNOSIS — D0471 Carcinoma in situ of skin of right lower limb, including hip: Secondary | ICD-10-CM | POA: Diagnosis not present

## 2023-11-13 DIAGNOSIS — D485 Neoplasm of uncertain behavior of skin: Secondary | ICD-10-CM | POA: Diagnosis not present

## 2023-11-13 DIAGNOSIS — Z85828 Personal history of other malignant neoplasm of skin: Secondary | ICD-10-CM | POA: Diagnosis not present

## 2023-11-13 DIAGNOSIS — L814 Other melanin hyperpigmentation: Secondary | ICD-10-CM | POA: Diagnosis not present

## 2023-11-13 DIAGNOSIS — L821 Other seborrheic keratosis: Secondary | ICD-10-CM | POA: Diagnosis not present

## 2023-11-13 DIAGNOSIS — D225 Melanocytic nevi of trunk: Secondary | ICD-10-CM | POA: Diagnosis not present

## 2023-11-13 DIAGNOSIS — L570 Actinic keratosis: Secondary | ICD-10-CM | POA: Diagnosis not present

## 2023-11-19 ENCOUNTER — Other Ambulatory Visit: Payer: Self-pay | Admitting: Internal Medicine

## 2023-11-19 DIAGNOSIS — M170 Bilateral primary osteoarthritis of knee: Secondary | ICD-10-CM | POA: Diagnosis not present

## 2023-11-19 DIAGNOSIS — R2681 Unsteadiness on feet: Secondary | ICD-10-CM

## 2023-11-20 ENCOUNTER — Encounter: Payer: Self-pay | Admitting: Internal Medicine

## 2023-11-21 ENCOUNTER — Ambulatory Visit
Admission: RE | Admit: 2023-11-21 | Discharge: 2023-11-21 | Disposition: A | Discharge status: Home / Self Care | Source: Ambulatory Visit | Attending: Internal Medicine | Admitting: Internal Medicine

## 2023-11-21 DIAGNOSIS — R2681 Unsteadiness on feet: Secondary | ICD-10-CM | POA: Diagnosis not present

## 2023-11-21 DIAGNOSIS — R42 Dizziness and giddiness: Secondary | ICD-10-CM | POA: Diagnosis not present

## 2023-11-21 MED ORDER — GADOPICLENOL 0.5 MMOL/ML IV SOLN
9.0000 mL | Freq: Once | INTRAVENOUS | Status: AC | PRN
  Administered 2023-11-21: 9 mL via INTRAVENOUS

## 2023-11-23 DIAGNOSIS — L988 Other specified disorders of the skin and subcutaneous tissue: Secondary | ICD-10-CM | POA: Diagnosis not present

## 2023-11-26 DIAGNOSIS — M170 Bilateral primary osteoarthritis of knee: Secondary | ICD-10-CM | POA: Diagnosis not present
